# Patient Record
Sex: Female | Born: 1960
Health system: Southern US, Community
[De-identification: ages and names within clinical notes are randomized; demographics above are authoritative.]

## PROBLEM LIST (undated history)

## (undated) DIAGNOSIS — F40240 Claustrophobia: Secondary | ICD-10-CM

## (undated) DIAGNOSIS — J45909 Unspecified asthma, uncomplicated: Secondary | ICD-10-CM

## (undated) DIAGNOSIS — R112 Nausea with vomiting, unspecified: Secondary | ICD-10-CM

## (undated) DIAGNOSIS — E785 Hyperlipidemia, unspecified: Secondary | ICD-10-CM

## (undated) DIAGNOSIS — F329 Major depressive disorder, single episode, unspecified: Secondary | ICD-10-CM

## (undated) DIAGNOSIS — K219 Gastro-esophageal reflux disease without esophagitis: Secondary | ICD-10-CM

## (undated) DIAGNOSIS — J189 Pneumonia, unspecified organism: Secondary | ICD-10-CM

## (undated) DIAGNOSIS — F32A Depression, unspecified: Secondary | ICD-10-CM

## (undated) DIAGNOSIS — F419 Anxiety disorder, unspecified: Secondary | ICD-10-CM

## (undated) DIAGNOSIS — T7840XA Allergy, unspecified, initial encounter: Secondary | ICD-10-CM

## (undated) DIAGNOSIS — C50512 Malignant neoplasm of lower-outer quadrant of left female breast: Principal | ICD-10-CM

## (undated) DIAGNOSIS — IMO0001 Reserved for inherently not codable concepts without codable children: Secondary | ICD-10-CM

## (undated) DIAGNOSIS — G8929 Other chronic pain: Secondary | ICD-10-CM

## (undated) DIAGNOSIS — R51 Headache: Secondary | ICD-10-CM

## (undated) DIAGNOSIS — D649 Anemia, unspecified: Secondary | ICD-10-CM

## (undated) DIAGNOSIS — Z9889 Other specified postprocedural states: Secondary | ICD-10-CM

## (undated) DIAGNOSIS — I10 Essential (primary) hypertension: Secondary | ICD-10-CM

## (undated) HISTORY — PX: ABDOMINAL HYSTERECTOMY: SHX81

## (undated) HISTORY — DX: Hyperlipidemia, unspecified: E78.5

## (undated) HISTORY — DX: Anxiety disorder, unspecified: F41.9

## (undated) HISTORY — DX: Depression, unspecified: F32.A

## (undated) HISTORY — DX: Allergy, unspecified, initial encounter: T78.40XA

## (undated) HISTORY — DX: Claustrophobia: F40.240

## (undated) HISTORY — PX: OTHER SURGICAL HISTORY: SHX169

## (undated) HISTORY — DX: Pneumonia, unspecified organism: J18.9

## (undated) HISTORY — PX: VARICOSE VEIN SURGERY: SHX832

## (undated) HISTORY — DX: Essential (primary) hypertension: I10

## (undated) HISTORY — PX: GANGLION CYST EXCISION: SHX1691

## (undated) HISTORY — DX: Malignant neoplasm of lower-outer quadrant of left female breast: C50.512

## (undated) HISTORY — PX: TONSILLECTOMY: SUR1361

## (undated) HISTORY — DX: Other chronic pain: G89.29

## (undated) HISTORY — DX: Headache: R51

## (undated) HISTORY — DX: Major depressive disorder, single episode, unspecified: F32.9

---

## 1999-08-15 ENCOUNTER — Other Ambulatory Visit: Admission: RE | Admit: 1999-08-15 | Discharge: 1999-08-15 | Payer: Self-pay | Admitting: *Deleted

## 2000-10-27 ENCOUNTER — Encounter: Admission: RE | Admit: 2000-10-27 | Discharge: 2000-10-27 | Payer: Self-pay | Admitting: *Deleted

## 2000-11-09 ENCOUNTER — Other Ambulatory Visit: Admission: RE | Admit: 2000-11-09 | Discharge: 2000-11-09 | Payer: Self-pay | Admitting: *Deleted

## 2002-01-27 ENCOUNTER — Other Ambulatory Visit: Admission: RE | Admit: 2002-01-27 | Discharge: 2002-01-27 | Payer: Self-pay | Admitting: *Deleted

## 2002-12-21 ENCOUNTER — Encounter: Payer: Self-pay | Admitting: Gastroenterology

## 2002-12-21 ENCOUNTER — Encounter: Admission: RE | Admit: 2002-12-21 | Discharge: 2002-12-21 | Payer: Self-pay | Admitting: Gastroenterology

## 2003-02-06 ENCOUNTER — Other Ambulatory Visit: Admission: RE | Admit: 2003-02-06 | Discharge: 2003-02-06 | Payer: Self-pay | Admitting: *Deleted

## 2004-11-21 ENCOUNTER — Ambulatory Visit: Payer: Self-pay | Admitting: Internal Medicine

## 2004-12-03 ENCOUNTER — Ambulatory Visit: Payer: Self-pay | Admitting: Internal Medicine

## 2004-12-06 ENCOUNTER — Ambulatory Visit: Payer: Self-pay

## 2006-12-15 ENCOUNTER — Ambulatory Visit: Payer: Self-pay | Admitting: Internal Medicine

## 2006-12-15 DIAGNOSIS — F419 Anxiety disorder, unspecified: Secondary | ICD-10-CM | POA: Insufficient documentation

## 2006-12-15 DIAGNOSIS — N318 Other neuromuscular dysfunction of bladder: Secondary | ICD-10-CM | POA: Insufficient documentation

## 2007-06-28 ENCOUNTER — Telehealth (INDEPENDENT_AMBULATORY_CARE_PROVIDER_SITE_OTHER): Payer: Self-pay | Admitting: *Deleted

## 2007-06-30 ENCOUNTER — Ambulatory Visit: Payer: Self-pay | Admitting: Internal Medicine

## 2007-07-05 ENCOUNTER — Telehealth (INDEPENDENT_AMBULATORY_CARE_PROVIDER_SITE_OTHER): Payer: Self-pay | Admitting: *Deleted

## 2007-07-12 ENCOUNTER — Encounter (INDEPENDENT_AMBULATORY_CARE_PROVIDER_SITE_OTHER): Payer: Self-pay | Admitting: *Deleted

## 2007-07-13 ENCOUNTER — Telehealth (INDEPENDENT_AMBULATORY_CARE_PROVIDER_SITE_OTHER): Payer: Self-pay | Admitting: *Deleted

## 2007-07-19 ENCOUNTER — Encounter (INDEPENDENT_AMBULATORY_CARE_PROVIDER_SITE_OTHER): Payer: Self-pay | Admitting: *Deleted

## 2007-07-19 ENCOUNTER — Ambulatory Visit: Payer: Self-pay | Admitting: Internal Medicine

## 2007-07-19 LAB — CONVERTED CEMR LAB
OCCULT 1: NEGATIVE
OCCULT 2: NEGATIVE
OCCULT 3: NEGATIVE

## 2007-09-02 ENCOUNTER — Ambulatory Visit (HOSPITAL_BASED_OUTPATIENT_CLINIC_OR_DEPARTMENT_OTHER): Admission: RE | Admit: 2007-09-02 | Discharge: 2007-09-02 | Payer: Self-pay | Admitting: Obstetrics and Gynecology

## 2007-09-02 ENCOUNTER — Encounter (INDEPENDENT_AMBULATORY_CARE_PROVIDER_SITE_OTHER): Payer: Self-pay | Admitting: Obstetrics and Gynecology

## 2007-10-07 ENCOUNTER — Encounter (INDEPENDENT_AMBULATORY_CARE_PROVIDER_SITE_OTHER): Payer: Self-pay | Admitting: Obstetrics and Gynecology

## 2007-10-07 ENCOUNTER — Ambulatory Visit (HOSPITAL_COMMUNITY): Admission: RE | Admit: 2007-10-07 | Discharge: 2007-10-08 | Payer: Self-pay | Admitting: Obstetrics and Gynecology

## 2008-07-13 ENCOUNTER — Encounter: Payer: Self-pay | Admitting: Internal Medicine

## 2008-08-15 ENCOUNTER — Encounter (INDEPENDENT_AMBULATORY_CARE_PROVIDER_SITE_OTHER): Payer: Self-pay | Admitting: *Deleted

## 2008-08-24 ENCOUNTER — Telehealth (INDEPENDENT_AMBULATORY_CARE_PROVIDER_SITE_OTHER): Payer: Self-pay | Admitting: *Deleted

## 2008-08-30 ENCOUNTER — Encounter (INDEPENDENT_AMBULATORY_CARE_PROVIDER_SITE_OTHER): Payer: Self-pay | Admitting: *Deleted

## 2008-09-06 ENCOUNTER — Encounter: Payer: Self-pay | Admitting: Internal Medicine

## 2008-09-29 ENCOUNTER — Ambulatory Visit: Payer: Self-pay | Admitting: Internal Medicine

## 2009-10-08 ENCOUNTER — Encounter (INDEPENDENT_AMBULATORY_CARE_PROVIDER_SITE_OTHER): Payer: Self-pay | Admitting: *Deleted

## 2010-04-16 ENCOUNTER — Ambulatory Visit
Admission: RE | Admit: 2010-04-16 | Discharge: 2010-04-16 | Payer: Self-pay | Source: Home / Self Care | Attending: Internal Medicine | Admitting: Internal Medicine

## 2010-04-16 ENCOUNTER — Encounter: Payer: Self-pay | Admitting: Internal Medicine

## 2010-04-16 DIAGNOSIS — E785 Hyperlipidemia, unspecified: Secondary | ICD-10-CM | POA: Insufficient documentation

## 2010-04-16 DIAGNOSIS — R030 Elevated blood-pressure reading, without diagnosis of hypertension: Secondary | ICD-10-CM | POA: Insufficient documentation

## 2010-04-16 LAB — CONVERTED CEMR LAB
Cholesterol, target level: 200 mg/dL
HDL goal, serum: 40 mg/dL
LDL Goal: 160 mg/dL

## 2010-04-23 ENCOUNTER — Ambulatory Visit: Admit: 2010-04-23 | Payer: Self-pay | Admitting: Internal Medicine

## 2010-04-28 LAB — CONVERTED CEMR LAB
ALT: 12 units/L (ref 0–35)
AST: 20 units/L (ref 0–37)
Albumin: 3.8 g/dL (ref 3.5–5.2)
Alkaline Phosphatase: 62 units/L (ref 39–117)
BUN: 11 mg/dL (ref 6–23)
Basophils Absolute: 0 10*3/uL (ref 0.0–0.1)
Basophils Relative: 0.5 % (ref 0.0–1.0)
Bilirubin, Direct: 0.1 mg/dL (ref 0.0–0.3)
CO2: 30 meq/L (ref 19–32)
Calcium: 8.8 mg/dL (ref 8.4–10.5)
Chloride: 106 meq/L (ref 96–112)
Creatinine, Ser: 0.8 mg/dL (ref 0.4–1.2)
Eosinophils Absolute: 0.1 10*3/uL (ref 0.0–0.7)
Eosinophils Relative: 1.4 % (ref 0.0–5.0)
GFR calc Af Amer: 98 mL/min
GFR calc non Af Amer: 81 mL/min
Glucose, Bld: 88 mg/dL (ref 70–99)
HCT: 36.8 % (ref 36.0–46.0)
Hemoglobin: 11.6 g/dL — ABNORMAL LOW (ref 12.0–15.0)
Iron: 22 ug/dL — ABNORMAL LOW (ref 42–145)
Lymphocytes Relative: 30.1 % (ref 12.0–46.0)
MCHC: 31.4 g/dL (ref 30.0–36.0)
MCV: 80.7 fL (ref 78.0–100.0)
Monocytes Absolute: 0.3 10*3/uL (ref 0.1–1.0)
Monocytes Relative: 6.2 % (ref 3.0–12.0)
Neutro Abs: 2.7 10*3/uL (ref 1.4–7.7)
Neutrophils Relative %: 61.8 % (ref 43.0–77.0)
Platelets: 234 10*3/uL (ref 150–400)
Potassium: 4.8 meq/L (ref 3.5–5.1)
RBC: 4.56 M/uL (ref 3.87–5.11)
RDW: 14.4 % (ref 11.5–14.6)
Saturation Ratios: 4.4 % — ABNORMAL LOW (ref 20.0–50.0)
Sodium: 141 meq/L (ref 135–145)
Total Bilirubin: 0.6 mg/dL (ref 0.3–1.2)
Total Protein: 7.2 g/dL (ref 6.0–8.3)
Transferrin: 358 mg/dL (ref >=212.0)
WBC: 4.4 10*3/uL — ABNORMAL LOW (ref 4.5–10.5)

## 2010-05-02 NOTE — Assessment & Plan Note (Signed)
Summary: cpx/cbs   Vital Signs:  Patient profile:   50 year old female Weight:      246 pounds Temp:     98.1 degrees F oral Pulse rate:   80 / minute Resp:     16 per minute BP sitting:   120 / 88  (left arm) Cuff size:   large  Vitals Entered By: Shonna Chock CMA (April 16, 2010 1:14 PM) CC: CPX , Lipid Management   CC:  CPX  and Lipid Management.  History of Present Illness:    Jennifer Park is here for a physical; she has had gas symptoms responsive to [employer].    Hypertension Follow-Up:  The patient reports edema, but denies lightheadedness, urinary frequency, headaches, and fatigue.  The patient denies the following associated symptoms: chest pain, chest pressure, exercise intolerance, dyspnea, palpitations, and syncope.  Compliance with medications (by patient report) has been near 100%.  The patient reports that dietary compliance has been good.  The patient reports exercising 3-4X per week.  Adjunctive measures currently used by the patient include  modified salt restriction.    Lipid Management History:      Positive NCEP/ATP III risk factors include hypertension.  Negative NCEP/ATP III risk factors include female age less than 75 years old, no history of early menopause without estrogen hormone replacement, non-diabetic, no family history for ischemic heart disease, non-tobacco-user status, no ASHD (atherosclerotic heart disease), no prior stroke/TIA, no peripheral vascular disease, and no history of aortic aneurysm.     Current Medications (verified): 1)  Sertraline Hcl 100 Mg  Tabs (Sertraline Hcl) .Marland Kitchen.. 1 Once Daily 2)  Oxybutynin Chloride 5 Mg  Tabs (Oxybutynin Chloride) .Marland Kitchen.. 1 By Mouth Two Times A Day As Needed **appointment Over-Due** 3)  Excedrin Migraine 4)  Benadryl Children Prn 5)  Amlodipine Besylate 5 Mg  Tabs (Amlodipine Besylate) .Marland Kitchen.. 1 By Mouth Once Daily**office Visit Due Now*** 6)  Acyclovir 200 Mg Caps (Acyclovir) .... Take Two Tablets Per Day 7)  Probiotic  .Marland Kitchen.. 1 By Mouth Once Daily  Allergies: 1)  ! Benazepril Hcl (Benazepril Hcl) 2)  Morphine  Past History:  Past Medical History: Lactose intolerance ANXIETY STATE NOS (ICD-300.00)  , stable with Setraline DYSFUNCTION, BLADDER NEC (ICD-596.59) Hypertension Hyperlipidemia: NMR Lipoprofile 2006: LDL 100(1098/415), HDL 52, TG 70. LDL goal = <120. Framingham Study LDL goal=< 160.  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         New lesion OD lower lid 2 months ago. Psych:  Denies anxiety, depression, easily angered, easily tearful, and irritability.  Physical Exam  General:  well-nourished;alert,appropriate and cooperative throughout examination Head:  Normocephalic and atraumatic without obvious abnormalities.  Eyes:  No corneal or conjunctival inflammation noted. Perrla. Funduscopic exam benign, without hemorrhages, exudates or papilledema. Papilloma OD lower lid  Ears:  External ear exam shows no significant lesions or deformities.  Otoscopic examination reveals clear canals, tympanic membranes are intact bilaterally without bulging, retraction, inflammation or discharge. Hearing is grossly normal bilaterally. Nose:  External nasal  examination shows no deformity or inflammation. Nasal mucosa are pink and moist without lesions or exudates. Mouth:  Oral mucosa and oropharynx without lesions or exudates.  Teeth in good repair. Neck:  No deformities, masses, or tenderness noted. Lungs:  Normal respiratory effort, chest expands symmetrically. Lungs are clear to auscultation, no crackles or wheezes. Heart:  normal rate, regular rhythm, no gallop, no rub, no JVD, no HJR, and grade 1/2 - 1  /6 systolic murmur.   Abdomen:  Bowel sounds positive,abdomen soft and non-tender without masses, organomegaly or hernias noted. Genitalia:  Dr Rosalio Macadamia  Msk:  No deformity or scoliosis noted of thoracic or lumbar spine.   Pulses:  R and L carotid,radial,femoral,dorsalis pedis and posterior tibial pulses are full and equal bilaterally Extremities:  No clubbing, cyanosis, edema, or deformity noted with normal full range of motion of all joints.  Varicose veins over feet.  Neurologic:  alert & oriented X3 and DTRs symmetrical and normal.   Skin:  Intact without suspicious lesions or rashes Cervical Nodes:  No lymphadenopathy noted Axillary Nodes:  No palpable lymphadenopathy Psych:  memory intact for recent and remote, normally interactive, and good eye contact.     Impression & Recommendations:  Problem # 1:  ROUTINE GENERAL MEDICAL EXAM@HEALTH  CARE FACL (ICD-V70.0)  Orders: EKG w/ Interpretation (93000)  Problem # 2:  HYPERTENSION (ICD-401.9) Trace edema The following medications were removed from the medication list:    Amlodipine Besylate 5 Mg Tabs (Amlodipine besylate) .Marland Kitchen... 1 by mouth once daily Her updated medication list for this problem includes:    Losartan Potassium-hctz 50-12.5 Mg Tabs (Losartan potassium-hctz) .Marland Kitchen... 1 once daily  Problem # 3:  HYPERLIPIDEMIA (ICD-272.4)  Complete Medication List: 1)  Sertraline Hcl 100 Mg Tabs (Sertraline hcl) .Marland Kitchen.. 1 once daily 2)  Oxybutynin Chloride 5 Mg Tabs (Oxybutynin chloride)  .Marland Kitchen.. 1 by mouth two times a day as needed 3)  Excedrin Migraine  4)  Benadryl Children Prn  5)  Acyclovir 200 Mg Caps (Acyclovir) .... Take two tablets per day 6)  Probiotic  .Marland Kitchen.. 1 by mouth once daily 7)  Losartan Potassium-hctz 50-12.5 Mg Tabs (Losartan potassium-hctz) .Marland Kitchen.. 1 once daily  Lipid Assessment/Plan:      Based on NCEP/ATP III, the patient's risk factor category is "2 or more risk factors and a calculated 10 year CAD risk of > 20%".  The patient's lipid goals are as follows: Total cholesterol goal is 200; LDL cholesterol goal is 160; HDL cholesterol goal is 40; Triglyceride goal is 150.    Patient Instructions: 1)  Consider fasting labs: 2)  BMP ; 3)  Hepatic Panel ; 4)  Lipid Panel ; 5)  TSH ; 6)  CBC w/ Diff.  7)  Check your Blood Pressure regularly. Call if it is above: 135/85 ON AVERAGE  Prescriptions: OXYBUTYNIN CHLORIDE 5 MG  TABS (OXYBUTYNIN CHLORIDE) 1 by mouth two times a day as needed  #30 x 11   Entered and Authorized by:   Marga Melnick MD   Signed by:   Marga Melnick MD on 04/16/2010   Method used:   Print then Give to Patient   RxID:   [id number] SERTRALINE HCL 100 MG  TABS (SERTRALINE HCL) 1 once daily  #30 Tablet x 11   Entered and Authorized by:   Marga Melnick MD   Signed  by:   Marga Melnick MD on 04/16/2010   Method used:   Print then Give to Patient   RxID:   [id number] LOSARTAN POTASSIUM-HCTZ 50-12.5 MG TABS (LOSARTAN POTASSIUM-HCTZ) 1 once daily  #30 x 11   Entered and Authorized by:   Marga Melnick MD   Signed by:   Marga Melnick MD on 04/16/2010   Method used:   Print then Give to Patient   RxID:   [id number]    Orders Added: 1)  Est. Patient 40-64 years [99396] 2)  EKG w/ Interpretation [93000]   Immunization History:  Tetanus/Td Immunization History:    Tetanus/Td:  historical (03/09/2007)   Immunization History:  Tetanus/Td Immunization History:    Tetanus/Td:  Historical (03/09/2007)

## 2010-05-02 NOTE — Letter (Signed)
Summary: Primary Care Appointment Letter  [facility] [place]  [address] [place], [place] [address]   Phone: [phone]  Fax: [phone]    10/08/2009 MRN: [id number]  CALINDA STOCKINGER [address] [place], [place]  [address]  Dear Ms. Alona Bene,   Your Primary Care Physician Marga Melnick MD has indicated that:    ___X____it is time to schedule an appointment (Please call to schedule a yearly physical-30 min appointment).    _______you missed your appointment on______ and need to call and          reschedule.    _______you need to have lab work done.    _______you need to schedule an appointment discuss lab or test results.    _______you need to call to reschedule your appointment that is                       scheduled on _________.     Please call our office as soon as possible. Our phone number is [phone]. Please press option 1. Our office is open 8a-5p, Monday through Friday.     Thank you,    [facility]

## 2010-05-17 ENCOUNTER — Encounter: Payer: Self-pay | Admitting: Internal Medicine

## 2010-06-06 NOTE — Letter (Signed)
Summary: [facility]  [facility]   Imported By: Maryln Gottron 05/31/2010 14:20:37  _____________________________________________________________________  External Attachment:    Type:   Image     Comment:   External Document

## 2010-08-13 NOTE — Op Note (Signed)
Jennifer Park, Jennifer Park                  ACCOUNT NO.:  [id number]   MEDICAL RECORD NO.:  [id number]          PATIENT TYPE:  OIB   LOCATION:  9315                          FACILITY:  [facility]   PHYSICIAN:  Sherry A. Dickstein, M.D.DATE OF BIRTH:  1961-03-03   DATE OF PROCEDURE:  10/07/2007  DATE OF DISCHARGE:                               OPERATIVE REPORT   PREOPERATIVE DIAGNOSIS:  Complex atypical endometrial hyperplasia.   POSTOPERATIVE DIAGNOSIS:  Complex atypical endometrial hyperplasia.   PROCEDURE:  Total laparoscopic hysterectomy.   SURGEON:  Sherry A. Rosalio Macadamia, MD   ASSISTANT:  Chester Holstein. Earlene Plater, MD.   ANESTHESIA:  General.   INDICATIONS:  This is a 50 year old G2, P1-0-1-1 woman, who had some  irregular heavy bleeding.  The patient had a workup which included an  ultrasound/sonohysterogram showing endometrial polyps.  The patient went  to have a D&C, hysteroscopy, resectoscope, removing these polys.  Pathology returned, showing complex atypical endometrial hyperplasia.  Pathologists could not completely rule out endometrial cancer, although  no cancer was seen because of this the patient is brought to the  operating room for hysterectomy.  The patient requests retaining her  ovaries since no cancer was present.   FINDINGS:  Normal-sized anteflexed uterus sounding to 10 cm.  Normal  fallopian tubes and ovaries.   PROCEDURE:  The patient was brought into the operating room and was  given adequate general anesthesia.  She was placed in dorsal lithotomy  position.  The pelvis was irrigated with Ringer's lactate for pelvic  washings.  Her abdomen and then her perineum washed with Betadine.  Her  vagina was washed with Betadine.  Foley catheter was placed in the  vagina and a weighted speculum placed in the vagina.  The cervix was  grasped with a single-toothed tenaculum and the uterus was sounded.  The  RUMI roomy was placed in the standard fashion with 10 cm tip.  The  balloons  were inflated, surgeon's gown and gloves were changed.  The  patient was draped in sterile fashion.  The subumbilical area was  infiltrated with 0.25% Marcaine.  Incision was made.  Fascia was  identified.  Fascia was incised sharply with 0 Vicryl stitch was taken  in 4 quadrants.  The peritoneum was entered bluntly.  The Hasson was  placed into the peritoneal cavity and cinched down with the Vicryl  stitches.  The carbon dioxide was insufflated.  The pelvis was inspected  and the pictures were obtained.  Using the harmonic scalpel and the  Gyrus bipolar cautery, and the round ligaments, and utero-ovarian  ligaments were cauterized on both sides.  The anterior leaf of the broad  ligament and the anterior leaf of the bladder flap was cauterized and  cut to be able to develop a bladder flap.  Once the uterine arteries  were then well identified, they were cauterized with bipolar cautery in  many different places over a course approximately 2 cm and then cut in  the middle.  The posterior serosa at the level of uterosacral ligaments  was cauterized and cut using the  harmonic scalpel.  The cervix was  developed using blunt and sharp dissection and cautery to develop the  bladder flap off of the cervix.  Once cervix was well palpated at the  level of the cup, then the vagina was cut into at the level above the  cervix using the harmonic scalpel.  The vaginal cuff was then cut  circumferentially in this fashion right at the level of the cup and once  the lateral tissues were identified.  This was actually cut using some  bipolar cautery instead.  We using the harmonic scalpel posteriorly.  Once this was done, the entire specimen was detached from the vagina.  The specimen was attempted to be removed using the RUMI; however,  detached.  Therefore, the specimen was removed by one of the surgeon was  going vaginally and then attaching single-tooth tenaculum on the cervix  and with some pressure and  the uterus was able to be pulled into the  vagina.  Surgeon's gloves were changed and the abdomen was reinspected.  The pelvis was irrigated.  There was no significant bleeding present.  Using Vicryl ratcheted suture, the angles of the vagina were first  closed and then using the same suture the vagina was closed in a running  stitch.  Adequate closure was present.  The distance between the  stitches was checked and then no probe could be passed between the  stitches.  Adequate hemostasis was present.  The pelvis was irrigated.  The ovaries were reinspected and felt to be normal.  There had been a  small amount of endometriosis present in the cul-de-sac but this was not  felt to be significant.  All cauterized areas for major blood vessels  were reinspected and there was no significant bleeding.  The pelvis was  irrigated.  All carbon dioxide was allowed to escape after the ports had  been removed.  The subumbilical fascia was closed around the digit to  make sure that no bowel was trapped on closure.  The subumbilical skin  was closed with 4-0 Vicryl in subcuticular running stitch.  Three  incisions that had been placed, 2 had been placed in the initial part of  the surgery on the right hand side of the abdomen under direct  visualization with 5 mm ports and 1 had been placed on the left side of  the abdomen under direct visualization with a 5 mm port, those 2 were  closed with Dermabond.  There was felt that adequate hemostasis was  present everywhere.  The patient was taken out of dorsal lithotomy  position after removing the uterus from the vagina.  The patient was  awakened.  She was extubated.  She was moved from the operating table to  a stretcher in stable condition.  Complications were none.  Estimated  blood loss less than 100 mL.  Specimen was uterus with cervix present.  This went to pathology.      Sherry A. Rosalio Macadamia, M.D.  Electronically Signed     SAD/MEDQ  D:   10/07/2007  T:  10/08/2007  Job:  [id number]

## 2010-08-13 NOTE — Op Note (Signed)
NAMECYPRESS, HINKSON                  ACCOUNT NO.:  [id number]   MEDICAL RECORD NO.:  [id number]          PATIENT TYPE:  AMB   LOCATION:  [facility]                         FACILITY:  [facility]   PHYSICIAN:  Sherry A. Dickstein, M.D.DATE OF BIRTH:  1960/08/14   DATE OF PROCEDURE:  09/02/2007  DATE OF DISCHARGE:                               OPERATIVE REPORT   PREOPERATIVE DIAGNOSES:  1. Menorrhagia.  2. Submucosal fibroids.   POSTOPERATIVE DIAGNOSES:  1. Menorrhagia.  2. Submucosal fibroids.  3. Endometrial polyps.   PROCEDURE:  Dilatation and curettage hysteroscopy with resectoscope.   SURGEON:  Sherry A. Rosalio Macadamia, M.D.   ANESTHESIA:  MAC.   INDICATIONS:  This is a 50 year old G2, P1-0-1-1, woman who has monthly  menstrual cycles which are excessively heavy.  The patient has large  clots and she bleeds for 3-6 days, 3 of them excessively heavy.  The  patient had an ultrasound which revealed a thickened endometrium that  she had a repeat a sonohysterogram which showed 2 submucosal fibroids  present.  Because of this the patient is brought to the operating room  for [facility] hysteroscopy with resectoscope.   FINDINGS:  A slightly enlarged, anteflexed uterus.  No adnexal mass.  Multiple endometrial polyps and several submucosal fibroids.   PROCEDURE:  The patient was brought to the operating room and given  adequate IV sedation.  She was placed in a dorsal lithotomy position.  Her perineum was washed with Betadine prep, pelvic examination was  performed.  Surgeon's gown and gloves were changed.  The patient was  draped in sterile fashion.  Speculum was placed within the vagina.  Vagina was washed with Betadine.  Paracervical block was administered  with 1% Nesacaine.  Anterior lip of the cervix was grasped with a single-  tooth tenaculum.  Cervix was sounded.  Cervix was dilated with Pratt  dilators up to a #33.  Hysteroscope was introduced into the endometrial  cavity.  Pictures were  obtained.  Using a single-loop right angle  resector, first some endometrial polyps were removed and then the  submucosal fibroids were removed.  The endometrial resections were  followed to clean out the entire endometrial cavity.  Once this was done  circumferentially with adequate hemostasis obtained, all instruments  were removed from the vagina.  Pictures were obtained before and after  surgery in the endometrial cavity.  The speculum was replaced within the  vagina to make sure that adequate hemostasis was obtained.  That was  removed.  The patient was taken out of the dorsal lithotomy position.  She was moved from the operating table to a stretcher in stable  condition.   COMPLICATIONS:  None.   ESTIMATED BLOOD LOSS:  Less than 5 mL.   SORBITOL DIFFERENTIAL:  Minus 115 mL.   SPECIMEN:  Endometrial polyps and submucosal fibroids with endometrial  resections.      Sherry A. Rosalio Macadamia, M.D.  Electronically Signed     SAD/MEDQ  D:  09/02/2007  T:  09/02/2007  Job:  [id number]

## 2010-12-26 LAB — POCT I-STAT, CHEM 8
Calcium, Ion: 1.19
Glucose, Bld: 89
HCT: 36
Hemoglobin: 12.2
TCO2: 25

## 2010-12-26 LAB — CBC
Hemoglobin: 11.2 — ABNORMAL LOW
Platelets: 269
RBC: 4.02
RDW: 15.9 — ABNORMAL HIGH
RDW: 16.2 — ABNORMAL HIGH
WBC: 6.3

## 2010-12-26 LAB — POCT PREGNANCY, URINE
Operator id: 268271
Operator id: 268271
Preg Test, Ur: NEGATIVE

## 2011-01-16 ENCOUNTER — Ambulatory Visit (INDEPENDENT_AMBULATORY_CARE_PROVIDER_SITE_OTHER): Payer: PRIVATE HEALTH INSURANCE | Admitting: Internal Medicine

## 2011-01-16 ENCOUNTER — Encounter: Payer: Self-pay | Admitting: Internal Medicine

## 2011-01-16 VITALS — BP 116/80 | HR 103 | Temp 97.7°F | Wt 243.0 lb

## 2011-01-16 DIAGNOSIS — R351 Nocturia: Secondary | ICD-10-CM

## 2011-01-16 DIAGNOSIS — R32 Unspecified urinary incontinence: Secondary | ICD-10-CM

## 2011-01-16 DIAGNOSIS — F980 Enuresis not due to a substance or known physiological condition: Secondary | ICD-10-CM

## 2011-01-16 DIAGNOSIS — R238 Other skin changes: Secondary | ICD-10-CM

## 2011-01-16 LAB — CBC WITH DIFFERENTIAL/PLATELET
Basophils Relative: 0.5 % (ref 0.0–3.0)
Eosinophils Absolute: 0.1 10*3/uL (ref 0.0–0.7)
HCT: 44.5 % (ref 36.0–46.0)
Hemoglobin: 14.9 g/dL (ref 12.0–15.0)
Lymphocytes Relative: 26.2 % (ref 12.0–46.0)
MCHC: 33.4 g/dL (ref 30.0–36.0)
MCV: 88.9 fl (ref 78.0–100.0)
Neutro Abs: 3.8 10*3/uL (ref 1.4–7.7)
RBC: 5.01 Mil/uL (ref 3.87–5.11)

## 2011-01-16 LAB — PROTIME-INR: Prothrombin Time: 10.1 s — ABNORMAL LOW (ref 10.2–12.4)

## 2011-01-16 MED ORDER — FESOTERODINE FUMARATE ER 4 MG PO TB24: 4.0000 mg | ORAL_TABLET | Freq: Every day | ORAL | Status: DC

## 2011-01-16 NOTE — Progress Notes (Signed)
Subjective:    Patient ID: Jennifer Park, female    DOB: [birth year], 50 y.o.   MRN: [id number]  HPI #1 easy bruising Onset: 3 mos ago Trigger: no except trauma from pet jumping on her; BP cuff also caused it Associated signs: She denies epistaxis, hemoptysis, hematemesis, melena, rectal bleeding, or hematuria. Family/past medical history: She has a history of anemia due to dysfunctional menses. This resolved following her hysterectomy. There is no personal or family history of bleeding or clotting dyscrasias.  #2 Nocturia & incontinence: Onset: over 6 mos    Worsening: initially nocturia X 2, now 3-4. Enuresis X 10 in past 6 mos Symptoms Urgency: yes  Frequency: no  Hesitancy: yes,   Flank Pain: no  Fever: no    Nausea/Vomiting: no  Discharge: no  Recent Antibiotic Usage (last 30 days): no  More than 3 UTI's last 12 months:no 1. DM: no 2. Renal Disease/Calculi: no 3. Urinary Tract Abnormality: no  4. Instrumentation: yes, Urology evaluation in [facility] 2002 Rx: Ditropan in 2002 ,  Ineffective for 6 weeks    Review of Systems     Objective:   Physical Exam Gen.: Healthy and well-nourished in appearance. Alert, appropriate and cooperative throughout exam.  Eyes: No corneal or conjunctival inflammation noted. No icterus Mouth: Oral mucosa and oropharynx reveal no lesions or exudates. Teeth in good repair. Neck: No deformities, masses, or tenderness noted. Thyroid normal. Lungs: Normal respiratory effort; chest expands symmetrically. Lungs are clear to auscultation without rales, wheezes, or increased work of breathing. Heart: Normal rate and rhythm. Normal S1 and S2. No gallop, click, or rub. No murmur. Abdomen: Bowel sounds normal; abdomen soft and nontender. No masses, organomegaly or hernias noted.                                                                                   Musculoskeletal/extremities: No clubbing, cyanosis, edema, or deformity noted. Joints normal.  Nail health  good. Vascular: Carotid, radial artery, dorsalis pedis and  posterior tibial pulses are full and equal. No bruits present. Neurologic: Alert and oriented x3. Deep tendon reflexes symmetrical and normal.          Skin: Intact without suspicious lesions or rashes. Lymph: No cervical, axillary  lymphadenopathy present. Psych: Mood and affect are normal. Normally interactive                                                                                         Assessment & Plan:  #1 easy bruising  #2 past history of anemia related to dysfunctional menses; resolution with hysterectomy  #3 bladder dysfunction with incontinence, enuresis, and nocturia. . She has a friend with similar symptoms which responded to Gala Murdoch; this will be initiated as a trial. If there is not dramatic response, the urology evaluation should be repeated.

## 2011-01-16 NOTE — Patient Instructions (Signed)
.  Share results with [facility]. Please obtain results of prior Urology evaluation from [facility]

## 2011-01-27 ENCOUNTER — Other Ambulatory Visit: Payer: Self-pay | Admitting: Internal Medicine

## 2011-01-27 DIAGNOSIS — R351 Nocturia: Secondary | ICD-10-CM

## 2011-01-27 DIAGNOSIS — F980 Enuresis not due to a substance or known physiological condition: Secondary | ICD-10-CM

## 2011-01-27 DIAGNOSIS — R32 Unspecified urinary incontinence: Secondary | ICD-10-CM

## 2011-01-27 MED ORDER — FESOTERODINE FUMARATE ER 4 MG PO TB24: 4.0000 mg | ORAL_TABLET | Freq: Every day | ORAL | Status: DC

## 2011-01-27 NOTE — Telephone Encounter (Signed)
RX sent

## 2011-04-10 ENCOUNTER — Other Ambulatory Visit: Payer: Self-pay | Admitting: Internal Medicine

## 2011-04-26 ENCOUNTER — Other Ambulatory Visit: Payer: Self-pay | Admitting: Internal Medicine

## 2011-07-26 ENCOUNTER — Other Ambulatory Visit: Payer: Self-pay | Admitting: Internal Medicine

## 2011-10-23 ENCOUNTER — Other Ambulatory Visit: Payer: Self-pay | Admitting: Internal Medicine

## 2012-01-26 ENCOUNTER — Other Ambulatory Visit: Payer: Self-pay | Admitting: Internal Medicine

## 2012-03-05 ENCOUNTER — Other Ambulatory Visit: Payer: Self-pay | Admitting: Internal Medicine

## 2012-03-08 NOTE — Telephone Encounter (Signed)
Patient needs to schedule a CPX  

## 2012-03-27 ENCOUNTER — Other Ambulatory Visit: Payer: Self-pay | Admitting: Internal Medicine

## 2012-03-29 NOTE — Telephone Encounter (Signed)
Pt has not been seen within a year. OK to refill? 

## 2012-03-29 NOTE — Telephone Encounter (Signed)
Left detailed msg on pt's vmail she is due for an OV. No further refills with OV.  Refill done.

## 2012-03-29 NOTE — Telephone Encounter (Signed)
Okay #30, no refills. Has not been seen in a year, needs office visit with PCP, please arrange

## 2012-04-08 ENCOUNTER — Other Ambulatory Visit: Payer: Self-pay | Admitting: Internal Medicine

## 2012-04-13 ENCOUNTER — Other Ambulatory Visit: Payer: Self-pay | Admitting: Internal Medicine

## 2012-04-14 ENCOUNTER — Encounter: Payer: PRIVATE HEALTH INSURANCE | Admitting: Internal Medicine

## 2012-04-14 DIAGNOSIS — Z0289 Encounter for other administrative examinations: Secondary | ICD-10-CM

## 2012-05-14 ENCOUNTER — Other Ambulatory Visit: Payer: Self-pay | Admitting: Internal Medicine

## 2012-06-09 ENCOUNTER — Ambulatory Visit (INDEPENDENT_AMBULATORY_CARE_PROVIDER_SITE_OTHER): Payer: PRIVATE HEALTH INSURANCE | Admitting: Internal Medicine

## 2012-06-09 ENCOUNTER — Encounter: Payer: Self-pay | Admitting: Internal Medicine

## 2012-06-09 VITALS — BP 130/78 | HR 69 | Temp 97.9°F | Resp 14 | Ht 69.03 in | Wt 246.0 lb

## 2012-06-09 DIAGNOSIS — Z0000 Encounter for general adult medical examination without abnormal findings: Secondary | ICD-10-CM

## 2012-06-09 DIAGNOSIS — Z1211 Encounter for screening for malignant neoplasm of colon: Secondary | ICD-10-CM

## 2012-06-09 MED ORDER — OXYBUTYNIN CHLORIDE 5 MG PO TABS: ORAL_TABLET | ORAL | Status: DC

## 2012-06-09 MED ORDER — SERTRALINE HCL 100 MG PO TABS: ORAL_TABLET | ORAL | Status: DC

## 2012-06-09 NOTE — Progress Notes (Signed)
  Subjective:    Patient ID: Jennifer Park, female    DOB: Feb 15, 1961, 52 y.o.   MRN: [id number]  HPI  Mrs. Pinder is here for a physical;acute issues include stress due to loss of her father.     Review of Systems  She has been on Toviaz as well as Ditropan for bladder dysfunction. This is manifested as some urgency and some urge incontinence. She denies dysuria, pyuria, or hematuria.Stress aggravates these symptoms. Before starting the Toviaz she experienced nocturia 3-4 times per night     Objective:   Physical Exam Gen.: Well-nourished in appearance. Alert, appropriate and cooperative throughout exam. Head: Normocephalic without obvious abnormalities  Eyes: No corneal or conjunctival inflammation noted. Pupils equal round reactive to light and accommodation.  Extraocular motion intact. Vision grossly normal without  lenses Ears: External  ear exam reveals no significant lesions or deformities. Wax on R ; L TM normal. Hearing is grossly normal bilaterally. Nose: External nasal exam reveals no deformity or inflammation. Nasal mucosa are pink and moist. No lesions or exudates noted.   Mouth: Oral mucosa and oropharynx reveal no lesions or exudates. Teeth in good repair. Neck: No deformities, masses, or tenderness noted. Range of motion & Thyroid normal. Lungs: Normal respiratory effort; chest expands symmetrically. Lungs are clear to auscultation without rales, wheezes, or increased work of breathing. Heart: Normal rate and rhythm. Normal S1 and S2. No gallop, click, or rub.S4 w/o murmur. Abdomen: Bowel sounds normal; abdomen soft and nontender. No masses, organomegaly or hernias noted. Genitalia: As per Gyn                                  Musculoskeletal/extremities: Slightly accentuated curvature of upper thoracic spine. No clubbing, cyanosis, edema, or significant extremity  deformity noted. Range of motion normal .Tone & strength  Normal. Joints normal. Nail health good. Able to lie  down & sit up w/o help. Negative SLR bilaterally Vascular: Carotid, radial artery, dorsalis pedis and  posterior tibial pulses are full and equal. No bruits present. Neurologic: Alert and oriented x3. Deep tendon reflexes symmetrical and normal.         Skin: Intact without suspicious lesions or rashes. Lymph: No cervical, axillary lymphadenopathy present. Psych: Mood and affect are normal. Normally interactive                                                                                        Assessment & Plan:  #1 comprehensive physical exam; no acute findings  Plan: see Orders  & Recommendations

## 2012-06-09 NOTE — Patient Instructions (Addendum)
Preventive Health Care: Exercise  30-45  minutes a day, 3-4 days a week. Walking is especially valuable in preventing Osteoporosis. Eat a low-fat diet with lots of fruits and vegetables, up to 7-9 servings per day. This would eliminate need for vitamin supplements for most individuals. Consume less than 30 grams of sugar per day from foods & drinks with High Fructose Corn Syrup as #2,3 or #4 on label.  If you activate the  My Chart system; lab & Xray results will be released directly  to you as soon as I review & address these through the computer. As per [place] all records must be reviewed and signed off within 72 hours; but I attempt to complete this within 36 hours unless I have no access to the electronic medical record.If I wait more than 36 hours the volume of reports becomes difficult to manage optimally. If you choose not to sign up for My Chart within 36 hours of labs being drawn; results will be reviewed & interpretation added before being copied & mailed, causing a delay in getting the results to you.If you do not receive that report within 7-10 days ,please call. Additionally you can use this system to gain direct  access to your records  if  out of town or @ an office of a  physician who is not in  the My Chart network.  This improves continuity of care & places you in control of your medical record.

## 2012-06-22 ENCOUNTER — Telehealth: Payer: Self-pay | Admitting: Internal Medicine

## 2012-06-22 NOTE — Telephone Encounter (Signed)
Patient requests that I call her back next week.

## 2012-06-22 NOTE — Telephone Encounter (Signed)
Message copied by Marshell Garfinkel on Tue Jun 22, 2012 12:38 PM ------      Message from: Pecola Lawless      Created: Mon Jun 14, 2012 10:32 AM       Please  schedule fasting Labs : BMET,Lipids, hepatic panel, CBC & dif, TSH. V70.0.      ----- Message -----         From: SYSTEM         Sent: 06/14/2012  12:05 AM           To: Pecola Lawless, MD                   ------

## 2012-07-02 NOTE — Progress Notes (Signed)
Pt scheduled for 07/07/12.

## 2012-07-07 ENCOUNTER — Other Ambulatory Visit (INDEPENDENT_AMBULATORY_CARE_PROVIDER_SITE_OTHER): Payer: PRIVATE HEALTH INSURANCE

## 2012-07-07 DIAGNOSIS — Z0000 Encounter for general adult medical examination without abnormal findings: Secondary | ICD-10-CM

## 2012-07-07 LAB — HEPATIC FUNCTION PANEL
Albumin: 3.8 g/dL (ref 3.5–5.2)
Bilirubin, Direct: 0 mg/dL (ref 0.0–0.3)
Total Protein: 7.1 g/dL (ref 6.0–8.3)

## 2012-07-07 LAB — CBC WITH DIFFERENTIAL/PLATELET
Basophils Relative: 0.5 % (ref 0.0–3.0)
Eosinophils Relative: 1.3 % (ref 0.0–5.0)
Hemoglobin: 13.8 g/dL (ref 12.0–15.0)
Lymphocytes Relative: 25.7 % (ref 12.0–46.0)
Monocytes Relative: 4.5 % (ref 3.0–12.0)
Neutro Abs: 3.1 10*3/uL (ref 1.4–7.7)
RBC: 4.89 Mil/uL (ref 3.87–5.11)
WBC: 4.6 10*3/uL (ref 4.5–10.5)

## 2012-07-07 LAB — BASIC METABOLIC PANEL
GFR: 78.51 mL/min (ref >60.00)
Glucose, Bld: 81 mg/dL (ref 70–99)
Potassium: 3.7 mEq/L (ref 3.5–5.1)
Sodium: 140 mEq/L (ref 135–145)

## 2012-07-07 LAB — LIPID PANEL
Cholesterol: 201 mg/dL — ABNORMAL HIGH (ref 0–200)
HDL: 36.9 mg/dL — ABNORMAL LOW (ref >39.00)
VLDL: 25 mg/dL (ref 0.0–40.0)

## 2012-07-13 ENCOUNTER — Encounter: Payer: Self-pay | Admitting: *Deleted

## 2012-07-14 ENCOUNTER — Telehealth: Payer: Self-pay | Admitting: Internal Medicine

## 2012-07-14 MED ORDER — FESOTERODINE FUMARATE ER 4 MG PO TB24: ORAL_TABLET | ORAL | Status: DC

## 2012-07-14 NOTE — Telephone Encounter (Signed)
PT called and stated that dr hopper was going to write her a rx for Toviaz on visit 06/09/2012 but her pharmacy has not received it yet. Pharmacy [facility] [address] thanks

## 2012-07-14 NOTE — Telephone Encounter (Signed)
RX sent

## 2012-08-27 ENCOUNTER — Encounter: Payer: Self-pay | Admitting: Internal Medicine

## 2012-09-03 ENCOUNTER — Telehealth: Payer: Self-pay | Admitting: Internal Medicine

## 2012-09-03 NOTE — Telephone Encounter (Signed)
In reference to GI referral entered 06/09/12, patient was contacted and left multiple messages by [facility], they also mailed her a letter, as did I.  As of today, patient will not respond.

## 2012-09-04 ENCOUNTER — Encounter: Payer: Self-pay | Admitting: Internal Medicine

## 2012-09-04 ENCOUNTER — Other Ambulatory Visit: Payer: Self-pay | Admitting: Internal Medicine

## 2012-09-04 NOTE — Telephone Encounter (Signed)
See letter.

## 2012-09-22 ENCOUNTER — Other Ambulatory Visit: Payer: Self-pay | Admitting: Internal Medicine

## 2012-09-22 NOTE — Telephone Encounter (Signed)
Refill done.  

## 2013-01-02 ENCOUNTER — Ambulatory Visit (INDEPENDENT_AMBULATORY_CARE_PROVIDER_SITE_OTHER): Payer: PRIVATE HEALTH INSURANCE | Admitting: Physician Assistant

## 2013-01-02 ENCOUNTER — Ambulatory Visit: Payer: PRIVATE HEALTH INSURANCE

## 2013-01-02 VITALS — BP 110/74 | HR 83 | Temp 98.1°F | Resp 16 | Ht 68.0 in | Wt 246.0 lb

## 2013-01-02 DIAGNOSIS — R05 Cough: Secondary | ICD-10-CM

## 2013-01-02 DIAGNOSIS — R0981 Nasal congestion: Secondary | ICD-10-CM

## 2013-01-02 DIAGNOSIS — J189 Pneumonia, unspecified organism: Secondary | ICD-10-CM

## 2013-01-02 DIAGNOSIS — R5381 Other malaise: Secondary | ICD-10-CM

## 2013-01-02 DIAGNOSIS — R059 Cough, unspecified: Secondary | ICD-10-CM

## 2013-01-02 DIAGNOSIS — J3489 Other specified disorders of nose and nasal sinuses: Secondary | ICD-10-CM

## 2013-01-02 LAB — POCT CBC
Granulocyte percent: 74.2 %G (ref 37–80)
HCT, POC: 46.4 % (ref 37.7–47.9)
Hemoglobin: 14.4 g/dL (ref 12.2–16.2)
Lymph, poc: 1.3 (ref 0.6–3.4)
MCH, POC: 28.5 pg (ref 27–31.2)
MCHC: 31 g/dL — AB (ref 31.8–35.4)
MCV: 91.8 fL (ref 80–97)
MID (cbc): 0.4 (ref 0–0.9)
MPV: 8.3 fL (ref 0–99.8)
POC Granulocyte: 4.8 (ref 2–6.9)
POC LYMPH PERCENT: 19.5 %L (ref 10–50)
POC MID %: 6.3 %M (ref 0–12)
Platelet Count, POC: 182 10*3/uL (ref 142–424)
RBC: 5.05 M/uL (ref 4.04–5.48)
RDW, POC: 14.3 %
WBC: 6.5 10*3/uL (ref 4.6–10.2)

## 2013-01-02 MED ORDER — BENZONATATE 100 MG PO CAPS: 100.0000 mg | ORAL_CAPSULE | Freq: Three times a day (TID) | ORAL | Status: DC | PRN

## 2013-01-02 MED ORDER — LEVOFLOXACIN 500 MG PO TABS: 500.0000 mg | ORAL_TABLET | Freq: Every day | ORAL | Status: DC

## 2013-01-02 NOTE — Progress Notes (Signed)
  Subjective:    Patient ID: Jennifer Park, female    DOB: April 28, 1960, 52 y.o.   MRN: [id number]  Cough Associated symptoms include postnasal drip and rhinorrhea. Pertinent negatives include no chills, ear pain, fever, headaches, sore throat, shortness of breath or wheezing.   52 year old female presents for evaluation cough x 2 weeks.  States symptoms started as flu-like with fever, chills, fatigue, nasal congestion, and cough.  Patient admits she "felt like she got hit by a bus."  Improved for a few days but did continue to have productive cough, chest tightness, and fatigue.  Is afebrile now. Denies sore throat, sinus pain, headache, otalgia, hemoptysis, SOB, or wheezing.  She has been taking Dayquil/Nyquil which usually works well for her.   Has started a new job and is concerned about an upcoming trip.   She is otherwise doing well with no other concerns today.     Review of Systems  Constitutional: Negative for fever and chills.  HENT: Positive for congestion, rhinorrhea and postnasal drip. Negative for ear pain, sore throat, sinus pressure and ear discharge.   Respiratory: Positive for cough and chest tightness. Negative for shortness of breath and wheezing.   Neurological: Negative for dizziness and headaches.       Objective:   Physical Exam  Constitutional: She is oriented to person, place, and time. She appears well-developed and well-nourished.  HENT:  Head: Normocephalic and atraumatic.  Right Ear: Hearing, tympanic membrane, external ear and ear canal normal.  Left Ear: Hearing, tympanic membrane, external ear and ear canal normal.  Mouth/Throat: Uvula is midline, oropharynx is clear and moist and mucous membranes are normal.  Eyes: Conjunctivae are normal.  Neck: Normal range of motion. Neck supple.  Cardiovascular: Normal rate, regular rhythm and normal heart sounds.   Pulmonary/Chest: Effort normal. She has rales (at bases).  Lymphadenopathy:    She has no cervical  adenopathy.  Neurological: She is alert and oriented to person, place, and time.  Psychiatric: She has a normal mood and affect. Her behavior is normal. Judgment and thought content normal.     [facility] reading (PRIMARY) by  Dr. Milus Glazier as right middle lobe pneumonia.       Assessment & Plan:   Cough - Plan: DG Chest 2 View, POCT CBC, benzonatate (TESSALON) 100 MG capsule  Nasal congestion  Other malaise and fatigue  Pneumonia, organism unspecified - Plan: levofloxacin (LEVAQUIN) 500 MG tablet  CAP - start Levaquin 500 mg daily x 7 days Tessalon perles qhs prn cough. Ok to continue Nyquil.  Patient declined rx for Hycodan due to nausea with narcotic pain medications Increase fluids and rest If symptoms significantly improved in 24 hours, ok to return to work on 01/04/13; otherwise recheck here on 01/05/13.  RTC sooner if symptoms worsening or if fever >100, SOB, chest pain, or dizziness occur.

## 2013-01-07 ENCOUNTER — Telehealth: Payer: Self-pay | Admitting: Radiology
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
## 2013-01-25 ENCOUNTER — Other Ambulatory Visit: Payer: Self-pay | Admitting: Internal Medicine

## 2013-01-25 NOTE — Telephone Encounter (Signed)
Sertraline refill sent to pharmacy.

## 2013-02-23 ENCOUNTER — Other Ambulatory Visit: Payer: Self-pay | Admitting: Internal Medicine

## 2013-02-25 NOTE — Telephone Encounter (Signed)
Oxybutynin refilled.

## 2013-03-11 ENCOUNTER — Ambulatory Visit (INDEPENDENT_AMBULATORY_CARE_PROVIDER_SITE_OTHER): Payer: PRIVATE HEALTH INSURANCE | Admitting: Internal Medicine

## 2013-03-11 VITALS — BP 148/92 | HR 73 | Temp 98.8°F | Resp 18 | Wt 249.0 lb

## 2013-03-11 DIAGNOSIS — J019 Acute sinusitis, unspecified: Secondary | ICD-10-CM

## 2013-03-11 MED ORDER — BENZONATATE 100 MG PO CAPS: 100.0000 mg | ORAL_CAPSULE | Freq: Two times a day (BID) | ORAL | Status: DC | PRN

## 2013-03-11 MED ORDER — AMOXICILLIN 500 MG PO CAPS: 1000.0000 mg | ORAL_CAPSULE | Freq: Two times a day (BID) | ORAL | Status: AC

## 2013-03-11 NOTE — Progress Notes (Signed)
Subjective:  This chart was scribed for Jennifer Sia, MD by Andrew Au, ED Scribe. This patient was seen in room 12 and the patient's care was started at 6:41  Patient ID: Jennifer Park, female    DOB: 24-Nov-1960, 52 y.o.   MRN: [id number]  HPI This chart was scribed for Jennifer Park by Andrew Au, Scribe. This patient was seen in room 12 and the patient's care was started at 6:41 PM.  HPI Comments: Jennifer Park is a 52 y.o. female who presents to the [facility] complaining of a constant, gradually worsening, harsh cough, onset 5 days ago. She was seen a month ago for similar symptoms and was prescribed Zoloft and had no relief from her symptoms. She states her cough has worsened. She reports the cough started with a sore throat.  She reports associated nasal congestion and fatigue. Pt reports nasal congestion worsen when lying down.    Past Medical History  Diagnosis Date  . Anxiety     situational  . Hyperlipidemia   . Hypertension    Past Surgical History  Procedure Laterality Date  . Ganglion cyst excision    . Abdominal hysterectomy      for abnormal cytology; Dr Rosalio Macadamia  . Tonsillectomy     Family History  Problem Relation Age of Onset  . Bipolar disorder Mother   . Breast cancer Mother   . Diabetes Father   . Kidney failure Father   . Heart failure Father   . COPD Father   . Bipolar disorder Sister   . Heart attack Maternal Grandfather     in late 43s  . Stroke Maternal Grandmother     in 90s   History   Social History  . Marital Status: Married    Spouse Name: N/A    Number of Children: N/A  . Years of Education: N/A   Occupational History  . Not on file.   Social History Main Topics  . Smoking status: Former Smoker    Quit date: 03/31/1984  . Smokeless tobacco: Not on file     Comment: age [age] , up to 1 ppd  . Alcohol Use: 2.4 oz/week    4 Glasses of wine per week  . Drug Use: No  . Sexual Activity: Not on file    Other Topics Concern  . Not on file   Social History Narrative  . No narrative on file   Allergies  Allergen Reactions  . Benazepril Hcl     REACTION: dry cough  . Morphine     REACTION: vomiting there is aquestion of this   Patient Active Problem List   Diagnosis Date Noted  . HYPERLIPIDEMIA 04/16/2010  . HYPERTENSION 04/16/2010  . ANXIETY STATE NOS 12/15/2006  . DYSFUNCTION, BLADDER NEC 12/15/2006   No orders of the defined types were placed in this encounter.    Review of Systems  Constitutional: Positive for fatigue. Negative for fever and chills.  HENT: Positive for congestion. Negative for postnasal drip and rhinorrhea.   Respiratory: Positive for cough.   Gastrointestinal: Negative for nausea.       Objective:   Physical Exam  Nursing note and vitals reviewed. Constitutional: She is oriented to person, place, and time. She appears well-developed and well-nourished. No distress.  HENT:  Head: Normocephalic and atraumatic.  Right Ear: External ear normal.  Left Ear: External ear normal.  Nares boggy with purulent d/c thr sl red w/out exud  Eyes:  Conjunctivae are normal. Right eye exhibits no discharge. Left eye exhibits no discharge.  Neck: Normal range of motion.  Cardiovascular: Normal rate and regular rhythm.   Pulmonary/Chest: Effort normal and breath sounds normal. No respiratory distress.  Musculoskeletal: She exhibits no edema.  Lymphadenopathy:    She has no cervical adenopathy.  Neurological: She is alert and oriented to person, place, and time.  Skin: No rash noted.  Psychiatric: She has a normal mood and affect.    Triage Vitals-BP 148/92  Pulse 73  Temp(Src) 98.8 F (37.1 C) (Oral)  Resp 18  Wt 249 lb (112.946 kg)  SpO2 98%      Assessment & Plan:  I have completed the patient encounter in its entirety as documented by the scribe, with editing by me where necessary. Elianne Gubser P. Merla Riches, M.D. Acute sinusitis, unspecified  Meds  ordered this encounter  Medications  . amoxicillin (AMOXIL) 500 MG capsule    Sig: Take 2 capsules (1,000 mg total) by mouth 2 (two) times daily.    Dispense:  40 capsule    Refill:  0  . benzonatate (TESSALON) 100 MG capsule    Sig: Take 1 capsule (100 mg total) by mouth 2 (two) times daily as needed for cough.    Dispense:  20 capsule    Refill:  0

## 2013-04-12 ENCOUNTER — Other Ambulatory Visit: Payer: Self-pay | Admitting: Internal Medicine

## 2013-04-13 NOTE — Telephone Encounter (Signed)
Oxybutynin refilled per protocol. JG//CMA

## 2013-05-19 ENCOUNTER — Other Ambulatory Visit: Payer: Self-pay | Admitting: *Deleted

## 2013-05-19 MED ORDER — SERTRALINE HCL 100 MG PO TABS: 100.0000 mg | ORAL_TABLET | Freq: Every day | ORAL | Status: DC

## 2013-05-19 NOTE — Telephone Encounter (Signed)
Rx sent to the pharmacy by e-script.//AB/CMA 

## 2013-05-20 ENCOUNTER — Other Ambulatory Visit: Payer: Self-pay | Admitting: *Deleted

## 2013-05-20 NOTE — Telephone Encounter (Signed)
Rx was filled on (05-19-13).//AB/CMA

## 2013-07-18 ENCOUNTER — Other Ambulatory Visit: Payer: Self-pay

## 2013-07-18 MED ORDER — OXYBUTYNIN CHLORIDE 5 MG PO TABS: ORAL_TABLET | ORAL | Status: DC

## 2013-08-29 ENCOUNTER — Other Ambulatory Visit: Payer: Self-pay

## 2013-08-29 MED ORDER — SERTRALINE HCL 100 MG PO TABS: 100.0000 mg | ORAL_TABLET | Freq: Every day | ORAL | Status: DC

## 2013-08-29 NOTE — Telephone Encounter (Signed)
#  30; needs OV before any more refills

## 2013-08-29 NOTE — Telephone Encounter (Signed)
Last office visit with you on 06/09/2012 Med last filled on 05/19/2013 #90

## 2013-10-23 ENCOUNTER — Other Ambulatory Visit: Payer: Self-pay | Admitting: Internal Medicine

## 2013-11-03 ENCOUNTER — Telehealth: Payer: Self-pay | Admitting: Internal Medicine

## 2013-11-03 MED ORDER — SERTRALINE HCL 50 MG PO TABS: 50.0000 mg | ORAL_TABLET | Freq: Every day | ORAL | Status: DC

## 2013-11-03 NOTE — Telephone Encounter (Signed)
Pt was wondering  If Dr. Linna Darner would call her in Sertraline to [facility] on [address] until she come in for the appt on 11/09/13. Please advise, pt is out of this med. Ok to leave detail massage if no answer.

## 2013-11-03 NOTE — Telephone Encounter (Signed)
As out should restart as 50 mg qd #30.

## 2013-11-09 ENCOUNTER — Encounter: Payer: PRIVATE HEALTH INSURANCE | Admitting: Internal Medicine

## 2013-11-17 ENCOUNTER — Encounter: Payer: Self-pay | Admitting: Internal Medicine

## 2013-11-17 ENCOUNTER — Ambulatory Visit (INDEPENDENT_AMBULATORY_CARE_PROVIDER_SITE_OTHER): Payer: Managed Care, Other (non HMO) | Admitting: Internal Medicine

## 2013-11-17 VITALS — BP 120/86 | HR 72 | Temp 98.0°F | Ht 68.0 in | Wt 260.4 lb

## 2013-11-17 DIAGNOSIS — Z0000 Encounter for general adult medical examination without abnormal findings: Secondary | ICD-10-CM

## 2013-11-17 DIAGNOSIS — I8390 Asymptomatic varicose veins of unspecified lower extremity: Secondary | ICD-10-CM | POA: Insufficient documentation

## 2013-11-17 DIAGNOSIS — R609 Edema, unspecified: Secondary | ICD-10-CM | POA: Insufficient documentation

## 2013-11-17 DIAGNOSIS — M7542 Impingement syndrome of left shoulder: Secondary | ICD-10-CM

## 2013-11-17 DIAGNOSIS — R030 Elevated blood-pressure reading, without diagnosis of hypertension: Secondary | ICD-10-CM

## 2013-11-17 DIAGNOSIS — M7580 Other shoulder lesions, unspecified shoulder: Secondary | ICD-10-CM

## 2013-11-17 DIAGNOSIS — M25819 Other specified joint disorders, unspecified shoulder: Secondary | ICD-10-CM

## 2013-11-17 DIAGNOSIS — E785 Hyperlipidemia, unspecified: Secondary | ICD-10-CM

## 2013-11-17 MED ORDER — FUROSEMIDE 20 MG PO TABS: ORAL_TABLET | ORAL | Status: DC

## 2013-11-17 MED ORDER — SERTRALINE HCL 50 MG PO TABS: 50.0000 mg | ORAL_TABLET | Freq: Every day | ORAL | Status: DC

## 2013-11-17 MED ORDER — OXYBUTYNIN CHLORIDE 5 MG PO TABS: ORAL_TABLET | ORAL | Status: DC

## 2013-11-17 NOTE — Progress Notes (Signed)
Pre visit review using our clinic review tool, if applicable. No additional management support is needed unless otherwise documented below in the visit note. 

## 2013-11-17 NOTE — Patient Instructions (Signed)
Your next office appointment will be determined based upon review of your pending labs . Those instructions will be transmitted to you through My Chart  OR  by mail;whichever process is your choice to receive results & recommendations .   As per the Standard of Care , screening Colonoscopy recommended @ 50 & every 5-10 years thereafter . More frequent monitor would be dictated by family history or findings @ Colonoscopy

## 2013-11-17 NOTE — Progress Notes (Signed)
   Subjective:    Patient ID: Jennifer Park, female    DOB: 12/06/1960, 53 y.o.   MRN: [id number]  HPI  She is here for a physical;acute issues include L shoulder pain for 2 months.     Review of Systems  Initially the pain was positional, especially  reaching. There was no specific injury or trigger for the pain. Now although it has improved; she has limited extension of the left upper extremity above her head.     Objective:   Physical Exam    Positive or  pertinent physical findings include: Wax in right otic canal; hearing is normal. She has sutures of the posterior gums of the maxilla bilaterally. Grade 1 systolic murmur is present. There is accentuation of the upper thoracic curvature. She is unable to elevate the left upper extremity more than 15 above the level of the shoulder. She has varicose veins&  scarring at the ankles bilaterally. There is some lymphedema/edema at the ankles.  Gen.: Healthy and well-nourished in appearance. Alert, appropriate and cooperative throughout exam. Appears younger than stated age  Head: Normocephalic without obvious abnormalities  Eyes: No corneal or conjunctival inflammation noted. Pupils equal round reactive to light and accommodation. Extraocular motion intact.  Ears: External  ear exam reveals no significant lesions or deformities.  Nose: External nasal exam reveals no deformity or inflammation. Nasal mucosa are pink and moist. No lesions or exudates noted.   Mouth: Oral mucosa and oropharynx reveal no lesions or exudates. Teeth in good repair. Neck: No deformities, masses, or tenderness noted. Range of motion & Thyroid normal. Lungs: Normal respiratory effort; chest expands symmetrically. Lungs are clear to auscultation without rales, wheezes, or increased work of breathing. Heart: Normal rate and rhythm. Normal S1 and S2. No gallop, click, or rub.  Abdomen: Bowel sounds normal; abdomen soft and nontender. No masses, organomegaly or  hernias noted. Genitalia:  as per Gyn                                  Musculoskeletal/extremities: No clubbing, cyanosis,  or significant extremity  deformity noted. Tone & strength normal. Hand joints normal Fingernail  health good. Able to lie down & sit up w/o help. Negative SLR bilaterally Vascular: Carotid, radial artery, dorsalis pedis and  posterior tibial pulses are full and equal. No bruits present. Neurologic: Alert and oriented x3. Deep tendon reflexes symmetrical and normal.  Gait normal .      Skin: Intact without suspicious lesions or rashes. Lymph: No cervical, axillary lymphadenopathy present. Psych: Mood and affect are normal. Normally interactive                                                                                       Assessment & Plan:  #1 comprehensive physical exam #2 shoulder impingement syndrome  Plan: see Orders  & Recommendations

## 2013-11-23 ENCOUNTER — Encounter: Payer: Self-pay | Admitting: Family Medicine

## 2013-11-23 ENCOUNTER — Other Ambulatory Visit: Payer: Self-pay | Admitting: Internal Medicine

## 2013-11-23 ENCOUNTER — Other Ambulatory Visit (INDEPENDENT_AMBULATORY_CARE_PROVIDER_SITE_OTHER): Payer: Managed Care, Other (non HMO)

## 2013-11-23 ENCOUNTER — Ambulatory Visit (INDEPENDENT_AMBULATORY_CARE_PROVIDER_SITE_OTHER): Payer: Managed Care, Other (non HMO) | Admitting: Family Medicine

## 2013-11-23 VITALS — BP 122/84 | HR 84 | Ht 68.0 in | Wt 258.0 lb

## 2013-11-23 DIAGNOSIS — M7552 Bursitis of left shoulder: Secondary | ICD-10-CM

## 2013-11-23 DIAGNOSIS — Z0000 Encounter for general adult medical examination without abnormal findings: Secondary | ICD-10-CM

## 2013-11-23 DIAGNOSIS — IMO0002 Reserved for concepts with insufficient information to code with codable children: Secondary | ICD-10-CM

## 2013-11-23 DIAGNOSIS — M75100 Unspecified rotator cuff tear or rupture of unspecified shoulder, not specified as traumatic: Secondary | ICD-10-CM

## 2013-11-23 DIAGNOSIS — M25519 Pain in unspecified shoulder: Secondary | ICD-10-CM

## 2013-11-23 DIAGNOSIS — M7500 Adhesive capsulitis of unspecified shoulder: Secondary | ICD-10-CM | POA: Insufficient documentation

## 2013-11-23 DIAGNOSIS — M25512 Pain in left shoulder: Secondary | ICD-10-CM

## 2013-11-23 DIAGNOSIS — M7550 Bursitis of unspecified shoulder: Secondary | ICD-10-CM | POA: Insufficient documentation

## 2013-11-23 DIAGNOSIS — M7502 Adhesive capsulitis of left shoulder: Secondary | ICD-10-CM

## 2013-11-23 LAB — HEPATIC FUNCTION PANEL
ALT: 31 U/L (ref 0–35)
AST: 38 U/L — ABNORMAL HIGH (ref 0–37)
Albumin: 3.8 g/dL (ref 3.5–5.2)
Alkaline Phosphatase: 88 U/L (ref 39–117)
BILIRUBIN DIRECT: 0.1 mg/dL (ref 0.0–0.3)
Total Bilirubin: 0.5 mg/dL (ref 0.2–1.2)
Total Protein: 7.4 g/dL (ref 6.0–8.3)

## 2013-11-23 LAB — CBC WITH DIFFERENTIAL/PLATELET
BASOS ABS: 0 10*3/uL (ref 0.0–0.1)
Basophils Relative: 0.5 % (ref 0.0–3.0)
EOS PCT: 2.2 % (ref 0.0–5.0)
Eosinophils Absolute: 0.1 10*3/uL (ref 0.0–0.7)
HEMATOCRIT: 44.3 % (ref 36.0–46.0)
Hemoglobin: 14.8 g/dL (ref 12.0–15.0)
LYMPHS ABS: 1.7 10*3/uL (ref 0.7–4.0)
Lymphocytes Relative: 30.4 % (ref 12.0–46.0)
MCHC: 33.4 g/dL (ref 30.0–36.0)
MCV: 86.4 fl (ref 78.0–100.0)
MONO ABS: 0.3 10*3/uL (ref 0.1–1.0)
Monocytes Relative: 5.8 % (ref 3.0–12.0)
NEUTROS PCT: 61.1 % (ref 43.0–77.0)
Neutro Abs: 3.4 10*3/uL (ref 1.4–7.7)
PLATELETS: 223 10*3/uL (ref 150.0–400.0)
RBC: 5.13 Mil/uL — ABNORMAL HIGH (ref 3.87–5.11)
RDW: 13.8 % (ref 11.5–15.5)
WBC: 5.5 10*3/uL (ref 4.0–10.5)

## 2013-11-23 LAB — TSH: TSH: 4.54 u[IU]/mL — ABNORMAL HIGH (ref 0.35–4.50)

## 2013-11-23 MED ORDER — DICLOFENAC SODIUM 2 % TD SOLN: TRANSDERMAL | Status: DC

## 2013-11-23 NOTE — Patient Instructions (Signed)
Good to meet you Ice 20 minutes 2 times daily. Usually after activity and before bed.  Heat before stretches if you want.  Exercises 3-4 times a week. [place] twice daily Vitamin D 2000 IU daily.  Anytime sitting try stretching.  Come back in 3 weeks.

## 2013-11-23 NOTE — Assessment & Plan Note (Signed)
Patient was given an injection today. Patient was given home exercise program for range of motion testing for the next 3 weeks. We'll focus on strengthening later. In addition this patient will try some over-the-counter medications and was given a prescription for topical anti-inflammatories. We discussed the importance of stretching at all times. Patient notes if she has any worsening pain that she'll come back sooner. Differential includes rotator cuff tendinopathy but none was seen on ultrasound today. Patient has no early signs of arthritis either. No signs of cervical radiculopathy. Once again patient will followup again in 3 weeks for further evaluation and treatment.

## 2013-11-23 NOTE — Progress Notes (Signed)
[facility] [address] [place], [place] [address] Phone: [phone] Subjective:    I'm seeing this patient by the request  of:  Hopper  CC: Left shoulder pain  [id number] Jennifer Park is a 53 y.o. female coming in with complaint of left shoulder pain. Patient is a right-hand-dominant female who states that this was an insidious onset over the course last 8 weeks. Patient states that the pain was severe at first but the pain started to improve. Patient does notice that she's had a significant loss of motion. Patient states reaching behind her back or over her head is almost impossible though. Patient states that there is a twinge of pain when she tries to externally rotate her arm or reach behind her back. Patient states that the pain is bad enough that he can wake her up at night. Patient is more concerned no with the loss of range of motion. Patient with the severity of 8/10. Does respond minorly to ibuprofen.     Past medical history, social, surgical and family history all reviewed in electronic medical record.   Review of Systems: No headache, visual changes, nausea, vomiting, diarrhea, constipation, dizziness, abdominal pain, skin rash, fevers, chills, night sweats, weight loss, swollen lymph nodes, body aches, joint swelling, muscle aches, chest pain, shortness of breath, mood changes.   Objective Blood pressure 122/84, pulse 84, height 5\' 8"  (1.727 m), weight 258 lb (117.028 kg), SpO2 95.00%.  General: No apparent distress alert and oriented x3 mood and affect normal, dressed appropriately.  HEENT: Pupils equal, extraocular movements intact  Respiratory: Patient's speak in full sentences and does not appear short of breath  Cardiovascular: No lower extremity edema, non tender, no erythema  Skin: Warm dry intact with no signs of infection or rash on extremities or on axial skeleton.  Abdomen: Soft nontender  Neuro: Cranial nerves II through XII  are intact, neurovascularly intact in all extremities with 2+ DTRs and 2+ pulses.  Lymph: No lymphadenopathy of posterior or anterior cervical chain or axillae bilaterally.  Gait normal with good balance and coordination.  MSK:  Non tender with full range of motion and good stability and symmetric strength and tone of  elbows, wrist, hip, knee and ankles bilaterally.  Neck: Inspection unremarkable. No palpable stepoffs. Negative Spurling's maneuver. Full neck range of motion Grip strength and sensation normal in bilateral hands Strength good C4 to T1 distribution No sensory change to C4 to T1 Negative Hoffman sign bilaterally Reflexes normal Shoulder: left Inspection reveals no abnormalities, atrophy or asymmetry. Palpation is normal with no tenderness over AC joint or bicipital groove. ROM is restricted passively with forward flexion to 120, external rotation of 5, internal rotation to sacrum. Rotator cuff strength normal throughout. signs of impingement with positive Neer and Hawkin's tests, but negative empty can sign. Speeds and Yergason's tests normal. No labral pathology noted with negative Obrien's, negative clunk and good stability. Normal scapular function observed. No painful arc and no drop arm sign. No apprehension sign Contralateral shoulder unremarkable  MSK US performed of: left This study was ordered, performed, and interpreted by Charlann Boxer D.O.  Shoulder:   Supraspinatus:  Appears normal on long and transverse views, Bursal bulge seen with shoulder abduction on impingement view. Severe thickening of the capsule noted Infraspinatus:  Appears normal on long and transverse views. Significant increase in Doppler flow Subscapularis:  Appears normal on long and transverse views. Positive bursa severe thickening the capsule noted. Teres Minor:  Appears normal on long and transverse views. AC joint:  Capsule undistended, no geyser sign. Glenohumeral Joint:  Appears  normal without effusion. Glenoid Labrum:  Intact without visualized tears. Biceps Tendon:  Appears normal on long and transverse views, no fraying of tendon, tendon located in intertubercular groove, no subluxation with shoulder internal or external rotation.  Impression: Adhesive capsulitis with mild subacromial bursitis  Procedure: Real-time Ultrasound Guided Injection of left glenohumeral joint Device: GE Logiq E  Ultrasound guided injection is preferred based studies that show increased duration, increased effect, greater accuracy, decreased procedural pain, increased response rate with ultrasound guided versus blind injection.  Verbal informed consent obtained.  Time-out conducted.  Noted no overlying erythema, induration, or other signs of local infection.  Skin prepped in a sterile fashion.  Local anesthesia: Topical Ethyl chloride.  With sterile technique and under real time ultrasound guidance:  Joint visualized.  23g 1  inch needle inserted posterior approach. Pictures taken for needle placement. Patient did have injection of 2 cc of 1% lidocaine, 2 cc of 0.5% Marcaine, and 1.0 cc of Kenalog 40 mg/dL. Completed without difficulty  Pain immediately resolved suggesting accurate placement of the medication.  Advised to call if fevers/chills, erythema, induration, drainage, or persistent bleeding.  Images permanently stored and available for review in the ultrasound unit.  Impression: Technically successful ultrasound guided injection.     Impression and Recommendations:     This case required medical decision making of moderate complexity.

## 2013-11-24 LAB — NMR LIPOPROFILE WITH LIPIDS
Cholesterol, Total: 201 mg/dL — ABNORMAL HIGH (ref <200)
HDL Particle Number: 25.5 umol/L — ABNORMAL LOW (ref >=30.5)
HDL SIZE: 8.4 nm — AB (ref >=9.2)
HDL-C: 41 mg/dL (ref >=40)
LDL (calc): 138 mg/dL — ABNORMAL HIGH (ref <100)
LDL PARTICLE NUMBER: 1815 nmol/L — AB (ref <1000)
LDL Size: 20.7 nm (ref >20.5)
LP-IR SCORE: 60 — AB (ref <=45)
Large HDL-P: 2.8 umol/L — ABNORMAL LOW (ref >=4.8)
Large VLDL-P: 2 nmol/L (ref <=2.7)
Small LDL Particle Number: 846 nmol/L — ABNORMAL HIGH (ref <=527)
TRIGLYCERIDES: 109 mg/dL (ref <150)
VLDL SIZE: 43.8 nm (ref <=46.6)

## 2013-11-26 ENCOUNTER — Other Ambulatory Visit: Payer: Self-pay | Admitting: Internal Medicine

## 2013-11-26 ENCOUNTER — Encounter: Payer: Self-pay | Admitting: Internal Medicine

## 2013-11-26 DIAGNOSIS — R946 Abnormal results of thyroid function studies: Secondary | ICD-10-CM

## 2013-12-20 ENCOUNTER — Other Ambulatory Visit (INDEPENDENT_AMBULATORY_CARE_PROVIDER_SITE_OTHER): Payer: Managed Care, Other (non HMO)

## 2013-12-20 ENCOUNTER — Ambulatory Visit (INDEPENDENT_AMBULATORY_CARE_PROVIDER_SITE_OTHER): Payer: Managed Care, Other (non HMO) | Admitting: Family Medicine

## 2013-12-20 ENCOUNTER — Encounter: Payer: Self-pay | Admitting: Family Medicine

## 2013-12-20 VITALS — BP 118/82 | HR 64 | Ht 68.0 in | Wt 256.0 lb

## 2013-12-20 DIAGNOSIS — M25519 Pain in unspecified shoulder: Secondary | ICD-10-CM

## 2013-12-20 DIAGNOSIS — M25512 Pain in left shoulder: Secondary | ICD-10-CM

## 2013-12-20 DIAGNOSIS — M7500 Adhesive capsulitis of unspecified shoulder: Secondary | ICD-10-CM

## 2013-12-20 DIAGNOSIS — M7502 Adhesive capsulitis of left shoulder: Secondary | ICD-10-CM

## 2013-12-20 NOTE — Progress Notes (Signed)
[facility] [place], [place] [address] Phone: [phone] Subjective:    CC: Left shoulder pain follow up  OEV:[id number] Jennifer Park is a 53 y.o. female coming in with complaint of left shoulder pain. Patient was seen previously and was diagnosed with an adhesive capsulitis as well as some subacromial bursitis. Patient was given an intra-articular injection under ultrasound guidance as well as a home exercise program, icing protocol and we discussed different over-the-counter medications. Patient states the pain is significantly better and has regained some range of motion but not entirely. Patient states that she is about 80% better. Denies any pain at night, denies any radiation of pain down the arm or any numbness or tingling. Overall she feels like she is making strides.    Past medical history, social, surgical and family history all reviewed in electronic medical record.   Review of Systems: No headache, visual changes, nausea, vomiting, diarrhea, constipation, dizziness, abdominal pain, skin rash, fevers, chills, night sweats, weight loss, swollen lymph nodes, body aches, joint swelling, muscle aches, chest pain, shortness of breath, mood changes.   Objective Blood pressure 118/82, pulse 64, height 5\' 8"  (1.727 m), weight 256 lb (116.121 kg).  General: No apparent distress alert and oriented x3 mood and affect normal, dressed appropriately.  HEENT: Pupils equal, extraocular movements intact  Respiratory: Patient's speak in full sentences and does not appear short of breath  Cardiovascular: No lower extremity edema, non tender, no erythema  Skin: Warm dry intact with no signs of infection or rash on extremities or on axial skeleton.  Abdomen: Soft nontender  Neuro: Cranial nerves II through XII are intact, neurovascularly intact in all extremities with 2+ DTRs and 2+ pulses.  Lymph: No lymphadenopathy of posterior or anterior cervical chain  or axillae bilaterally.  Gait normal with good balance and coordination.  MSK:  Non tender with full range of motion and good stability and symmetric strength and tone of  elbows, wrist, hip, knee and ankles bilaterally.  Neck: Inspection unremarkable. No palpable stepoffs. Negative Spurling's maneuver. Full neck range of motion Grip strength and sensation normal in bilateral hands Strength good C4 to T1 distribution No sensory change to C4 to T1 Negative Hoffman sign bilaterally Reflexes normal Shoulder: left Inspection reveals no abnormalities, atrophy or asymmetry. Palpation is normal with no tenderness over AC joint or bicipital groove. ROM is restricted passively with forward flexion to 150, external rotation of 15, internal rotation to L5 which are all improvement from previous exam. Rotator cuff strength normal throughout. signs of impingement with positive Neer and Hawkin's tests, but negative empty can sign. Speeds and Yergason's tests normal. No labral pathology noted with negative Obrien's, negative clunk and good stability. Normal scapular function observed. No painful arc and no drop arm sign. No apprehension sign Contralateral shoulder unremarkable  MSK US performed of: left This study was ordered, performed, and interpreted by Charlann Boxer D.O.  Shoulder:   Supraspinatus:  Appears normal on long and transverse views,  Severe thickening of the capsule noted Infraspinatus:  Appears normal on long and transverse views.  Subscapularis:  Appears normal on long and transverse views. Thickening of capsule still noted. Teres Minor:  Appears normal on long and transverse views. AC joint:  Capsule undistended, no geyser sign. Glenohumeral Joint:  Appears normal without effusion. Glenoid Labrum:  Intact without visualized tears. Biceps Tendon:  Appears normal on long and transverse views, no fraying of tendon, tendon located in intertubercular  groove, no subluxation with  shoulder internal or external rotation.  Impression: Adhesive capsulitis with resolved bursitis  Procedure: Real-time Ultrasound Guided Injection of left glenohumeral joint Device: GE Logiq E  Ultrasound guided injection is preferred based studies that show increased duration, increased effect, greater accuracy, decreased procedural pain, increased response rate with ultrasound guided versus blind injection.  Verbal informed consent obtained.  Time-out conducted.  Noted no overlying erythema, induration, or other signs of local infection.  Skin prepped in a sterile fashion.  Local anesthesia: Topical Ethyl chloride.  With sterile technique and under real time ultrasound guidance:  Joint visualized.  23g 1  inch needle inserted posterior approach. Pictures taken for needle placement. Patient did have injection of 2 cc of 1% lidocaine, 2 cc of 0.5% Marcaine, and 1.0 cc of Kenalog 40 mg/dL. Completed without difficulty  Pain immediately resolved suggesting accurate placement of the medication.  Advised to call if fevers/chills, erythema, induration, drainage, or persistent bleeding.  Images permanently stored and available for review in the ultrasound unit.  Impression: Technically successful ultrasound guided injection.     Impression and Recommendations:     This case required medical decision making of moderate complexity.

## 2013-12-20 NOTE — Assessment & Plan Note (Signed)
Patient was given a repeat injection today where medical evidence shows that quick succession of corticosteroid injections for adhesive capsulitis can show significant improvement. Patient will continue with home exercises and has declined formal physical therapy. Discuss continuing the icing protocol and was given phase II exercises for strengthening ensure proper technique today. Patient is going to try these interventions and come back again in 4-6 weeks. At that time we'll reevaluate patient's situation.  Spent greater than 25 minutes with patient face-to-face and had greater than 50% of counseling including as described above in assessment and plan.

## 2013-12-20 NOTE — Patient Instructions (Signed)
It is good to see you You are doing better.  Ice is your friend.  Continue the stretches.  Call in 2 weeks if no improvement and we will start physical therapy.  Come back again in 4-6 weeks.

## 2014-02-12 ENCOUNTER — Ambulatory Visit (INDEPENDENT_AMBULATORY_CARE_PROVIDER_SITE_OTHER): Payer: Managed Care, Other (non HMO) | Admitting: Physician Assistant

## 2014-02-12 VITALS — BP 108/77 | HR 88 | Temp 98.1°F | Resp 20 | Ht 68.0 in | Wt 261.0 lb

## 2014-02-12 DIAGNOSIS — J069 Acute upper respiratory infection, unspecified: Secondary | ICD-10-CM

## 2014-02-12 MED ORDER — GUAIFENESIN ER 1200 MG PO TB12: 1.0000 | ORAL_TABLET | Freq: Two times a day (BID) | ORAL | Status: DC | PRN

## 2014-02-12 MED ORDER — BENZONATATE 100 MG PO CAPS: 100.0000 mg | ORAL_CAPSULE | Freq: Three times a day (TID) | ORAL | Status: DC | PRN

## 2014-02-12 MED ORDER — IPRATROPIUM BROMIDE 0.03 % NA SOLN: 2.0000 | Freq: Two times a day (BID) | NASAL | Status: DC

## 2014-02-12 NOTE — Patient Instructions (Signed)
Upper Respiratory Infection, Adult An upper respiratory infection (URI) is also sometimes known as the common cold. The upper respiratory tract includes the nose, sinuses, throat, trachea, and bronchi. Bronchi are the airways leading to the lungs. Most people improve within 1 week, but symptoms can last up to 2 weeks. A residual cough may last even longer.  CAUSES Many different viruses can infect the tissues lining the upper respiratory tract. The tissues become irritated and inflamed and often become very moist. Mucus production is also common. A cold is contagious. You can easily spread the virus to others by oral contact. This includes kissing, sharing a glass, coughing, or sneezing. Touching your mouth or nose and then touching a surface, which is then touched by another person, can also spread the virus. SYMPTOMS  Symptoms typically develop 1 to 3 days after you come in contact with a cold virus. Symptoms vary from person to person. They may include:  Runny nose.  Sneezing.  Nasal congestion.  Sinus irritation.  Sore throat.  Loss of voice (laryngitis).  Cough.  Fatigue.  Muscle aches.  Loss of appetite.  Headache.  Low-grade fever. DIAGNOSIS  You might diagnose your own cold based on familiar symptoms, since most people get a cold 2 to 3 times a year. Your caregiver can confirm this based on your exam. Most importantly, your caregiver can check that your symptoms are not due to another disease such as strep throat, sinusitis, pneumonia, asthma, or epiglottitis. Blood tests, throat tests, and X-rays are not necessary to diagnose a common cold, but they may sometimes be helpful in excluding other more serious diseases. Your caregiver will decide if any further tests are required. RISKS AND COMPLICATIONS  You may be at risk for a more severe case of the common cold if you smoke cigarettes, have chronic heart disease (such as heart failure) or lung disease (such as asthma), or if  you have a weakened immune system. The very young and very old are also at risk for more serious infections. Bacterial sinusitis, middle ear infections, and bacterial pneumonia can complicate the common cold. The common cold can worsen asthma and chronic obstructive pulmonary disease (COPD). Sometimes, these complications can require emergency medical care and may be life-threatening. PREVENTION  The best way to protect against getting a cold is to practice good hygiene. Avoid oral or hand contact with people with cold symptoms. Wash your hands often if contact occurs. There is no clear evidence that vitamin C, vitamin E, echinacea, or exercise reduces the chance of developing a cold. However, it is always recommended to get plenty of rest and practice good nutrition. TREATMENT  Treatment is directed at relieving symptoms. There is no cure. Antibiotics are not effective, because the infection is caused by a virus, not by bacteria. Treatment may include:  Increased fluid intake. Sports drinks offer valuable electrolytes, sugars, and fluids.  Breathing heated mist or steam (vaporizer or shower).  Eating chicken soup or other clear broths, and maintaining good nutrition.  Getting plenty of rest.  Using gargles or lozenges for comfort.  Controlling fevers with ibuprofen or acetaminophen as directed by your caregiver.  Increasing usage of your inhaler if you have asthma. Zinc gel and zinc lozenges, taken in the first 24 hours of the common cold, can shorten the duration and lessen the severity of symptoms. Pain medicines may help with fever, muscle aches, and throat pain. A variety of non-prescription medicines are available to treat congestion and runny nose. Your caregiver   can make recommendations and may suggest nasal or lung inhalers for other symptoms.  HOME CARE INSTRUCTIONS   Only take over-the-counter or prescription medicines for pain, discomfort, or fever as directed by your  caregiver.  Use a warm mist humidifier or inhale steam from a shower to increase air moisture. This may keep secretions moist and make it easier to breathe.  Drink enough water and fluids to keep your urine clear or pale yellow.  Rest as needed.  Return to work when your temperature has returned to normal or as your caregiver advises. You may need to stay home longer to avoid infecting others. You can also use a face mask and careful hand washing to prevent spread of the virus. SEEK MEDICAL CARE IF:   After the first few days, you feel you are getting worse rather than better.  You need your caregiver's advice about medicines to control symptoms.  You develop chills, worsening shortness of breath, or brown or red sputum. These may be signs of pneumonia.  You develop yellow or brown nasal discharge or pain in the face, especially when you bend forward. These may be signs of sinusitis.  You develop a fever, swollen neck glands, pain with swallowing, or white areas in the back of your throat. These may be signs of strep throat. SEEK IMMEDIATE MEDICAL CARE IF:   You have a fever.  You develop severe or persistent headache, ear pain, sinus pain, or chest pain.  You develop wheezing, a prolonged cough, cough up blood, or have a change in your usual mucus (if you have chronic lung disease).  You develop sore muscles or a stiff neck. Document Released: 09/10/2000 Document Revised: 06/09/2011 Document Reviewed: 06/22/2013 [employer] Patient Information 2015 [employer]. This information is not intended to replace advice given to you by your health care provider. Make sure you discuss any questions you have with your health care provider.  

## 2014-02-12 NOTE — Progress Notes (Signed)
Subjective:    Patient ID: [facility], female    DOB: 1960-05-30, 53 y.o.   MRN: [id number]  HPI Patient presents for productive cough and sore throat that have been present for 3 days. Prior to this has had post nasal drip for 2 weeks from flare of seasonal allergies. Currently having myalgia, feeling tired,and sinus pressure. Has R ear "stopped up" feeling and decreased hearing. Felt hot, but has not checked temperature. No h/o asthma. Has not tried any meds for relief. No sick contacts.   Review of Systems  Constitutional: Positive for fatigue. Negative for fever, chills and appetite change.  HENT: Positive for hearing loss (with R ear stopped up feeling), postnasal drip, sinus pressure and sore throat. Negative for congestion, ear discharge, ear pain, rhinorrhea and sneezing.   Eyes: Negative for pain, discharge and itching.  Respiratory: Positive for cough (dry). Negative for chest tightness, shortness of breath and wheezing.   Cardiovascular: Negative for chest pain, palpitations and leg swelling.  Gastrointestinal: Negative for nausea, vomiting and abdominal pain.  Musculoskeletal: Positive for myalgias. Negative for neck pain and neck stiffness.  Allergic/Immunologic: Positive for environmental allergies (fall season). Negative for food allergies.  Neurological: Negative for dizziness, light-headedness and headaches.  Hematological: Negative for adenopathy.       Objective:   Physical Exam  Constitutional: She is oriented to person, place, and time. She appears well-developed and well-nourished. No distress.  Blood pressure 108/77, pulse 88, temperature 98.1 F (36.7 C), temperature source Oral, resp. rate 20, height 5\' 8"  (1.727 m), weight 261 lb (118.389 kg), SpO2 97 %.   HENT:  Head: Normocephalic and atraumatic.  Right Ear: External ear normal. No drainage, swelling or tenderness. A foreign body (cerumen impaction) is present.  Left Ear: Tympanic membrane and external  ear normal. No drainage, swelling or tenderness. No foreign bodies.  No middle ear effusion.  Nose: Mucosal edema (with erythema) and rhinorrhea present. No sinus tenderness or septal deviation. Right sinus exhibits no maxillary sinus tenderness and no frontal sinus tenderness. Left sinus exhibits no maxillary sinus tenderness and no frontal sinus tenderness.  Mouth/Throat: Oropharynx is clear and moist and mucous membranes are normal. No oropharyngeal exudate, posterior oropharyngeal edema or posterior oropharyngeal erythema.  Eyes: Conjunctivae are normal. Pupils are equal, round, and reactive to light. Right eye exhibits no discharge. Left eye exhibits no discharge. No scleral icterus.  Neck: Normal range of motion. Neck supple.  Cardiovascular: Normal rate, regular rhythm and normal heart sounds.  Exam reveals no gallop and no friction rub.   No murmur heard. Pulmonary/Chest: Effort normal and breath sounds normal. She has no decreased breath sounds. She has no wheezes. She has no rhonchi. She has no rales.  Abdominal: Soft. There is no tenderness.  Lymphadenopathy:    She has no cervical adenopathy.  Neurological: She is alert and oriented to person, place, and time.  Skin: Skin is warm and dry. No rash noted. She is not diaphoretic. No erythema. No pallor.        Assessment & Plan:  1. Acute upper respiratory infection - benzonatate (TESSALON) 100 MG capsule; Take 1-2 capsules (100-200 mg total) by mouth 3 (three) times daily as needed for cough.  Dispense: 40 capsule; Refill: 0 - ipratropium (ATROVENT) 0.03 % nasal spray; Place 2 sprays into both nostrils 2 (two) times daily.  Dispense: 30 mL; Refill: 0 - Guaifenesin (MUCINEX MAXIMUM STRENGTH) 1200 MG TB12; Take 1 tablet (1,200 mg total) by mouth every  12 (twelve) hours as needed.  Dispense: 14 tablet; Refill: 1 - Continue certirizine. - Plenty of fluid and rest.   Alveta Heimlich PA-C  [facility] 02/12/2014 2:51 PM

## 2014-02-13 NOTE — Progress Notes (Signed)
I have discussed this case with Ms. Ricki Miller, PA-C and agree.

## 2014-07-10 ENCOUNTER — Other Ambulatory Visit: Payer: Self-pay

## 2014-07-10 MED ORDER — FUROSEMIDE 20 MG PO TABS: ORAL_TABLET | ORAL | Status: DC

## 2014-07-24 ENCOUNTER — Ambulatory Visit (INDEPENDENT_AMBULATORY_CARE_PROVIDER_SITE_OTHER): Payer: Managed Care, Other (non HMO)

## 2014-07-24 ENCOUNTER — Ambulatory Visit (INDEPENDENT_AMBULATORY_CARE_PROVIDER_SITE_OTHER): Payer: Managed Care, Other (non HMO) | Admitting: Emergency Medicine

## 2014-07-24 VITALS — BP 118/78 | HR 74 | Temp 98.1°F | Resp 16 | Ht 68.5 in | Wt 225.0 lb

## 2014-07-24 DIAGNOSIS — M25571 Pain in right ankle and joints of right foot: Secondary | ICD-10-CM

## 2014-07-24 DIAGNOSIS — S8261XA Displaced fracture of lateral malleolus of right fibula, initial encounter for closed fracture: Secondary | ICD-10-CM | POA: Diagnosis not present

## 2014-07-24 NOTE — Patient Instructions (Signed)
Ankle Fracture  A fracture is a break in a bone. The ankle joint is made up of three bones. These include the lower (distal)sections of your lower leg bones, called the tibia and fibula, along with a bone in your foot, called the talus. Depending on how bad the break is and if more than one ankle joint bone is broken, a cast or splint is used to protect and keep your injured bone from moving while it heals. Sometimes, surgery is required to help the fracture heal properly.   There are two general types of fractures:   Stable fracture. This includes a single fracture line through one bone, with no injury to ankle ligaments. A fracture of the talus that does not have any displacement (movement of the bone on either side of the fracture line) is also stable.   Unstable fracture. This includes more than one fracture line through one or more bones in the ankle joint. It also includes fractures that have displacement of the bone on either side of the fracture line.  CAUSES   A direct blow to the ankle.    Quickly and severely twisting your ankle.   Trauma, such as a car accident or falling from a significant height.  RISK FACTORS  You may be at a higher risk of ankle fracture if:   You have certain medical conditions.   You are involved in high-impact sports.   You are involved in a high-impact car accident.  SIGNS AND SYMPTOMS    Tender and swollen ankle.   Bruising around the injured ankle.   Pain on movement of the ankle.   Difficulty walking or putting weight on the ankle.   A cold foot below the site of the ankle injury. This can occur if the blood vessels passing through your injured ankle were also damaged.   Numbness in the foot below the site of the ankle injury.  DIAGNOSIS   An ankle fracture is usually diagnosed with a physical exam and X-rays. A CT scan may also be required for complex fractures.  TREATMENT   Stable fractures are treated with a cast or splint and using crutches to avoid putting  weight on your injured ankle. This is followed by an ankle strengthening program. Some patients require a special type of cast, depending on other medical problems they may have. Unstable fractures require surgery to ensure the bones heal properly. Your health care provider will tell you what type of fracture you have and the best treatment for your condition.  HOME CARE INSTRUCTIONS    Review correct crutch use with your health care provider and use your crutches as directed. Safe use of crutches is extremely important. Misuse of crutches can cause you to fall or cause injury to nerves in your hands or armpits.   Do not put weight or pressure on the injured ankle until directed by your health care provider.   To lessen the swelling, keep the injured leg elevated while sitting or lying down.   Apply ice to the injured area:   Put ice in a plastic bag.   Place a towel between your cast and the bag.   Leave the ice on for 20 minutes, 2-3 times a day.   If you have a plaster or fiberglass cast:   Do not try to scratch the skin under the cast with any objects. This can increase your risk of skin infection.   Check the skin around the cast every day. You   may put lotion on any red or sore areas.   Keep your cast dry and clean.   If you have a plaster splint:   Wear the splint as directed.   You may loosen the elastic around the splint if your toes become numb, tingle, or turn cold or blue.   Do not put pressure on any part of your cast or splint; it may break. Rest your cast only on a pillow the first 24 hours until it is fully hardened.   Your cast or splint can be protected during bathing with a plastic bag sealed to your skin with medical tape. Do not lower the cast or splint into water.   Take medicines as directed by your health care provider. Only take over-the-counter or prescription medicines for pain, discomfort, or fever as directed by your health care provider.   Do not drive a vehicle until  your health care provider specifically tells you it is safe to do so.   If your health care provider has given you a follow-up appointment, it is very important to keep that appointment. Not keeping the appointment could result in a chronic or permanent injury, pain, and disability. If you have any problem keeping the appointment, call the facility for assistance.  SEEK MEDICAL CARE IF:  You develop increased swelling or discomfort.  SEEK IMMEDIATE MEDICAL CARE IF:    Your cast gets damaged or breaks.   You have continued severe pain.   You develop new pain or swelling after the cast was put on.   Your skin or toenails below the injury turn blue or gray.   Your skin or toenails below the injury feel cold, numb, or have loss of sensitivity to touch.   There is a bad smell or pus draining from under the cast.  MAKE SURE YOU:    Understand these instructions.   Will watch your condition.   Will get help right away if you are not doing well or get worse.  Document Released: 03/14/2000 Document Revised: 03/22/2013 Document Reviewed: 10/14/2012  [employer] Patient Information 2015 [employer]. This information is not intended to replace advice given to you by your health care provider. Make sure you discuss any questions you have with your health care provider.

## 2014-07-24 NOTE — Progress Notes (Signed)
[facility] [address] [place] [address] [phone]  Date:  07/24/2014   Name:  Jennifer Park   DOB:  05-15-1960   MRN:  [id number]  PCP:  Unice Cobble, MD    Chief Complaint: Ankle Pain   History of Present Illness:  Jennifer Park is a 54 y.o. very pleasant female patient who presents with the following:  Injured yesterday while entering her car.  She was struck in the lateral ankle by a door  Pain in lateral ankle Unable to bear weight without pain. Pain less with elevation Moderate swelling and tenderness No improvement with over the counter medications or other home remedies.  Denies other complaint or health concern today.   Patient Active Problem List   Diagnosis Date Noted  . Adhesive capsulitis 11/23/2013  . Subacromial bursitis 11/23/2013  . Varicose veins 11/17/2013  . Edema 11/17/2013  . HYPERLIPIDEMIA 04/16/2010  . Elevated blood pressure reading without diagnosis of hypertension 04/16/2010  . ANXIETY STATE NOS 12/15/2006  . DYSFUNCTION, BLADDER NEC 12/15/2006    Past Medical History  Diagnosis Date  . Anxiety     situational  . Hyperlipidemia   . Hypertension   . Allergy     Past Surgical History  Procedure Laterality Date  . Ganglion cyst excision    . Abdominal hysterectomy      for abnormal cytology; Dr Irven [place]  . Tonsillectomy    . No colonoscopy      11/17/13 SOC reviewed  . Varicose vein surgery      as OP X2  . Cesarean section      History  Substance Use Topics  . Smoking status: Former Smoker    Quit date: 03/31/1984  . Smokeless tobacco: Never Used     Comment: age [age] , up to 1 ppd  . Alcohol Use: 2.4 oz/week    4 Glasses of wine per week    Family History  Problem Relation Age of Onset  . Bipolar disorder Mother   . Breast cancer Mother   . Cancer Mother   . Diabetes Father   . Kidney failure Father   . Heart failure Father   . COPD Father   . Heart disease Father   . Hyperlipidemia  Father   . Bipolar disorder Sister   . Heart attack Maternal Grandfather     in late 78s  . Stroke Maternal Grandmother     in 90s    Allergies  Allergen Reactions  . Benazepril Hcl     REACTION: dry cough  . Morphine     REACTION: vomiting there is aquestion of this    Medication list has been reviewed and updated.  Current Outpatient Prescriptions on File Prior to Visit  Medication Sig Dispense Refill  . sertraline (ZOLOFT) 50 MG tablet Take 1 tablet (50 mg total) by mouth daily. 90 tablet 3  . benzonatate (TESSALON) 100 MG capsule Take 1-2 capsules (100-200 mg total) by mouth 3 (three) times daily as needed for cough. (Patient not taking: Reported on 07/24/2014) 40 capsule 0  . furosemide (LASIX) 20 MG tablet 1 qd prn (Patient not taking: Reported on 07/24/2014) 30 tablet 5  . Guaifenesin (MUCINEX MAXIMUM STRENGTH) 1200 MG TB12 Take 1 tablet (1,200 mg total) by mouth every 12 (twelve) hours as needed. (Patient not taking: Reported on 07/24/2014) 14 tablet 1  . ipratropium (ATROVENT) 0.03 % nasal spray Place 2 sprays into both nostrils 2 (two) times  daily. (Patient not taking: Reported on 07/24/2014) 30 mL 0  . oxybutynin (DITROPAN) 5 MG tablet TAKE 1 TABLET EVERY DAY (Patient not taking: Reported on 07/24/2014) 90 tablet 3  . [DISCONTINUED] TOVIAZ 4 MG TB24 TAKE 1 TABLET BY MOUTH EVERY DAY 30 tablet 5   No current facility-administered medications on file prior to visit.    Review of Systems:  As per HPI, otherwise negative.    Physical Examination: Filed Vitals:   07/24/14 1000  BP: 118/78  Pulse: 74  Temp: 98.1 F (36.7 C)  Resp: 16   Filed Vitals:   07/24/14 1000  Height: 5' 8.5" (1.74 m)  Weight: 225 lb (102.059 kg)   Body mass index is 33.71 kg/(m^2). Ideal Body Weight: Weight in (lb) to have BMI = 25: 166.5   GEN: WDWN, NAD, Non-toxic, Alert & Oriented x 3 HEENT: Atraumatic, Normocephalic.  Ears and Nose: No external deformity. EXTR: No  clubbing/cyanosis/edema NEURO: Normal gait.  PSYCH: Normally interactive. Conversant. Not depressed or anxious appearing.  Calm demeanor.  Ankle:  RIGHT ankle tender and swollen.  No deformity.  Guards ankle  Assessment and Plan: Fracture distal fibula Boot Ortho (self)  Signed,  Ellison Carwin, MD   [facility] reading (PRIMARY) by  Dr. Ouida Sills.  Disruption cortex lateral malleolus.

## 2014-11-29 ENCOUNTER — Telehealth: Payer: Self-pay | Admitting: Emergency Medicine

## 2014-11-29 ENCOUNTER — Other Ambulatory Visit: Payer: Self-pay | Admitting: Emergency Medicine

## 2014-11-29 MED ORDER — SERTRALINE HCL 50 MG PO TABS: 50.0000 mg | ORAL_TABLET | Freq: Every day | ORAL | Status: DC

## 2014-11-29 MED ORDER — OXYBUTYNIN CHLORIDE 5 MG PO TABS: ORAL_TABLET | ORAL | Status: DC

## 2014-11-29 NOTE — Telephone Encounter (Signed)
#  30;make appt please

## 2014-11-29 NOTE — Telephone Encounter (Signed)
Refill Request for Zoloft, last OV 8/15. Please advise

## 2015-01-11 ENCOUNTER — Other Ambulatory Visit: Payer: Self-pay | Admitting: Radiology

## 2015-01-12 ENCOUNTER — Encounter: Payer: Self-pay | Admitting: *Deleted

## 2015-01-12 ENCOUNTER — Telehealth: Payer: Self-pay | Admitting: *Deleted

## 2015-01-12 DIAGNOSIS — C50512 Malignant neoplasm of lower-outer quadrant of left female breast: Secondary | ICD-10-CM | POA: Insufficient documentation

## 2015-01-12 HISTORY — DX: Malignant neoplasm of lower-outer quadrant of left female breast: C50.512

## 2015-01-12 NOTE — Telephone Encounter (Signed)
Confirmed [facility] for 01/17/15 at 0830 .  Instructions and contact information given.

## 2015-01-15 ENCOUNTER — Other Ambulatory Visit: Payer: Self-pay | Admitting: Emergency Medicine

## 2015-01-15 MED ORDER — FUROSEMIDE 20 MG PO TABS: ORAL_TABLET | ORAL | Status: DC

## 2015-01-17 ENCOUNTER — Ambulatory Visit (HOSPITAL_BASED_OUTPATIENT_CLINIC_OR_DEPARTMENT_OTHER): Payer: Managed Care, Other (non HMO) | Admitting: Oncology

## 2015-01-17 ENCOUNTER — Encounter: Payer: Self-pay | Admitting: Physical Therapy

## 2015-01-17 ENCOUNTER — Encounter: Payer: Self-pay | Admitting: Skilled Nursing Facility1

## 2015-01-17 ENCOUNTER — Other Ambulatory Visit (HOSPITAL_BASED_OUTPATIENT_CLINIC_OR_DEPARTMENT_OTHER): Payer: Managed Care, Other (non HMO)

## 2015-01-17 ENCOUNTER — Ambulatory Visit: Payer: Managed Care, Other (non HMO) | Attending: General Surgery | Admitting: Physical Therapy

## 2015-01-17 ENCOUNTER — Encounter: Payer: Self-pay | Admitting: Oncology

## 2015-01-17 ENCOUNTER — Ambulatory Visit
Admission: RE | Admit: 2015-01-17 | Discharge: 2015-01-17 | Disposition: A | Discharge status: Home / Self Care | Payer: Managed Care, Other (non HMO) | Source: Ambulatory Visit | Attending: Radiation Oncology | Admitting: Radiation Oncology

## 2015-01-17 ENCOUNTER — Encounter: Payer: Self-pay | Admitting: Nurse Practitioner

## 2015-01-17 VITALS — BP 132/82 | HR 74 | Temp 97.5°F | Resp 18 | Ht 68.5 in | Wt 270.4 lb

## 2015-01-17 DIAGNOSIS — C50512 Malignant neoplasm of lower-outer quadrant of left female breast: Secondary | ICD-10-CM

## 2015-01-17 DIAGNOSIS — R293 Abnormal posture: Secondary | ICD-10-CM | POA: Insufficient documentation

## 2015-01-17 DIAGNOSIS — R921 Mammographic calcification found on diagnostic imaging of breast: Secondary | ICD-10-CM | POA: Diagnosis not present

## 2015-01-17 DIAGNOSIS — Z170 Estrogen receptor positive status [ER+]: Secondary | ICD-10-CM

## 2015-01-17 DIAGNOSIS — D0512 Intraductal carcinoma in situ of left breast: Secondary | ICD-10-CM

## 2015-01-17 LAB — COMPREHENSIVE METABOLIC PANEL (CC13)
ALBUMIN: 3.5 g/dL (ref 3.5–5.0)
ALK PHOS: 111 U/L (ref 40–150)
ALT: 33 U/L (ref 0–55)
AST: 28 U/L (ref 5–34)
Anion Gap: 8 mEq/L (ref 3–11)
BUN: 12.6 mg/dL (ref 7.0–26.0)
CO2: 25 mEq/L (ref 22–29)
CREATININE: 0.8 mg/dL (ref 0.6–1.1)
Calcium: 9 mg/dL (ref 8.4–10.4)
Chloride: 111 mEq/L — ABNORMAL HIGH (ref 98–109)
EGFR: 84 mL/min/{1.73_m2} — ABNORMAL LOW (ref >90)
Glucose: 78 mg/dl (ref 70–140)
POTASSIUM: 3.7 meq/L (ref 3.5–5.1)
Sodium: 143 mEq/L (ref 136–145)
Total Bilirubin: 0.47 mg/dL (ref 0.20–1.20)
Total Protein: 6.7 g/dL (ref 6.4–8.3)

## 2015-01-17 LAB — CBC WITH DIFFERENTIAL/PLATELET
BASO%: 0.4 % (ref 0.0–2.0)
Basophils Absolute: 0 10*3/uL (ref 0.0–0.1)
EOS%: 2.8 % (ref 0.0–7.0)
Eosinophils Absolute: 0.2 10*3/uL (ref 0.0–0.5)
HEMATOCRIT: 43.1 % (ref 34.8–46.6)
HEMOGLOBIN: 13.9 g/dL (ref 11.6–15.9)
LYMPH%: 23.3 % (ref 14.0–49.7)
MCH: 28.1 pg (ref 25.1–34.0)
MCHC: 32.3 g/dL (ref 31.5–36.0)
MCV: 87.1 fL (ref 79.5–101.0)
MONO#: 0.3 10*3/uL (ref 0.1–0.9)
MONO%: 5.2 % (ref 0.0–14.0)
NEUT#: 3.7 10*3/uL (ref 1.5–6.5)
NEUT%: 68.3 % (ref 38.4–76.8)
Platelets: 185 10*3/uL (ref 145–400)
RBC: 4.95 10*6/uL (ref 3.70–5.45)
RDW: 13.9 % (ref 11.2–14.5)
WBC: 5.4 10*3/uL (ref 3.9–10.3)
lymph#: 1.3 10*3/uL (ref 0.9–3.3)

## 2015-01-17 NOTE — Progress Notes (Signed)
[facility]  [phone] [phone]     ID: Jennifer Park DOB: [birth year]  MR#: [id number]  WSF#:[id number]  Patient Care Team: Hendricks Limes, MD as PCP - General Chauncey Cruel, MD as Consulting Physician (Oncology) [name], MD as Consulting Physician (General Surgery) PCP: Unice Cobble, MD GYN: OTHER MD: Crista Luria MD, Dorna Leitz MD  CHIEF COMPLAINT:  Noninvasive ductal breast cancer  CURRENT TREATMENT:  Awaiting definitive surgery   BREAST CANCER HISTORY:  Jennifer Park had bilateral screening mammography at [facility] 12/21/2014 showing coarse calcifications in the lower outer left breast and a few tightly grouped coarse calcifications in the right breast  On 01/03/2015 she underwent bilateral diagnostic mammography with tomosynthesis and breast ultrasonography at [facility]. This showed the breast density to be category C. In the right breast the calcifications central to the nipple were felt to be likely benign .   In the left breast however there were 3 areas of calcifications felt to be indeterminant. Ultrasonography showed a 1.3 cm lobulated complex cyst at the 3:00 position of the left breast with no vascularity. 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 The second area of calcifications was not biopsied. There is a distance of approximately 3 cm between the 2 clusters.   the patient's subsequent history is as detailed below   INTERVAL HISTORY:  Jennifer Park was evaluated in the multidisciplinary breast cancer conference 01/17/2015 accompanied by her husband Jennifer Park Her case was also presented in the multidisciplinary breast cancer conference that same morning. At that time a preliminary plan was proposed:  Consider MRI of  the breast preop (this plan however was abandoned because of the patient's severe claustrophobia). Barring that, consider biopsy of the second area of concern in the left breast, and if positive proceed to mastectomy; if negative proceed with breast conservation , radiation and antiestrogen to as appropriate  REVIEW OF SYSTEMS: There were no specific symptoms leading to the original mammogram, which was routinely scheduled. The patient denies unusual headaches, visual changes, nausea, vomiting, stiff neck, dizziness, or gait imbalance. There has been no cough, phlegm production, or pleurisy, no chest pain or pressure, and no change in bowel or bladder habits. The patient denies fever, rash, bleeding, unexplained fatigue or unexplained weight loss. Jennifer Park admits to seasonal allergies and occasional shortness of breath when climbing stairs. She describes herself is anxious and depressed. She denies significant hot flashes or vaginal dryness symptoms. She does not exercise regularly.A detailed review of systems was otherwise entirely negative.  PAST MEDICAL HISTORY: Past Medical History  Diagnosis Date  . Anxiety     situational  . Hyperlipidemia   . Hypertension   . Allergy   . Breast cancer of lower-outer quadrant of left female breast ([place]) 01/12/2015  . Claustrophobia   . Depression     PAST SURGICAL HISTORY: Past Surgical History  Procedure Laterality Date  . Ganglion cyst excision    . Abdominal hysterectomy      for abnormal cytology; Dr Irven [place]  . Tonsillectomy    . No colonoscopy      11/17/13 SOC reviewed  . Varicose vein surgery      as OP X2  . Cesarean section      FAMILY HISTORY Family History  Problem Relation Age of Onset  . Bipolar disorder  Mother   . Breast cancer Mother   . Cancer Mother   . Diabetes Father   . Kidney failure Father   . Heart failure Father   . COPD Father   . Heart disease Father   . Hyperlipidemia Father   . Bipolar disorder Sister   .  Heart attack Maternal Grandfather     in late 100s  . Stroke Maternal Grandmother     in 90s  the patient's father died with congestive 40 failure and COPD at the age of 77. He had 10 siblings some of whom may have had some kind of cancer, with a history is vague. The patient's mother is 60 years old. She was diagnosed with breast cancer in her 88s.The patient had no brothers. She has one sister.there is no other history of breast or ovarian cancer in the family to the patient's knowledge  GYNECOLOGIC HISTORY:  No LMP recorded. Patient is postmenopausal. Menarche age 45, first live birth age 45, which the patient understands can increase the risk of breast cancer. The patient is GX P1. She took hormone replacement approximately 20 years with no complications.She underwent hysterectomy approximately age 67. She did not take hormone replacement. Her ovaries are still in place.  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 Her husband and is a Armed forces technical officer. Their son Jennifer Park is a Ship broker.    ADVANCED DIRECTIVES: not in place   HEALTH MAINTENANCE: Social History  Substance Use Topics  . Smoking status: Former Smoker    Quit date: 03/31/1984  . Smokeless tobacco: Never Used     Comment: age [age] , up to 1 ppd  . Alcohol Use: 2.4 oz/week    4 Glasses of wine per week     Colonoscopy:never  PNT:IRWERX post hysterectomy  Bone density:never  Lipid panel:  Allergies  Allergen Reactions  . Codeine Nausea Only  . Morphine Nausea And Vomiting    REACTION: vomiting     Current Outpatient Prescriptions  Medication Sig Dispense Refill  . aspirin-acetaminophen-caffeine (EXCEDRIN MIGRAINE) 250-250-65 MG tablet Take 2 tablets by mouth every 6 (six) hours as needed for headache.    . cetirizine (ZYRTEC) 10 MG tablet Take 10 mg by mouth daily.    . fluticasone (FLONASE) 50 MCG/ACT nasal spray Place into both nostrils daily.    . furosemide (LASIX) 20 MG tablet  1 qd prn-- must have office visit for further refills 30 tablet 0  . ipratropium (ATROVENT) 0.03 % nasal spray Place 2 sprays into both nostrils 2 (two) times daily. 30 mL 0  . omeprazole (PRILOSEC) 10 MG capsule Take 10 mg by mouth daily.    . sertraline (ZOLOFT) 50 MG tablet Take 1 tablet (50 mg total) by mouth daily. 30 tablet 0  . oxybutynin (DITROPAN) 5 MG tablet TAKE 1 TABLET EVERY DAY 30 tablet 0  . [DISCONTINUED] TOVIAZ 4 MG TB24 TAKE 1 TABLET BY MOUTH EVERY DAY 30 tablet 5   No current facility-administered medications for this visit.    OBJECTIVE: middle-aged white woman who appears younger than stated age 29 Vitals:   01/17/15 0855  BP: 132/82  Pulse: 74  Temp: 97.5 F (36.4 C)  Resp: 18     Body mass index is 40.51 kg/(m^2).    ECOG FS:0 - Asymptomatic  Ocular: Sclerae unicteric, pupils equal, round and reactive to light Ear-nose-throat: Oropharynx clear and moist Lymphatic: No cervical or supraclavicular adenopathy Lungs no rales or rhonchi, good  excursion bilaterally Heart regular rate and rhythm, no murmur appreciated Abd soft, obese, nontender, positive bowel sounds MSK no focal spinal tenderness, no joint edema Neuro: non-focal, well-oriented, appropriate affect Breasts: the right breast is unremarkable. The left breast is status post recent biopsy. There is an ecchymosis associated with this. There are no other skin or nipple changes of concern. The left axilla is benign.   LAB RESULTS:  CMP     Component Value Date/Time   NA 143 01/17/2015 0831   NA 140 07/07/2012 1020   K 3.7 01/17/2015 0831   K 3.7 07/07/2012 1020   CL 104 07/07/2012 1020   CO2 25 01/17/2015 0831   CO2 29 07/07/2012 1020   GLUCOSE 78 01/17/2015 0831   GLUCOSE 81 07/07/2012 1020   BUN 12.6 01/17/2015 0831   BUN 9 07/07/2012 1020   CREATININE 0.8 01/17/2015 0831   CREATININE 0.8 07/07/2012 1020   CALCIUM 9.0 01/17/2015 0831   CALCIUM 8.5 07/07/2012 1020   PROT 6.7 01/17/2015  0831   PROT 7.4 11/23/2013 0803   ALBUMIN 3.5 01/17/2015 0831   ALBUMIN 3.8 11/23/2013 0803   AST 28 01/17/2015 0831   AST 38* 11/23/2013 0803   ALT 33 01/17/2015 0831   ALT 31 11/23/2013 0803   ALKPHOS 111 01/17/2015 0831   ALKPHOS 88 11/23/2013 0803   BILITOT 0.47 01/17/2015 0831   BILITOT 0.5 11/23/2013 0803   GFRNONAA 81 06/30/2007 0000   GFRAA 98 06/30/2007 0000    INo results found for: SPEP, UPEP  Lab Results  Component Value Date   WBC 5.4 01/17/2015   NEUTROABS 3.7 01/17/2015   HGB 13.9 01/17/2015   HCT 43.1 01/17/2015   MCV 87.1 01/17/2015   PLT 185 01/17/2015      Chemistry      Component Value Date/Time   NA 143 01/17/2015 0831   NA 140 07/07/2012 1020   K 3.7 01/17/2015 0831   K 3.7 07/07/2012 1020   CL 104 07/07/2012 1020   CO2 25 01/17/2015 0831   CO2 29 07/07/2012 1020   BUN 12.6 01/17/2015 0831   BUN 9 07/07/2012 1020   CREATININE 0.8 01/17/2015 0831   CREATININE 0.8 07/07/2012 1020      Component Value Date/Time   CALCIUM 9.0 01/17/2015 0831   CALCIUM 8.5 07/07/2012 1020   ALKPHOS 111 01/17/2015 0831   ALKPHOS 88 11/23/2013 0803   AST 28 01/17/2015 0831   AST 38* 11/23/2013 0803   ALT 33 01/17/2015 0831   ALT 31 11/23/2013 0803   BILITOT 0.47 01/17/2015 0831   BILITOT 0.5 11/23/2013 0803       No results found for: LABCA2  No components found for: LABCA125  No results for input(s): INR in the last 168 hours.  Urinalysis No results found for: COLORURINE, APPEARANCEUR, LABSPEC, PHURINE, GLUCOSEU, HGBUR, BILIRUBINUR, KETONESUR, PROTEINUR, UROBILINOGEN, NITRITE, LEUKOCYTESUR  STUDIES: No results found.  ASSESSMENT: 54 y.o. 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  (a) a second area of suspicious calcifications in the left breast is pending biopsy  (1) definitive surgery pending  (2) adjuvant radiation  as appropriate  (3) consider anti-estrogens after local treatment  PLAN: Jennifer Park understands that in noninvasive ductal carcinoma, also called ductal carcinoma in situ ("DCIS") the breast cancer cells remain trapped in the ducts were they started. They cannot travel to a vital organ. For that reason these cancers in  themselves are not life-threatening.  If the whole breast is removed then all the ducts are removed and since the cancer cells are trapped in the ducts, the cure rate with mastectomy for noninvasive breast cancer is approximately 99%. Nevertheless we recommend lumpectomy, because there is no survival advantage to mastectomy and because the cosmetic result is generally superior with breast conservation.  However in her specific case, if the second area of calcifications proves to be malignant, we will recommend mastectomy. She will have of course several reconstruction options. If the second biopsy is benign and concordant we will proceed with breast conservation followed by adjuvant radiation as initially suggested.  Jennifer Park is probably postmenopausal at this point but we will check an [facility] and estradiol level prior to her return visit with me. It would be helpful if she also had a bone density but we will only obtain that if she decides to try an aromatase inhibitor and shows that she can tolerate it.  The patient has a good understanding of the overall plan. She agrees with it. She knows the goal of treatment in her case is cure. She will call with any problems that may develop before her next visit here.  Chauncey Cruel, MD   01/17/2015 11:41 AM Medical Oncology and Hematology [facility] [address] [place], [place] [address] Tel. [phone]    Fax. [phone]

## 2015-01-17 NOTE — Therapy (Signed)
[place], [place], [address] Phone: [phone]   Fax:  [phone]  Physical Therapy Evaluation  Patient Details  Name: Jennifer Park MRN: [id number] Date of Birth: Aug 06, 1960 Referring Provider: Dr. Autumn Messing  Encounter Date: 01/17/2015      PT End of Session - 01/17/15 1138    Visit Number 1   Number of Visits 1   PT Start Time 1100   PT Stop Time 0272  Also saw pt from 935 - 942   PT Time Calculation (min) 22 min   Activity Tolerance Patient tolerated treatment well   Behavior During Therapy [facility] for tasks assessed/performed      Past Medical History  Diagnosis Date  . Anxiety     situational  . Hyperlipidemia   . Hypertension   . Allergy   . Breast cancer of lower-outer quadrant of left female breast ([place]) 01/12/2015  . Claustrophobia   . Depression     Past Surgical History  Procedure Laterality Date  . Ganglion cyst excision    . Abdominal hysterectomy      for abnormal cytology; Dr Irven [place]  . Tonsillectomy    . No colonoscopy      11/17/13 SOC reviewed  . Varicose vein surgery      as OP X2  . Cesarean section      There were no vitals filed for this visit.  Visit Diagnosis:  Carcinoma of lower outer quadrant of left breast ([place]) - Plan: PT plan of care cert/re-cert  Abnormal posture - Plan: PT plan of care cert/re-cert      Subjective Assessment - 01/17/15 1138    Pertinent History Patient was diagnosed on 12/25/14 with left high grade DCIS breast cancer.  It is located in the lower outer quadrant and is ER/PR positive measuring 3 cm.  She has another area that will require a second biopsy if she wants to have a lumpectomy.            [facility] PT Assessment - 01/17/15 0001    Assessment   Medical Diagnosis Left breast cancer   Referring Provider Dr. Autumn Messing   Onset Date/Surgical Date 12/25/14   Hand Dominance Right   Prior Therapy none   Precautions   Precautions Other (comment)  Active left breast cancer   Restrictions   Weight Bearing Restrictions No   Balance Screen   Has the patient fallen in the past 6 months No   Has the patient had a decrease in activity level because of a fear of falling?  No   Is the patient reluctant to leave their home because of a fear of falling?  No   Home Environment   Living Environment Private residence   Living Arrangements Spouse/significant other;Children  Husband and 79 y.o. son   Available Help at Discharge Family   Prior Function   Level of Independence Independent   Vocation Full time employment   Retail buyer in [place] She walks 2x/week for 1 hour   Cognition   Overall Cognitive Status Within Functional Limits for tasks assessed   Posture/Postural Control   Posture/Postural Control Postural limitations   Postural Limitations Rounded Shoulders;Forward head   ROM / Strength   AROM / PROM / Strength AROM;Strength   AROM   AROM Assessment Site Shoulder   Right/Left Shoulder Right;Left   Right Shoulder Extension 47 Degrees   Right Shoulder Flexion 146 Degrees   Right  Shoulder ABduction 165 Degrees   Right Shoulder Internal Rotation 65 Degrees   Right Shoulder External Rotation 80 Degrees   Left Shoulder Extension 42 Degrees   Left Shoulder Flexion 147 Degrees   Left Shoulder ABduction 152 Degrees   Left Shoulder Internal Rotation 47 Degrees   Left Shoulder External Rotation 65 Degrees   Strength   Overall Strength Within functional limits for tasks performed           LYMPHEDEMA/ONCOLOGY QUESTIONNAIRE - 01/17/15 1134    Type   Cancer Type Left breast cancer   Lymphedema Assessments   Lymphedema Assessments Upper extremities   Right Upper Extremity Lymphedema   10 cm Proximal to Olecranon Process 36.5 cm   Olecranon Process 28.4 cm   10 cm Proximal to Ulnar Styloid Process 23.8 cm   Just Proximal to Ulnar Styloid Process 16.9 cm    Across Hand at [employer] 19.1 cm   At [place] of 2nd Digit 7.2 cm   Left Upper Extremity Lymphedema   10 cm Proximal to Olecranon Process 36.9 cm   Olecranon Process 27.3 cm   10 cm Proximal to Ulnar Styloid Process 23.3 cm   Just Proximal to Ulnar Styloid Process 16.9 cm   Across Hand at [employer] 19.5 cm   At [place] of 2nd Digit 7 cm      Patient was instructed today in a home exercise program today for post op shoulder range of motion. These included active assist shoulder flexion in sitting, scapular retraction, wall walking with shoulder abduction, and hands behind head external rotation.  She was encouraged to do these twice a day, holding 3 seconds and repeating 5 times when permitted by her physician.         PT Education - 01/17/15 1137    Education provided Yes   Education Details Post op shoulder ROM HEP   Person(s) Educated Patient;Spouse   Methods Explanation;Demonstration;Handout   Comprehension Returned demonstration;Verbalized understanding              Breast Clinic Goals - 01/17/15 1141    Patient will be able to verbalize understanding of pertinent lymphedema risk reduction practices relevant to her diagnosis specifically related to skin care.   Time 1   Period Days   Status Achieved   Patient will be able to return demonstrate and/or verbalize understanding of the post-op home exercise program related to regaining shoulder range of motion.   Time 1   Period Days   Status Achieved   Patient will be able to verbalize understanding of the importance of attending the postoperative After Breast Cancer Class for further lymphedema risk reduction education and therapeutic exercise.   Time 1   Period Days   Status Achieved              Plan - 01/17/15 1139    Clinical Impression Statement Patient was diagnosed on 12/25/14 with left high grade DCIS breast cancer.  It is located in the lower outer quadrant and is ER/PR positive measuring 3 cm.   She has another area that will require a second biopsy.  If that area is positive, she will likely have a mastectomy with sentinel node biopsy and immediate reconstruction.  If it is negative, she will be a candidate for a lumpectomy and sentinel node biopsy.  She will undergo radiation if she has a lumpectomy and will takean anti-estrogen medication.  She will benefit from post op PT especially if she has a  mastectomy to regain shoulder ROM and reduce lymphedema risk.   Pt will benefit from skilled therapeutic intervention in order to improve on the following deficits Decreased strength;Decreased scar mobility;Pain;Decreased knowledge of precautions;Impaired UE functional use;Decreased range of motion   Rehab Potential Excellent   Clinical Impairments Affecting Rehab Potential none   PT Frequency One time visit   PT Treatment/Interventions Therapeutic exercise;Patient/family education   Consulted and Agree with Plan of Care Patient;Family member/caregiver   Family Member Consulted Husband     Patient will follow up at outpatient cancer rehab if needed following surgery.  If the patient requires physical therapy at that time, a specific plan will be dictated and sent to the referring physician for approval. The patient was educated today on appropriate basic range of motion exercises to begin post operatively and the importance of attending the After Breast Cancer class following surgery.  Patient was educated today on lymphedema risk reduction practices as it pertains to recommendations that will benefit the patient immediately following surgery.  She verbalized good understanding.  No additional physical therapy is indicated at this time.       Problem List Patient Active Problem List   Diagnosis Date Noted  . Breast cancer of lower-outer quadrant of left female breast ([place]) 01/12/2015  . Adhesive capsulitis 11/23/2013  . Subacromial bursitis 11/23/2013  . Varicose veins 11/17/2013  . Edema  11/17/2013  . HYPERLIPIDEMIA 04/16/2010  . Elevated blood pressure reading without diagnosis of hypertension 04/16/2010  . ANXIETY STATE NOS 12/15/2006  . DYSFUNCTION, BLADDER NEC 12/15/2006   Annia Friendly, PT 01/17/2015 11:46 AM  [place], [place], [address] Phone: [phone]   Fax:  [phone]  Name: Jennifer Park MRN: [id number] Date of Birth: 04-11-1960

## 2015-01-17 NOTE — Progress Notes (Signed)
Ms. Ryder is a very pleasant 54 y.o. 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  Biopsy results revealed the tumor's prognostic profile is ER positive and PR positive.  She presents today with her husband to the [facility]) for treatment consideration and recommendations from the breast surgeon, radiation oncologist, and medical oncologist.     I briefly met with Ms. Klink and her husband during her [facility] visit today. We discussed the purpose of the Survivorship Clinic, which will include monitoring for recurrence, coordinating completion of age and gender-appropriate cancer screenings, promotion of overall wellness, as well as managing potential late/long-term side effects of anti-cancer treatments.  We also reviewed the various support programs offered by the [facility] through the Patient and Family Support program.  Printed information, along with a calendar of events and our social worker's, Abigail Elmore's, contact information was provided to the patient and she was encouraged to take part in these valuable programs.  The treatment plan for Ms. Zaborowski will likely include surgery and radiation therapy (pending results of additional biopsy to be performed tomorrow, 01/18/2015) as well as anti-estrogen therapy.  As of today, the intent of treatment for Ms. Tech is cure, therefore she will be eligible for the Survivorship Clinic upon her completion of treatment.  Her survivorship care plan (SCP) document will be drafted and updated throughout the course of her treatment trajectory. She will receive the SCP in an office visit with myself in the Survivorship Clinic once she has completed treatment.   Ms. Jr was encouraged to ask questions and all questions were answered to her satisfaction.  She was given my business card and encouraged to contact me with any concerns regarding  survivorship.  I look forward to participating in her care.   Kenn File, [place] [phone]

## 2015-01-17 NOTE — Patient Instructions (Signed)

## 2015-01-17 NOTE — Progress Notes (Signed)
Radiation Oncology         [phone]) [phone] ________________________________  Initial outpatient Consultation - Date: 01/17/2015   Name: Jennifer Park MRN: [id number]   DOB: 06/27/1960  REFERRING PHYSICIAN: Hendricks Limes, MD  DIAGNOSIS AND STAGE: Stage 0 DCIS of the Left Breast  HISTORY OF PRESENT ILLNESS::Jennifer Park is a 54 y.o. female  who underwent screening mammogram and was found to have left breast calicifcations measuring about 3 cm in the lower outer quadrant. There was another cluster of calcifications that was suspicious about 3 cm away from the first cluster of calcifications. A biopsy of the first cluster of califications was performed which showed intermediate to high grade DCIS which was ER/PR positive. A biopsy of other calcifications was recommended if breast conservation is being considered. She possibly have a family history of breast cancer in her mother who was bipolar and true illnesses are difficult to discern.   She is accompanied by her husband. She lives in [place] and works in [place].   GYN History:  The patient's menarche occurred at age 29 She is post-menopausal. She had a hysterectomy 5 years ago She has not used hormone replacement She is GxP1, with her first live birth at age 17 She is not trying to get pregnant She has used birth control pills for contraception for 20 years  She has never had a colonoscopy She has never had a bone density Last PAP smear in October 2016   PREVIOUS RADIATION THERAPY: No  Past medical, social and family history were reviewed in the electronic chart. Review of symptoms was reviewed in the electronic chart. Medications were reviewed in the electronic chart.   PHYSICAL EXAM:  Vitals with Age-Percentiles 01/17/2015  Length 546 cm  Systolic 568  Diastolic 82  Pulse 74  Respiration 18  Weight 122.653 kg  BMI 40.6   Right breast unremarkable. Bruising over her left breast with associated palpable biopsy cavity  underneath. No palpable left or right axillary lymph nodes.  5/5  strength in upper extremities Alert and Oriented x 3  IMPRESSION: Stage 0 DCIS of Left Breast  PLAN:I spoke to the patient today regarding her diagnosis and options for treatment. We discussed the equivalence in terms of survival and local failure between mastectomy and breast conservation. We discussed the role of radiation in decreasing local failures in patients who undergo lumpectomy. We discussed the process of simulation and the placement tattoos. We discussed 6 weeks of treatment as an outpatient. We discussed the possibility of asymptomatic lung damage. We discussed the low likelihood of secondary malignancies. We discussed the possible side effects including but not limited to skin redness, fatigue, permanent skin darkening, and breast swelling.  We discussed the use of cardiac sparing with deep inspiration breath hold if needed.  She is going to have a second biopsy and if this is positive, we would recommend a mastectomy due to extensive disease. If negative, she would be a candidate for lumpectomy. She lives near the hospital but works in [place] so she prefers early morning or later afternoon appointments. She and her husband have a trip to [place] after Thanksgiving. I said it would be ok to begin treatment after this trip.    The patient met with surgery, medical oncology, social work, physical therapy and nutrition.   I spent 40 minutes  face to face with the patient and more than 50% of that time was spent in counseling and/or coordination of care.   ------------------------------------------------  Thea Silversmith, MD  This document serves as a record of services personally performed by Thea Silversmith, MD. It was created on her behalf by Derek Mound, a trained medical scribe. The creation of this record is based on the scribe's personal observations and the provider's statements to them. This document has been  checked and approved by the attending provider.

## 2015-01-17 NOTE — Progress Notes (Signed)
Subjective:     Patient ID: Jennifer Park, female   DOB: [birth year], 54 y.o.   MRN: [id number]  HPI   Review of Systems     Objective:   Physical Exam For the patient to understand and be given the tools to implement a healthy plant based diet during their cancer diagnosis.     Assessment:     Patient was seen today and found to be in good spiritis and accompanied by her husband. Pts co2 111. Pt is 5'8'' 270 pounds, and BMI 40.6. Pt is taking omeprezole. Pts husband states he does the cooking. Pt states she will be starting a walking club.     Plan:     Dietitian educated the patient on implementing a plant based diet by incorporating more plant proteins, fruits, and vegetables. As a part of a healthy routine physical activity was discussed. A folder of evidence based information with a focus on a plant based diet and general nutrition during cancer was given to the patient.  The importance of legitimate, evidence based information was discussed and examples were given. As a part of the continuum of care the cancer dietitian's contact information was given to the patient in the event they would like to have a follow up appointment.

## 2015-01-18 ENCOUNTER — Other Ambulatory Visit: Payer: Self-pay | Admitting: Radiology

## 2015-01-18 ENCOUNTER — Telehealth: Payer: Self-pay | Admitting: Oncology

## 2015-01-18 NOTE — Telephone Encounter (Signed)
Appointments made and avs mailed to patient °

## 2015-01-19 ENCOUNTER — Telehealth: Payer: Self-pay | Admitting: *Deleted

## 2015-01-19 ENCOUNTER — Telehealth: Payer: Self-pay | Admitting: Internal Medicine

## 2015-01-19 NOTE — Telephone Encounter (Signed)
Spoke to pt regarding [place] from 01/17/15. Denies questions or concerns regarding dx. Relate now since her bx is negative she is unsure on her surgery and if she should have a mastectomy vs, lumpectomy, vs bilateral mastectomies. Informed pt those questions are best answered by Dr. Marlou Starks and that I would request that he call her to discuss her options. Encourage pt to call with further questions or needs. Received verbal understanding. Contact information given.

## 2015-01-19 NOTE — Telephone Encounter (Signed)
Pt was wondering if Dr. Linna Darner can give her couple month of refill for sertraline (ZOLOFT) 50 MG tablet; pt stated that she diagnosed with the breast cancer and she has a lot of appt to go to, pt is asking to see if Dr. Linna Darner will give her couple month supply and she will come in for a follow up. Please help, pt is out this medication as of today. [facility]   Call back # [phone]

## 2015-01-19 NOTE — Telephone Encounter (Signed)
OK #30 She may want to schedule appt with Dr Quay Burow

## 2015-01-22 ENCOUNTER — Other Ambulatory Visit: Payer: Self-pay | Admitting: Oncology

## 2015-01-22 ENCOUNTER — Other Ambulatory Visit: Payer: Self-pay | Admitting: Emergency Medicine

## 2015-01-22 MED ORDER — SERTRALINE HCL 50 MG PO TABS: 50.0000 mg | ORAL_TABLET | Freq: Every day | ORAL | Status: DC

## 2015-01-22 NOTE — Progress Notes (Unsigned)
I called Jennifer Park and discussed the negative biopsy with her. It gives her the option of keeping her breasts. However she is concerned that there may need to be more surgery if margins are close or positive, that she will need radiation if she doesn't do a mastectomy, and that she really may not be able to feel comfortable with the surgery less she has a mastectomy. Those are all cogent reasons.  She also is interested in bilateral mastectomies. I suggested she call her insurance company to see they would pay for that and also whether they would pay for contralateral reconstruction. She can then meet with Dr. Marlou Starks next week and finalize the plan.  she is not planning to have her final surgery until December because of a trip to [place] long planned. Accordingly I will move her appointment with me to January.

## 2015-01-31 ENCOUNTER — Other Ambulatory Visit: Payer: Self-pay | Admitting: General Surgery

## 2015-01-31 DIAGNOSIS — D0512 Intraductal carcinoma in situ of left breast: Secondary | ICD-10-CM

## 2015-02-09 ENCOUNTER — Other Ambulatory Visit: Payer: Self-pay | Admitting: *Deleted

## 2015-02-11 ENCOUNTER — Telehealth: Payer: Self-pay | Admitting: Oncology

## 2015-02-11 NOTE — Telephone Encounter (Signed)
Called and left a message with new appointments per 11/11 pof

## 2015-02-20 ENCOUNTER — Other Ambulatory Visit: Payer: Managed Care, Other (non HMO)

## 2015-02-20 ENCOUNTER — Telehealth: Payer: Self-pay | Admitting: Internal Medicine

## 2015-02-20 NOTE — Telephone Encounter (Signed)
Pt has called in regarding her prescriptions for furosemide (LASIX) 20 MG tablet [id number] and sertraline (ZOLOFT) 50 MG tablet [id number].  She was hoping you could fill these prescriptions for a few more months before she comes in. She was recently diagnosed with breast cancer and will be having a double mastectomy next month. She is leaving out of town on Thanksgiving day and will be returning 12/6. The next week after that she will be having her surgery. She's hoping to wait for the appointment because of everything is going on. I have made an appointment for her in February  Please advise

## 2015-02-20 NOTE — Telephone Encounter (Signed)
Please advise 

## 2015-02-20 NOTE — Telephone Encounter (Signed)
Ok to refill until feb

## 2015-02-21 ENCOUNTER — Other Ambulatory Visit: Payer: Self-pay | Admitting: Emergency Medicine

## 2015-02-21 MED ORDER — SERTRALINE HCL 50 MG PO TABS: 50.0000 mg | ORAL_TABLET | Freq: Every day | ORAL | Status: DC

## 2015-02-21 MED ORDER — FUROSEMIDE 20 MG PO TABS: ORAL_TABLET | ORAL | Status: DC

## 2015-02-21 NOTE — Telephone Encounter (Signed)
Spoke with pt to inform.  

## 2015-02-23 ENCOUNTER — Telehealth: Payer: Self-pay | Admitting: *Deleted

## 2015-02-23 NOTE — Telephone Encounter (Signed)
-----   Message from Rockwell Germany, RN sent at 02/12/2015  2:41 PM EST ----- Regarding: Care Plan Patient was seen in [facility] 10/19.  She is scheduled for the following.  Surgery - 12/14 (Bilateral Mastectomy) Dr. Jana Hakim - 1/10  Please let me know if you have any questions.  Jennifer Park

## 2015-03-06 NOTE — Pre-Procedure Instructions (Addendum)
    Ellah A Cryer  03/06/2015      [id number] [place], [place] [address] [address] Phone: [phone] Fax: [place] [phone] Phone: [phone] Fax: [phone]    Your procedure is scheduled on Wednesday, December 14.  Report to [facility] Admitting at 7:00A.M.             Your surgery or procedure is scheduled for 9:00 A.M.   Call this number if you have problems the morning of surgery: [phone]                  For any other questions, please call [phone], Monday - Friday 8 AM - 4 PM.   Remember:  Do not eat food or drink liquids after midnight Tuesday, December 13.  Take these medicines the morning of surgery with A SIP OF WATER : zyrtec, flonase, prilosec, zoloft                 Stop taking Aspirin, Coumadin, Plavix, Effient and Herbal medications.  Do not take any NSAIDs ie: Ibuprofen,  Advil,Naproxen or any medication containing Aspirin.   Do not wear jewelry, make-up or nail polish.   Do not wear lotions, powders, or perfumes.     Do not shave 48 hours prior to surgery.     Do not bring valuables to the hospital.   [facility] is not responsible for any belongings or valuables.  Contacts, dentures or bridgework may not be worn into surgery.  Leave your suitcase in the car.  After surgery it may be brought to your room.  For patients admitted to the hospital, discharge time will be determined by your treatment team.  Patients discharged the day of surgery will not be allowed to drive home.     Special instructions:  Review  [facility] - Preparing For Surgery.  Please read over the following fact sheets that you were given. Pain Booklet, Coughing and Deep Breathing and Surgical Site Infection Prevention

## 2015-03-07 ENCOUNTER — Encounter (HOSPITAL_COMMUNITY)
Admission: RE | Admit: 2015-03-07 | Discharge: 2015-03-07 | Disposition: A | Discharge status: Home / Self Care | Payer: Managed Care, Other (non HMO) | Source: Ambulatory Visit | Attending: General Surgery | Admitting: General Surgery

## 2015-03-07 ENCOUNTER — Encounter (HOSPITAL_COMMUNITY): Payer: Self-pay

## 2015-03-07 DIAGNOSIS — Z01818 Encounter for other preprocedural examination: Secondary | ICD-10-CM | POA: Diagnosis present

## 2015-03-07 DIAGNOSIS — F419 Anxiety disorder, unspecified: Secondary | ICD-10-CM | POA: Diagnosis not present

## 2015-03-07 DIAGNOSIS — F329 Major depressive disorder, single episode, unspecified: Secondary | ICD-10-CM | POA: Diagnosis not present

## 2015-03-07 DIAGNOSIS — Z79899 Other long term (current) drug therapy: Secondary | ICD-10-CM | POA: Diagnosis not present

## 2015-03-07 DIAGNOSIS — E785 Hyperlipidemia, unspecified: Secondary | ICD-10-CM | POA: Insufficient documentation

## 2015-03-07 DIAGNOSIS — Z01812 Encounter for preprocedural laboratory examination: Secondary | ICD-10-CM | POA: Diagnosis not present

## 2015-03-07 DIAGNOSIS — D0512 Intraductal carcinoma in situ of left breast: Secondary | ICD-10-CM | POA: Diagnosis not present

## 2015-03-07 DIAGNOSIS — F40240 Claustrophobia: Secondary | ICD-10-CM | POA: Insufficient documentation

## 2015-03-07 DIAGNOSIS — Z87891 Personal history of nicotine dependence: Secondary | ICD-10-CM | POA: Insufficient documentation

## 2015-03-07 HISTORY — DX: Anemia, unspecified: D64.9

## 2015-03-07 HISTORY — DX: Other specified postprocedural states: R11.2

## 2015-03-07 HISTORY — DX: Other specified postprocedural states: Z98.890

## 2015-03-07 HISTORY — DX: Gastro-esophageal reflux disease without esophagitis: K21.9

## 2015-03-07 LAB — BASIC METABOLIC PANEL
ANION GAP: 8 (ref 5–15)
BUN: 10 mg/dL (ref 6–20)
CALCIUM: 9.1 mg/dL (ref 8.9–10.3)
CO2: 28 mmol/L (ref 22–32)
Chloride: 107 mmol/L (ref 101–111)
Creatinine, Ser: 0.85 mg/dL (ref 0.44–1.00)
Glucose, Bld: 96 mg/dL (ref 65–99)
POTASSIUM: 4 mmol/L (ref 3.5–5.1)
Sodium: 143 mmol/L (ref 135–145)

## 2015-03-07 LAB — CBC
HEMATOCRIT: 44.6 % (ref 36.0–46.0)
Hemoglobin: 14.2 g/dL (ref 12.0–15.0)
MCH: 27.9 pg (ref 26.0–34.0)
MCHC: 31.8 g/dL (ref 30.0–36.0)
MCV: 87.6 fL (ref 78.0–100.0)
PLATELETS: 187 10*3/uL (ref 150–400)
RBC: 5.09 MIL/uL (ref 3.87–5.11)
RDW: 14 % (ref 11.5–15.5)
WBC: 5.8 10*3/uL (ref 4.0–10.5)

## 2015-03-07 NOTE — Progress Notes (Signed)
PCP: Dr. Peyton Bottoms. Linna Darner

## 2015-03-07 NOTE — Progress Notes (Signed)
Anesthesia PAT Evaluation: Patient is a 54 year old female scheduled for bilateral mastectomies on 03/14/15 by Dr. Marlou Starks. DX left breast DCIS.  History includes former smoker, post-operative N/V, anxiety, claustrophobia, depression, HLD, anemia, breast cancer, hysterectomy, tonsillectomy, varicose veins. PCP is Dr. Unice Cobble, who is retiring at the end of the year.  Meds include Excedrin Migraine, Flonase, Lasix, Prilosec, Zoloft.   PAT Vitals: BP 105/67, RR 20, HR 76, T 36.7C, 95% O2 sat.  Preoperative labs noted.  03/07/15 EKG: NSR, minimal voltage criteria for LVH, may be normal variant. Reverse r wave progression in V2-3 is new when compared to 06/09/12 tracing.   Patient reports she just returned from a trip to [place] on Sunday. She started having a sore throat on the flight back. There is associated sinus drainage, but otherwise minimal symptoms. No fever or significant cough. She wanted to see if this would interfere with surgery plans. Lungs sound clear today. Heart RRR, no murmur noted. Mild throat erythema but no exudates noted. Probable viral pharyngitis. She was advised to monitor her symptoms. If she develops fever, chest congestion, productive cough, or other worsening symptoms then she was advised to follow-up with her PCP. If symptoms improve, she is afebrile, and lungs sounds clear at the time of her surgery then I think she will be able to proceed as planned. Fortunately, surgery is still a week away.    [facility] Stay [facility]/Anesthesiology Phone [phone] 03/07/2015 10:50 AM

## 2015-03-09 ENCOUNTER — Ambulatory Visit: Payer: Managed Care, Other (non HMO) | Admitting: Oncology

## 2015-03-14 ENCOUNTER — Ambulatory Visit (HOSPITAL_COMMUNITY)
Admission: RE | Admit: 2015-03-14 | Discharge: 2015-03-15 | Disposition: A | Discharge status: Home / Self Care | Payer: Managed Care, Other (non HMO) | Source: Ambulatory Visit | Attending: General Surgery | Admitting: General Surgery

## 2015-03-14 ENCOUNTER — Encounter (HOSPITAL_COMMUNITY)
Admission: RE | Disposition: A | Discharge status: Home / Self Care | Payer: Self-pay | Source: Ambulatory Visit | Attending: General Surgery

## 2015-03-14 ENCOUNTER — Encounter: Payer: Self-pay | Admitting: Internal Medicine

## 2015-03-14 ENCOUNTER — Encounter (HOSPITAL_COMMUNITY): Payer: Self-pay | Admitting: Certified Registered Nurse Anesthetist

## 2015-03-14 ENCOUNTER — Ambulatory Visit (HOSPITAL_COMMUNITY): Payer: Managed Care, Other (non HMO) | Admitting: Vascular Surgery

## 2015-03-14 ENCOUNTER — Ambulatory Visit (HOSPITAL_COMMUNITY): Payer: Managed Care, Other (non HMO) | Admitting: Anesthesiology

## 2015-03-14 ENCOUNTER — Ambulatory Visit (HOSPITAL_COMMUNITY)
Admission: RE | Admit: 2015-03-14 | Discharge: 2015-03-14 | Disposition: A | Discharge status: Home / Self Care | Payer: Managed Care, Other (non HMO) | Source: Ambulatory Visit | Attending: General Surgery | Admitting: General Surgery

## 2015-03-14 DIAGNOSIS — F329 Major depressive disorder, single episode, unspecified: Secondary | ICD-10-CM | POA: Insufficient documentation

## 2015-03-14 DIAGNOSIS — Z4001 Encounter for prophylactic removal of breast: Secondary | ICD-10-CM | POA: Insufficient documentation

## 2015-03-14 DIAGNOSIS — D0510 Intraductal carcinoma in situ of unspecified breast: Secondary | ICD-10-CM | POA: Diagnosis present

## 2015-03-14 DIAGNOSIS — Z170 Estrogen receptor positive status [ER+]: Secondary | ICD-10-CM | POA: Insufficient documentation

## 2015-03-14 DIAGNOSIS — Z6841 Body Mass Index (BMI) 40.0 and over, adult: Secondary | ICD-10-CM | POA: Diagnosis not present

## 2015-03-14 DIAGNOSIS — Z90711 Acquired absence of uterus with remaining cervical stump: Secondary | ICD-10-CM | POA: Insufficient documentation

## 2015-03-14 DIAGNOSIS — M1990 Unspecified osteoarthritis, unspecified site: Secondary | ICD-10-CM | POA: Insufficient documentation

## 2015-03-14 DIAGNOSIS — J45909 Unspecified asthma, uncomplicated: Secondary | ICD-10-CM | POA: Diagnosis not present

## 2015-03-14 DIAGNOSIS — F419 Anxiety disorder, unspecified: Secondary | ICD-10-CM | POA: Insufficient documentation

## 2015-03-14 DIAGNOSIS — D0512 Intraductal carcinoma in situ of left breast: Secondary | ICD-10-CM | POA: Insufficient documentation

## 2015-03-14 DIAGNOSIS — Z87891 Personal history of nicotine dependence: Secondary | ICD-10-CM | POA: Insufficient documentation

## 2015-03-14 DIAGNOSIS — Z885 Allergy status to narcotic agent status: Secondary | ICD-10-CM | POA: Diagnosis not present

## 2015-03-14 DIAGNOSIS — N6091 Unspecified benign mammary dysplasia of right breast: Secondary | ICD-10-CM | POA: Insufficient documentation

## 2015-03-14 DIAGNOSIS — N6011 Diffuse cystic mastopathy of right breast: Secondary | ICD-10-CM | POA: Insufficient documentation

## 2015-03-14 DIAGNOSIS — K219 Gastro-esophageal reflux disease without esophagitis: Secondary | ICD-10-CM | POA: Diagnosis not present

## 2015-03-14 HISTORY — PX: MASTECTOMY W/ SENTINEL NODE BIOPSY: SHX2001

## 2015-03-14 HISTORY — DX: Unspecified asthma, uncomplicated: J45.909

## 2015-03-14 SURGERY — MASTECTOMY WITH SENTINEL LYMPH NODE BIOPSY
Anesthesia: General | Site: Breast | Laterality: Bilateral

## 2015-03-14 MED ORDER — 0.9 % SODIUM CHLORIDE (POUR BTL) OPTIME
TOPICAL | Status: DC | PRN
  Administered 2015-03-14: 1000 mL

## 2015-03-14 MED ORDER — HEPARIN SODIUM (PORCINE) 5000 UNIT/ML IJ SOLN
5000.0000 [IU] | Freq: Three times a day (TID) | INTRAMUSCULAR | Status: DC
  Administered 2015-03-15: 5000 [IU] via SUBCUTANEOUS
  Filled 2015-03-14: qty 1

## 2015-03-14 MED ORDER — FENTANYL CITRATE (PF) 250 MCG/5ML IJ SOLN
INTRAMUSCULAR | Status: AC
  Filled 2015-03-14: qty 5

## 2015-03-14 MED ORDER — ONDANSETRON HCL 4 MG/2ML IJ SOLN: 4.0000 mg | Freq: Four times a day (QID) | INTRAMUSCULAR | Status: DC | PRN

## 2015-03-14 MED ORDER — PROPOFOL 10 MG/ML IV BOLUS
INTRAVENOUS | Status: DC | PRN
  Administered 2015-03-14: 150 mg via INTRAVENOUS
  Administered 2015-03-14: 50 mg via INTRAVENOUS

## 2015-03-14 MED ORDER — ONDANSETRON HCL 4 MG/2ML IJ SOLN
INTRAMUSCULAR | Status: DC | PRN
  Administered 2015-03-14: 4 mg via INTRAVENOUS

## 2015-03-14 MED ORDER — ONDANSETRON HCL 4 MG/2ML IJ SOLN
INTRAMUSCULAR | Status: AC
  Filled 2015-03-14: qty 2

## 2015-03-14 MED ORDER — PHENYLEPHRINE HCL 10 MG/ML IJ SOLN
10.0000 mg | INTRAVENOUS | Status: DC | PRN
  Administered 2015-03-14: 25 ug/min via INTRAVENOUS

## 2015-03-14 MED ORDER — ONDANSETRON 4 MG PO TBDP: 4.0000 mg | ORAL_TABLET | Freq: Four times a day (QID) | ORAL | Status: DC | PRN

## 2015-03-14 MED ORDER — PANTOPRAZOLE SODIUM 40 MG IV SOLR
40.0000 mg | Freq: Every day | INTRAVENOUS | Status: DC
  Administered 2015-03-14: 40 mg via INTRAVENOUS
  Filled 2015-03-14: qty 40

## 2015-03-14 MED ORDER — KCL IN DEXTROSE-NACL 20-5-0.9 MEQ/L-%-% IV SOLN
INTRAVENOUS | Status: DC
  Administered 2015-03-14 – 2015-03-15 (×2): via INTRAVENOUS
  Filled 2015-03-14 (×3): qty 1000

## 2015-03-14 MED ORDER — PHENYLEPHRINE HCL 10 MG/ML IJ SOLN
INTRAMUSCULAR | Status: AC
  Filled 2015-03-14: qty 1

## 2015-03-14 MED ORDER — PROPOFOL 10 MG/ML IV BOLUS
INTRAVENOUS | Status: AC
  Filled 2015-03-14: qty 20

## 2015-03-14 MED ORDER — OXYCODONE-ACETAMINOPHEN 5-325 MG PO TABS
1.0000 | ORAL_TABLET | ORAL | Status: DC | PRN
  Administered 2015-03-14 – 2015-03-15 (×5): 2 via ORAL
  Filled 2015-03-14 (×5): qty 2

## 2015-03-14 MED ORDER — OXYCODONE HCL 5 MG PO TABS: 5.0000 mg | ORAL_TABLET | Freq: Once | ORAL | Status: DC | PRN

## 2015-03-14 MED ORDER — MIDAZOLAM HCL 5 MG/5ML IJ SOLN
INTRAMUSCULAR | Status: DC | PRN
  Administered 2015-03-14: 2 mg via INTRAVENOUS

## 2015-03-14 MED ORDER — EPHEDRINE SULFATE 50 MG/ML IJ SOLN
INTRAMUSCULAR | Status: DC | PRN
  Administered 2015-03-14 (×4): 5 mg via INTRAVENOUS
  Administered 2015-03-14: 10 mg via INTRAVENOUS

## 2015-03-14 MED ORDER — SCOPOLAMINE 1 MG/3DAYS TD PT72
1.0000 | MEDICATED_PATCH | TRANSDERMAL | Status: DC
  Administered 2015-03-14: 1.5 mg via TRANSDERMAL
  Filled 2015-03-14: qty 1

## 2015-03-14 MED ORDER — LIDOCAINE HCL (CARDIAC) 20 MG/ML IV SOLN
INTRAVENOUS | Status: AC
  Filled 2015-03-14: qty 5

## 2015-03-14 MED ORDER — DEXTROSE 5 % IV SOLN
3.0000 g | INTRAVENOUS | Status: AC
  Administered 2015-03-14: 3 g via INTRAVENOUS
  Filled 2015-03-14: qty 3000

## 2015-03-14 MED ORDER — PHENYLEPHRINE HCL 10 MG/ML IJ SOLN
INTRAMUSCULAR | Status: DC | PRN
  Administered 2015-03-14: 80 ug via INTRAVENOUS
  Administered 2015-03-14: 40 ug via INTRAVENOUS
  Administered 2015-03-14: 80 ug via INTRAVENOUS
  Administered 2015-03-14 (×2): 40 ug via INTRAVENOUS
  Administered 2015-03-14: 80 ug via INTRAVENOUS
  Administered 2015-03-14: 40 ug via INTRAVENOUS

## 2015-03-14 MED ORDER — METHOCARBAMOL 500 MG PO TABS
500.0000 mg | ORAL_TABLET | Freq: Four times a day (QID) | ORAL | Status: DC | PRN
  Administered 2015-03-14 – 2015-03-15 (×2): 500 mg via ORAL
  Filled 2015-03-14 (×2): qty 1

## 2015-03-14 MED ORDER — FLUTICASONE PROPIONATE 50 MCG/ACT NA SUSP
1.0000 | Freq: Every day | NASAL | Status: DC
  Filled 2015-03-14: qty 16

## 2015-03-14 MED ORDER — CHLORHEXIDINE GLUCONATE 4 % EX LIQD: 1.0000 "application " | Freq: Once | CUTANEOUS | Status: DC

## 2015-03-14 MED ORDER — LIDOCAINE HCL (CARDIAC) 20 MG/ML IV SOLN
INTRAVENOUS | Status: DC | PRN
  Administered 2015-03-14: 50 mg via INTRAVENOUS

## 2015-03-14 MED ORDER — FENTANYL CITRATE (PF) 100 MCG/2ML IJ SOLN
INTRAMUSCULAR | Status: DC | PRN
  Administered 2015-03-14: 50 ug via INTRAVENOUS
  Administered 2015-03-14 (×2): 100 ug via INTRAVENOUS

## 2015-03-14 MED ORDER — PHENYLEPHRINE 40 MCG/ML (10ML) SYRINGE FOR IV PUSH (FOR BLOOD PRESSURE SUPPORT)
PREFILLED_SYRINGE | INTRAVENOUS | Status: AC
  Filled 2015-03-14: qty 10

## 2015-03-14 MED ORDER — GLYCOPYRROLATE 0.2 MG/ML IJ SOLN
INTRAMUSCULAR | Status: AC
  Filled 2015-03-14: qty 3

## 2015-03-14 MED ORDER — ROCURONIUM BROMIDE 100 MG/10ML IV SOLN
INTRAVENOUS | Status: DC | PRN
Start: 2015-03-14 — End: 2015-03-14
  Administered 2015-03-14: 30 mg via INTRAVENOUS
  Administered 2015-03-14: 20 mg via INTRAVENOUS
  Administered 2015-03-14: 50 mg via INTRAVENOUS

## 2015-03-14 MED ORDER — ROCURONIUM BROMIDE 50 MG/5ML IV SOLN
INTRAVENOUS | Status: AC
  Filled 2015-03-14: qty 2

## 2015-03-14 MED ORDER — NEOSTIGMINE METHYLSULFATE 10 MG/10ML IV SOLN
INTRAVENOUS | Status: DC | PRN
  Administered 2015-03-14: 4 mg via INTRAVENOUS

## 2015-03-14 MED ORDER — FENTANYL CITRATE (PF) 100 MCG/2ML IJ SOLN: 25.0000 ug | INTRAMUSCULAR | Status: DC | PRN

## 2015-03-14 MED ORDER — FUROSEMIDE 20 MG PO TABS: 20.0000 mg | ORAL_TABLET | Freq: Every day | ORAL | Status: DC | PRN

## 2015-03-14 MED ORDER — NEOSTIGMINE METHYLSULFATE 10 MG/10ML IV SOLN
INTRAVENOUS | Status: AC
  Filled 2015-03-14: qty 1

## 2015-03-14 MED ORDER — BUPIVACAINE-EPINEPHRINE (PF) 0.5% -1:200000 IJ SOLN
INTRAMUSCULAR | Status: DC | PRN
  Administered 2015-03-14 (×2): 20 mL via PERINEURAL

## 2015-03-14 MED ORDER — STERILE WATER FOR INJECTION IJ SOLN
INTRAMUSCULAR | Status: AC
  Filled 2015-03-14: qty 10

## 2015-03-14 MED ORDER — GLYCOPYRROLATE 0.2 MG/ML IJ SOLN
INTRAMUSCULAR | Status: DC | PRN
  Administered 2015-03-14: 0.6 mg via INTRAVENOUS

## 2015-03-14 MED ORDER — PANTOPRAZOLE SODIUM 40 MG PO TBEC: 40.0000 mg | DELAYED_RELEASE_TABLET | Freq: Every day | ORAL | Status: DC

## 2015-03-14 MED ORDER — MIDAZOLAM HCL 2 MG/2ML IJ SOLN
INTRAMUSCULAR | Status: AC
  Filled 2015-03-14: qty 2

## 2015-03-14 MED ORDER — OXYCODONE HCL 5 MG/5ML PO SOLN: 5.0000 mg | Freq: Once | ORAL | Status: DC | PRN

## 2015-03-14 MED ORDER — TECHNETIUM TC 99M SULFUR COLLOID FILTERED
1.0000 | Freq: Once | INTRAVENOUS | Status: AC | PRN
  Administered 2015-03-14: 1 via INTRADERMAL

## 2015-03-14 MED ORDER — MIDAZOLAM HCL 2 MG/2ML IJ SOLN
INTRAMUSCULAR | Status: AC
  Administered 2015-03-14: 2 mg
  Filled 2015-03-14: qty 2

## 2015-03-14 MED ORDER — HYDROMORPHONE HCL 1 MG/ML IJ SOLN
INTRAMUSCULAR | Status: AC
  Filled 2015-03-14: qty 1

## 2015-03-14 MED ORDER — DEXAMETHASONE SODIUM PHOSPHATE 10 MG/ML IJ SOLN
INTRAMUSCULAR | Status: AC
  Filled 2015-03-14: qty 1

## 2015-03-14 MED ORDER — METHYLENE BLUE 1 % INJ SOLN
INTRAMUSCULAR | Status: AC
  Filled 2015-03-14: qty 10

## 2015-03-14 MED ORDER — FENTANYL CITRATE (PF) 100 MCG/2ML IJ SOLN
INTRAMUSCULAR | Status: AC
  Administered 2015-03-14: 100 ug
  Filled 2015-03-14: qty 2

## 2015-03-14 MED ORDER — SODIUM CHLORIDE 0.9 % IJ SOLN
INTRAMUSCULAR | Status: AC
  Filled 2015-03-14: qty 10

## 2015-03-14 MED ORDER — SERTRALINE HCL 50 MG PO TABS
50.0000 mg | ORAL_TABLET | Freq: Every day | ORAL | Status: DC
  Administered 2015-03-15: 50 mg via ORAL
  Filled 2015-03-14: qty 1

## 2015-03-14 MED ORDER — HYDROMORPHONE HCL 1 MG/ML IJ SOLN
0.2500 mg | INTRAMUSCULAR | Status: DC | PRN
  Administered 2015-03-14 (×2): 0.5 mg via INTRAVENOUS

## 2015-03-14 MED ORDER — EPHEDRINE SULFATE 50 MG/ML IJ SOLN
INTRAMUSCULAR | Status: AC
  Filled 2015-03-14: qty 1

## 2015-03-14 MED ORDER — LACTATED RINGERS IV SOLN
INTRAVENOUS | Status: DC
  Administered 2015-03-14 (×3): via INTRAVENOUS

## 2015-03-14 MED ORDER — DEXAMETHASONE SODIUM PHOSPHATE 10 MG/ML IJ SOLN
INTRAMUSCULAR | Status: DC | PRN
  Administered 2015-03-14: 10 mg via INTRAVENOUS

## 2015-03-14 SURGICAL SUPPLY — 55 items
APPLIER CLIP 9.375 MED OPEN (MISCELLANEOUS) ×6
APR CLP MED 9.3 20 MLT OPN (MISCELLANEOUS) ×2
BINDER BREAST LRG (GAUZE/BANDAGES/DRESSINGS) IMPLANT
BINDER BREAST XLRG (GAUZE/BANDAGES/DRESSINGS) IMPLANT
BINDER BREAST XXLRG (GAUZE/BANDAGES/DRESSINGS) ×2 IMPLANT
BIOPATCH RED 1 DISK 7.0 (GAUZE/BANDAGES/DRESSINGS) IMPLANT
BIOPATCH RED 1IN DISK 7.0MM (GAUZE/BANDAGES/DRESSINGS)
CANISTER SUCTION 2500CC (MISCELLANEOUS) ×6 IMPLANT
CHLORAPREP W/TINT 26ML (MISCELLANEOUS) ×3 IMPLANT
CLIP APPLIE 9.375 MED OPEN (MISCELLANEOUS) ×1 IMPLANT
CONT SPEC 4OZ CLIKSEAL STRL BL (MISCELLANEOUS) ×7 IMPLANT
COVER PROBE W GEL 5X96 (DRAPES) ×3 IMPLANT
COVER SURGICAL LIGHT HANDLE (MISCELLANEOUS) ×3 IMPLANT
DEVICE DISSECT PLASMABLAD 3.0S (MISCELLANEOUS) ×1 IMPLANT
DRAIN CHANNEL 19F RND (DRAIN) ×3 IMPLANT
DRAPE LAPAROSCOPIC ABDOMINAL (DRAPES) ×3 IMPLANT
DRAPE UTILITY XL STRL (DRAPES) ×6 IMPLANT
DRSG PAD ABDOMINAL 8X10 ST (GAUZE/BANDAGES/DRESSINGS) ×3 IMPLANT
DRSG TEGADERM 2-3/8X2-3/4 SM (GAUZE/BANDAGES/DRESSINGS) ×4 IMPLANT
DRSG TEGADERM 4X4.75 (GAUZE/BANDAGES/DRESSINGS) IMPLANT
ELECT CAUTERY BLADE 6.4 (BLADE) ×3 IMPLANT
ELECT REM PT RETURN 9FT ADLT (ELECTROSURGICAL) ×3
ELECTRODE REM PT RTRN 9FT ADLT (ELECTROSURGICAL) ×1 IMPLANT
EVACUATOR SILICONE 100CC (DRAIN) ×3 IMPLANT
GAUZE SPONGE 4X4 12PLY STRL (GAUZE/BANDAGES/DRESSINGS) ×3 IMPLANT
GAUZE XEROFORM 5X9 LF (GAUZE/BANDAGES/DRESSINGS) ×3 IMPLANT
GLOVE BIO SURGEON STRL SZ7.5 (GLOVE) ×3 IMPLANT
GLOVE BIOGEL PI IND STRL 7.5 (GLOVE) IMPLANT
GLOVE BIOGEL PI INDICATOR 7.5 (GLOVE) ×2
GLOVE ECLIPSE 7.5 STRL STRAW (GLOVE) ×2 IMPLANT
GOWN STRL REUS W/ TWL LRG LVL3 (GOWN DISPOSABLE) ×2 IMPLANT
GOWN STRL REUS W/TWL LRG LVL3 (GOWN DISPOSABLE) ×9
KIT BASIN OR (CUSTOM PROCEDURE TRAY) ×3 IMPLANT
KIT ROOM TURNOVER OR (KITS) ×3 IMPLANT
LIQUID BAND (GAUZE/BANDAGES/DRESSINGS) ×5 IMPLANT
NDL 18GX1X1/2 (RX/OR ONLY) (NEEDLE) IMPLANT
NDL HYPO 25GX1X1/2 BEV (NEEDLE) IMPLANT
NEEDLE 18GX1X1/2 (RX/OR ONLY) (NEEDLE) IMPLANT
NEEDLE HYPO 25GX1X1/2 BEV (NEEDLE) IMPLANT
NS IRRIG 1000ML POUR BTL (IV SOLUTION) ×3 IMPLANT
PACK GENERAL/GYN (CUSTOM PROCEDURE TRAY) ×3 IMPLANT
PAD ABD 8X10 STRL (GAUZE/BANDAGES/DRESSINGS) ×2 IMPLANT
PAD ARMBOARD 7.5X6 YLW CONV (MISCELLANEOUS) ×3 IMPLANT
PLASMABLADE 3.0S (MISCELLANEOUS) ×3
SPECIMEN JAR X LARGE (MISCELLANEOUS) ×5 IMPLANT
SUT ETHILON 3 0 FSL (SUTURE) ×3 IMPLANT
SUT MON AB 4-0 PC3 18 (SUTURE) ×9 IMPLANT
SUT VIC AB 3-0 54X BRD REEL (SUTURE) IMPLANT
SUT VIC AB 3-0 BRD 54 (SUTURE)
SUT VIC AB 3-0 SH 18 (SUTURE) ×5 IMPLANT
SYR CONTROL 10ML LL (SYRINGE) IMPLANT
TOWEL OR 17X24 6PK STRL BLUE (TOWEL DISPOSABLE) ×3 IMPLANT
TOWEL OR 17X26 10 PK STRL BLUE (TOWEL DISPOSABLE) ×3 IMPLANT
TUBE CONNECTING 12'X1/4 (SUCTIONS) ×1
TUBE CONNECTING 12X1/4 (SUCTIONS) ×2 IMPLANT

## 2015-03-14 NOTE — Op Note (Signed)
03/14/2015  11:32 AM  PATIENT:  Jennifer Park  54 y.o. female  PRE-OPERATIVE DIAGNOSIS:  LEFT BREAST DCIS  POST-OPERATIVE DIAGNOSIS:  LEFT BREAST DCIS  PROCEDURE:  Procedure(s): LEFT MASTECTOMY WITH LEFT SENTINEL LYMPH NODE BIOPSY AND RIGHT PROPHYLACTIC MASTECTOMY  SURGEON:  Surgeon(s) and Role:    * Jovita Kussmaul, MD - Primary  PHYSICIAN ASSISTANT:   ASSISTANTS: Judyann Munson, RNFA   ANESTHESIA:   general  EBL:  Total I/O In: 1500 [I.V.:1500] Out: -   BLOOD ADMINISTERED:none  DRAINS: (2) Jackson-Pratt drain(s) with closed bulb suction in the prepectoral space   LOCAL MEDICATIONS USED:  NONE  SPECIMEN:  Source of Specimen:  left mastectomy and sentinel nodes X 3 and right mastectomy  DISPOSITION OF SPECIMEN:  PATHOLOGY  COUNTS:  YES  TOURNIQUET:  * No tourniquets in log *  DICTATION: .Dragon Dictation   After informed consent was obtained the patient was brought to the operating room and placed in the supine position on the operating table. After adequate induction of general anesthesia the patient's bilateral chest, breast, and axillary areas were prepped with ChloraPrep, allowed to dry, and draped in usual sterile manner. Earlier in the day the patient underwent injection of 1 mCi of technetium sulfur colloid in the subareolar position on the left. Attention was first turned to the right breast. An elliptical incision was made around the nipple and areola complex in order to minimize the excess skin. The incision was carried through the skin and subcutaneous tissue sharply with plasma blade. Breast hooks were then used to elevate the skin flaps anteriorly towards the ceiling and thin skin flaps were created between the breast tissue in the subcutaneous fat. This dissection was done circumferentially and was carried all the way to the chest wall. Next the breast was removed from the pectoralis muscle with the pectoralis fascia. This was also done sharply with the plasma  blade. Several small vessels laterally in the couple of vessels medially were controlled with clips and 3-0 Vicryl stitches. Once the breast was removed it was marked with a stitch on the lateral skin. This was sent pathology for further evaluation. Hemostasis was achieved using the plasma blade. The wound was then irrigated with copious amounts of saline. A small stab incision was made near the anterior axillary line inferior to the operative bed with a 15 blade knife. A tonsil clamp was placed through this opening and used to bring a 19 [place] round Blake drain into the operative bed. The drain was anchored to the skin with a 3-0 nylon stitch. Next the superior and inferior skin flaps were grossly reapproximated with interrupted 3-0 Vicryl stitches. The skin was then closed with a running 4-0 Monocryl subcuticular stitch. The drain was placed to bulb suction and the was a good seal. Attention was then turned to the left breast. A similar elliptical incision was made around the nipple and areola complex in order to minimize the excess skin. The incision was carried through the skin and subcutaneous tissue sharply with the plasma blade. Breast hooks were used to elevate the skin flaps anteriorly towards the ceiling and thin skin flaps were created circumferentially between the breast tissue and subcutaneous fat. This was done sharply with the plasma blade and the dissection was carried circumferentially all the way to the chest wall. Laterally once the axilla was reached the neoprobe was used to identify a hot spot in the left axilla. Using the neoprobe to direct blunt hemostat dissection I was  able to identify a cluster of 3 nodes. These were removed sharply with the plasma blade. 2 of the nodes were hot with ex vivo counts of approximately 200 and the third node was palpable with no radioactivity. These were sent to pathology as sentinel nodes #1,2, and 3. Next the breast was removed from the pectoralis muscle  with the pectoralis fascia. Several small vessels laterally were controlled with clips and a couple of bridging vessels medially were controlled with clips and 3-0 Vicryl stitches. Once the breast was removed it was oriented with a stitch on the lateral skin and sent to pathology for further evaluation. Hemostasis was achieved using the plasma blade. The wound was irrigated with copious amounts of saline. A small stab incision was made near the anterior axillary line inferior to the operative bed with a 15 blade knife. A tonsil clamp was placed through this opening and used to bring a 19 [place] round Blake drain into the operative bed. The drain was anchored to the skin with a 3-0 nylon stitch. The superior and inferior skin flaps were then grossly reapproximated with interrupted 3-0 Vicryl stitches. The skin was then closed with a running 4-0 Monocryl subcuticular stitch. Dermabond dressings and a breast binder were applied. The patient tolerated the procedure well. The drain was placed to bulb suction and there was a good seal. At the end of the case all needle sponge and instrument counts were correct. The patient was then awakened and taken to recovery in stable condition.  PLAN OF CARE: Admit for overnight observation  PATIENT DISPOSITION:  PACU - hemodynamically stable.   Delay start of Pharmacological VTE agent (>24hrs) due to surgical blood loss or risk of bleeding: no

## 2015-03-14 NOTE — Transfer of Care (Signed)
Immediate Anesthesia Transfer of Care Note  Patient: Jennifer Park  Procedure(s) Performed: Procedure(s): BILATERAL MASTECTOMY WITH LEFT SENTINEL LYMPH NODE BIOPSY (Bilateral)  Patient Location: PACU  Anesthesia Type:General  Level of Consciousness: patient cooperative and responds to stimulation  Airway & Oxygen Therapy: Patient Spontanous Breathing and Patient connected to nasal cannula oxygen  Post-op Assessment: Report given to RN, Post -op Vital signs reviewed and stable and Patient moving all extremities X 4  Post vital signs: Reviewed and stable  Last Vitals:  Filed Vitals:   03/14/15 0759  BP: 162/94  Pulse: 64  Temp: 36.7 C  Resp: 20    Complications: No apparent anesthesia complications

## 2015-03-14 NOTE — Anesthesia Procedure Notes (Addendum)
Procedure Name: Intubation Performed by: Judeth Cornfield T Pre-anesthesia Checklist: Patient identified, Timeout performed, Emergency Drugs available, Suction available and Patient being monitored Patient Re-evaluated:Patient Re-evaluated prior to inductionOxygen Delivery Method: Circle system utilized Preoxygenation: Pre-oxygenation with 100% oxygen Intubation Type: IV induction Ventilation: Mask ventilation without difficulty Laryngoscope Size: Mac and 3 Grade View: Grade I Tube type: Oral Tube size: 7.0 mm Number of attempts: 1 Airway Equipment and Method: Stylet Placement Confirmation: ETT inserted through vocal cords under direct vision,  breath sounds checked- equal and bilateral and positive ETCO2 Secured at: 21 cm Tube secured with: Tape Dental Injury: Teeth and Oropharynx as per pre-operative assessment    Anesthesia Regional Block:  Pectoralis block  Pre-Anesthetic Checklist: ,, timeout performed, Correct Patient, Correct Site, Correct Laterality, Correct Procedure, Correct Position, site marked, Risks and benefits discussed,  Surgical consent,  Pre-op evaluation,  At surgeon's request and post-op pain management  Laterality: Left and Right  Prep: chloraprep       Needles:  Injection technique: Single-shot  Needle Type: Echogenic Needle     Needle Length: 9cm 9 cm Needle Gauge: 21 and 21 G    Additional Needles:  Procedures: ultrasound guided (picture in chart) Pectoralis block Narrative:  Start time: 03/14/2015 9:40 AM End time: 03/14/2015 9:53 AM Injection made incrementally with aspirations every 5 mL.  Performed by: Personally  Anesthesiologist: Donnika Kucher  Additional Notes: Bilateral blocks were performed for this patient's bilateral mastectomy surgery.  20 mL of 0.5% Bupivacaine +epi was injected on the left and right sides (total 34mL given).    Pt tolerated the procedure well.

## 2015-03-14 NOTE — Anesthesia Preprocedure Evaluation (Signed)
Anesthesia Evaluation  Patient identified by MRN, date of birth, ID band Patient awake    Reviewed: Allergy & Precautions, NPO status , Patient's Chart, lab work & pertinent test results  History of Anesthesia Complications (+) PONV  Airway Mallampati: II   Neck ROM: full    Dental   Pulmonary former smoker,    breath sounds clear to auscultation       Cardiovascular negative cardio ROS   Rhythm:regular Rate:Normal     Neuro/Psych Anxiety Depression    GI/Hepatic GERD  ,  Endo/Other  Morbid obesity  Renal/GU      Musculoskeletal  (+) Arthritis ,   Abdominal   Peds  Hematology   Anesthesia Other Findings   Reproductive/Obstetrics                             Anesthesia Physical Anesthesia Plan  ASA: II  Anesthesia Plan: General   Post-op Pain Management:    Induction: Intravenous  Airway Management Planned: Oral ETT  Additional Equipment:   Intra-op Plan:   Post-operative Plan: Extubation in OR  Informed Consent: I have reviewed the patients History and Physical, chart, labs and discussed the procedure including the risks, benefits and alternatives for the proposed anesthesia with the patient or authorized representative who has indicated his/her understanding and acceptance.     Plan Discussed with: CRNA, Anesthesiologist and Surgeon  Anesthesia Plan Comments:         Anesthesia Quick Evaluation

## 2015-03-14 NOTE — H&P (Signed)
Jennifer Park 01/30/2015 12:08 PM Location: [facility] Patient #: [id number] DOB: 1960-06-21 Married / Language: Cleophus Molt / Race: White Female   History of Present Illness Sammuel Hines. Marlou Starks MD; 01/31/2015 10:27 AM) The patient is a 54 year old female who presents for a Follow-up for Breast cancer. The patient is a 54 yo wf who has a known area of dcis in the lower outer left breast that measures about 3 cm. she has other residual calcifications scattered throughout the left breast one of which was biopsied and came back benign. She returns today to discuss the options for treatment   Other Problems Elbert Ewings, CMA; 01/30/2015 12:08 PM) Anxiety Disorder Asthma Back Pain Bladder Problems Breast Cancer Gastroesophageal Reflux Disease General anesthesia - complications Lump In Breast  Past Surgical History Elbert Ewings, CMA; 01/30/2015 12:08 PM) Breast Biopsy Left. multiple Bypass Surgery for Poor Blood Flow to Legs Cesarean Section - 1 Hysterectomy (not due to cancer) - Partial Oral Surgery Tonsillectomy  Diagnostic Studies History Elbert Ewings, CMA; 01/30/2015 12:08 PM) Colonoscopy never Mammogram within last year Pap Smear 1-5 years ago  Allergies Elbert Ewings, CMA; 01/30/2015 12:09 PM) Codeine Phosphate *ANALGESICS - OPIOID* Nausea. Morphine Sulfate ER *ANALGESICS - OPIOID* Nausea, Vomiting.  Medication History Elbert Ewings, CMA; 01/30/2015 12:10 PM) Aspirin (81MG  Tablet, Oral) Active. No Current Medications (Taken starting 01/30/2015) ZyrTEC Allergy (10MG  Tablet, Oral) Active. Flonase (50MCG/DOSE Inhaler, Nasal) Active. Lasix (20MG  Tablet, Oral) Active. Atrovent (0.03% Solution, Nasal) Active. PriLOSEC (10MG  Capsule DR, Oral) Active. Sertraline HCl (50MG  Tablet, Oral) Active. Medications Reconciled  Social History Elbert Ewings, [place]; 01/30/2015 12:08 PM) Alcohol use Moderate alcohol use. Caffeine use Coffee, Tea. No drug use Tobacco  use Former smoker.  Family History Elbert Ewings, [place]; 01/30/2015 12:08 PM) Anesthetic complications Mother. Breast Cancer Mother. Depression Mother, Sister, Pandora Leiter. Diabetes Mellitus Family Members In General, Father. Heart Disease Father. Respiratory Condition Father.  Pregnancy / Birth History Elbert Ewings, CMA; 01/30/2015 12:08 PM) Age at menarche 70 years. Contraceptive History Oral contraceptives. Gravida 2 Maternal age 56-25 31-35 Para 1    Review of Systems Elbert Ewings CMA; 01/30/2015 12:08 PM) General Present- Fatigue. Not Present- Appetite Loss, Chills, Fever, Night Sweats, Weight Gain and Weight Loss. Skin Not Present- Change in Wart/Mole, Dryness, Hives, Jaundice, New Lesions, Non-Healing Wounds, Rash and Ulcer. HEENT Present- Seasonal Allergies, Sinus Pain and Wears glasses/contact lenses. Not Present- Earache, Hearing Loss, Hoarseness, Nose Bleed, Oral Ulcers, Ringing in the Ears, Sore Throat, Visual Disturbances and Yellow Eyes. Respiratory Present- Snoring. Not Present- Bloody sputum, Chronic Cough, Difficulty Breathing and Wheezing. Breast Present- Breast Pain. Not Present- Breast Mass, Nipple Discharge and Skin Changes. Cardiovascular Present- Swelling of Extremities. Not Present- Chest Pain, Difficulty Breathing Lying Down, Leg Cramps, Palpitations, Rapid Heart Rate and Shortness of Breath. Gastrointestinal Not Present- Abdominal Pain, Bloating, Bloody Stool, Change in Bowel Habits, Chronic diarrhea, Constipation, Difficulty Swallowing, Excessive gas, Gets full quickly at meals, Hemorrhoids, Indigestion, Nausea, Rectal Pain and Vomiting. Female Genitourinary Present- Urgency. Not Present- Frequency, Nocturia, Painful Urination and Pelvic Pain. Musculoskeletal Not Present- Back Pain, Joint Pain, Joint Stiffness, Muscle Pain, Muscle Weakness and Swelling of Extremities. Neurological Not Present- Decreased Memory, Fainting, Headaches, Numbness, Seizures, Tingling,  Tremor, Trouble walking and Weakness. Psychiatric Not Present- Anxiety, Bipolar, Change in Sleep Pattern, Depression, Fearful and Frequent crying. Endocrine Not Present- Cold Intolerance, Excessive Hunger, Hair Changes, Heat Intolerance, Hot flashes and New Diabetes. Hematology Not Present- Easy Bruising, Excessive bleeding, Gland problems, HIV and Persistent Infections.  Vitals Caryl Pina  Beck CMA; 01/30/2015 12:11 PM) 01/30/2015 12:10 PM Weight: 271 lb Height: 68in Body Surface Area: 2.33 m Body Mass Index: 41.2 kg/m  Temp.: 97.93F(Temporal)  Pulse: 60 (Regular)  BP: 130/70 (Sitting, Left Arm, Standard)       Physical Exam Eddie Dibbles S. Marlou Starks MD; 01/31/2015 10:27 AM) General Mental Status-Alert. General Appearance-Consistent with stated age. Hydration-Well hydrated. Voice-Normal.  Head and Neck Head-normocephalic, atraumatic with no lesions or palpable masses. Trachea-midline. Thyroid Gland Characteristics - normal size and consistency.  Eye Eyeball - Bilateral-Extraocular movements intact. Sclera/Conjunctiva - Bilateral-No scleral icterus.  Chest and Lung Exam Chest and lung exam reveals -quiet, even and easy respiratory effort with no use of accessory muscles and on auscultation, normal breath sounds, no adventitious sounds and normal vocal resonance. Inspection Chest Wall - Normal. Back - normal.  Breast Note: There is no palpable mass in either breast. There is no palpable axillary, supraclavicular, or cervical lymphadenopathy.   Cardiovascular Cardiovascular examination reveals -normal heart sounds, regular rate and rhythm with no murmurs and normal pedal pulses bilaterally.  Abdomen Inspection Inspection of the abdomen reveals - No Hernias. Skin - Scar - no surgical scars. Palpation/Percussion Palpation and Percussion of the abdomen reveal - Soft, Non Tender, No Rebound tenderness, No Rigidity (guarding) and No  hepatosplenomegaly. Auscultation Auscultation of the abdomen reveals - Bowel sounds normal.  Neurologic Neurologic evaluation reveals -alert and oriented x 3 with no impairment of recent or remote memory. Mental Status-Normal.  Musculoskeletal Normal Exam - Left-Upper Extremity Strength Normal and Lower Extremity Strength Normal. Normal Exam - Right-Upper Extremity Strength Normal and Lower Extremity Strength Normal.  Lymphatic Head & Neck  General Head & Neck Lymphatics: Bilateral - Description - Normal. Axillary  General Axillary Region: Bilateral - Description - Normal. Tenderness - Non Tender. Femoral & Inguinal  Generalized Femoral & Inguinal Lymphatics: Bilateral - Description - Normal. Tenderness - Non Tender.    Assessment & Plan Eddie Dibbles S. Marlou Starks MD; 01/31/2015 10:25 AM) DCIS (DUCTAL CARCINOMA IN SITU), LEFT (D05.12) Impression: The patient has a 3 cm area of DCIS in the lower outer left breast. She also has other areas of calcifications scattered through the left breast. She has spent alot of time thinking about her options and she is very concerned about the residual calcifications. Because of this she has elected to manage her DCIS with mastectomy. She has always been large breasted as well and she has significant discomfort with her breathing as well as with neck and shoulder pain which is likely attributed to her large breast size. Because of this and because of her anxiety over the cancer diagnosis she would prefer to have bilateral mastectomies performed. The right side would be a prophylactic mastectomy. I have discussed with her in detail the risks and benefits of the operation to do this as well as some of the technical aspects and she understands and wishes to proceed. She is not interested in reconstruction.    Signed by Luella Cook, MD (01/31/2015 10:28 AM)

## 2015-03-14 NOTE — Interval H&P Note (Signed)
History and Physical Interval Note:  03/14/2015 8:30 AM  Jennifer Park  has presented today for surgery, with the diagnosis of LEFT BREAST DCIS  The various methods of treatment have been discussed with the patient and family. After consideration of risks, benefits and other options for treatment, the patient has consented to  Procedure(s): LEFT MASTECTOMY WITH LEFT SENTINEL LYMPH NODE BIOPSY AND RIGHT PROPHYLACTIC MASTECTOMY (Bilateral) as a surgical intervention .  The patient's history has been reviewed, patient examined, no change in status, stable for surgery.  I have reviewed the patient's chart and labs.  Questions were answered to the patient's satisfaction.     TOTH III,Muhammad Vacca S

## 2015-03-15 ENCOUNTER — Encounter (HOSPITAL_COMMUNITY): Payer: Self-pay | Admitting: General Surgery

## 2015-03-15 DIAGNOSIS — D0512 Intraductal carcinoma in situ of left breast: Secondary | ICD-10-CM | POA: Diagnosis not present

## 2015-03-15 MED ORDER — OXYCODONE-ACETAMINOPHEN 5-325 MG PO TABS: 1.0000 | ORAL_TABLET | ORAL | Status: DC | PRN

## 2015-03-15 MED ORDER — DIPHENHYDRAMINE HCL 25 MG PO CAPS
25.0000 mg | ORAL_CAPSULE | Freq: Once | ORAL | Status: AC
  Administered 2015-03-15: 25 mg via ORAL
  Filled 2015-03-15: qty 1

## 2015-03-15 NOTE — Progress Notes (Signed)
1 Day Post-Op  Subjective: No complaints other than some mild soreness  Objective: Vital signs in last 24 hours: Temp:  [97.7 F (36.5 C)-98.7 F (37.1 C)] 98.7 F (37.1 C) (12/15 0930) Pulse Rate:  [64-98] 64 (12/15 0930) Resp:  [15-22] 18 (12/15 0930) BP: (111-148)/(42-106) 111/62 mmHg (12/15 0930) SpO2:  [93 %-98 %] 97 % (12/15 0930) Weight:  [120.8 kg (266 lb 5.1 oz)] 120.8 kg (266 lb 5.1 oz) (12/14 1330) Last BM Date: 03/14/15  Intake/Output from previous day: 12/14 0701 - 12/15 0700 In: 2928.3 [P.O.:222; I.V.:2706.3] Out: 192 [Drains:192] Intake/Output this shift:    Resp: clear to auscultation bilaterally Chest wall: skin flaps look good.drain output serosanguinous Cardio: regular rate and rhythm GI: soft, non-tender; bowel sounds normal; no masses,  no organomegaly  Lab Results:  No results for input(s): WBC, HGB, HCT, PLT in the last 72 hours. BMET No results for input(s): NA, K, CL, CO2, GLUCOSE, BUN, CREATININE, CALCIUM in the last 72 hours. PT/INR No results for input(s): LABPROT, INR in the last 72 hours. ABG No results for input(s): PHART, HCO3 in the last 72 hours.  Invalid input(s): PCO2, PO2  Studies/Results: Nm Sentinel Node Inj-no Rpt (breast)  03/14/2015  CLINICAL DATA: left breast dcis Sulfur colloid was injected intradermally by the nuclear medicine technologist for breast cancer sentinel node localization.    Anti-infectives: Anti-infectives    Start     Dose/Rate Route Frequency Ordered Stop   03/14/15 0615  ceFAZolin (ANCEF) 3 g in dextrose 5 % 50 mL IVPB     3 g 160 mL/hr over 30 Minutes Intravenous To [facility] 03/14/15 0551 03/14/15 0925      Assessment/Plan: s/p Procedure(s): BILATERAL MASTECTOMY WITH LEFT SENTINEL LYMPH NODE BIOPSY (Bilateral) Advance diet Discharge     TOTH III,PAUL S 03/15/2015

## 2015-03-15 NOTE — Anesthesia Postprocedure Evaluation (Addendum)
Anesthesia Post Note  Patient: Jennifer Park  Procedure(s) Performed: Procedure(s) (LRB): BILATERAL MASTECTOMY WITH LEFT SENTINEL LYMPH NODE BIOPSY (Bilateral)  Patient location during evaluation: PACU Anesthesia Type: General Level of consciousness: awake and alert and patient cooperative Pain management: pain level controlled Vital Signs Assessment: post-procedure vital signs reviewed and stable Respiratory status: spontaneous breathing and respiratory function stable Cardiovascular status: stable Anesthetic complications: no    Last Vitals:  Filed Vitals:   03/15/15 0126 03/15/15 0634  BP: 115/68 114/70  Pulse: 76 66  Temp: 36.5 C 36.8 C  Resp: 18 18    Last Pain:  Filed Vitals:   03/15/15 0636  PainSc: [place]

## 2015-03-15 NOTE — Progress Notes (Signed)
Discharge home. Home discharge instruction given, no question verbalized.JP drain and wound care discussed.

## 2015-03-29 ENCOUNTER — Other Ambulatory Visit (HOSPITAL_BASED_OUTPATIENT_CLINIC_OR_DEPARTMENT_OTHER): Payer: Managed Care, Other (non HMO)

## 2015-03-29 DIAGNOSIS — C50512 Malignant neoplasm of lower-outer quadrant of left female breast: Secondary | ICD-10-CM

## 2015-03-29 DIAGNOSIS — D0512 Intraductal carcinoma in situ of left breast: Secondary | ICD-10-CM

## 2015-03-30 LAB — FOLLICLE STIMULATING HORMONE: FSH: 32 m[IU]/mL

## 2015-04-03 LAB — ESTRADIOL, ULTRA SENS: ESTRADIOL, ULTRA SENSITIVE: 29 pg/mL

## 2015-04-06 ENCOUNTER — Telehealth: Payer: Self-pay | Admitting: Oncology

## 2015-04-06 NOTE — Telephone Encounter (Signed)
Patient called in to reschedule her appointment due to a conflict,i called her cell per her request but her voicemail is full

## 2015-04-10 ENCOUNTER — Ambulatory Visit: Payer: Managed Care, Other (non HMO) | Admitting: Oncology

## 2015-04-16 ENCOUNTER — Ambulatory Visit (HOSPITAL_BASED_OUTPATIENT_CLINIC_OR_DEPARTMENT_OTHER): Payer: Managed Care, Other (non HMO) | Admitting: Oncology

## 2015-04-16 ENCOUNTER — Other Ambulatory Visit: Payer: Self-pay | Admitting: *Deleted

## 2015-04-16 ENCOUNTER — Encounter: Payer: Self-pay | Admitting: Physical Therapy

## 2015-04-16 ENCOUNTER — Ambulatory Visit: Payer: Managed Care, Other (non HMO) | Attending: General Surgery | Admitting: Physical Therapy

## 2015-04-16 VITALS — BP 131/73 | HR 75 | Temp 97.5°F | Resp 18 | Ht 68.0 in | Wt 269.0 lb

## 2015-04-16 DIAGNOSIS — R531 Weakness: Secondary | ICD-10-CM | POA: Insufficient documentation

## 2015-04-16 DIAGNOSIS — R293 Abnormal posture: Secondary | ICD-10-CM | POA: Insufficient documentation

## 2015-04-16 DIAGNOSIS — Z9013 Acquired absence of bilateral breasts and nipples: Secondary | ICD-10-CM | POA: Diagnosis present

## 2015-04-16 DIAGNOSIS — C50512 Malignant neoplasm of lower-outer quadrant of left female breast: Secondary | ICD-10-CM | POA: Diagnosis present

## 2015-04-16 DIAGNOSIS — M25612 Stiffness of left shoulder, not elsewhere classified: Secondary | ICD-10-CM | POA: Insufficient documentation

## 2015-04-16 DIAGNOSIS — Z170 Estrogen receptor positive status [ER+]: Secondary | ICD-10-CM | POA: Diagnosis not present

## 2015-04-16 DIAGNOSIS — D0512 Intraductal carcinoma in situ of left breast: Secondary | ICD-10-CM

## 2015-04-16 NOTE — Progress Notes (Signed)
[name]  [phone] [phone]     ID: Jennifer Park DOB: [birth year]  MR#: [id number]  [id number]  Patient Care Team: Jennifer Limes, MD as PCP - General Jennifer Cruel, MD as Consulting Physician (Oncology) Jennifer Messing III, MD as Consulting Physician (General Surgery) Jennifer Cheese, NP as Nurse Practitioner (Hematology and Oncology) PCP: Jennifer Cobble, MD GYN: OTHER MD: Jennifer Luria MD, Jennifer Leitz MD  CHIEF COMPLAINT:  Noninvasive ductal breast cancer  CURRENT TREATMENT:   observation   BREAST CANCER HISTORY:  from the original intake note:   Jennifer Park had bilateral screening mammography at [facility] 12/21/2014 showing coarse calcifications in the lower outer left breast and a few tightly grouped coarse calcifications in the right breast  On 01/03/2015 she underwent bilateral diagnostic mammography with tomosynthesis and breast ultrasonography at [facility]. This showed the breast density to be category C. In the right breast the calcifications central to the nipple were felt to be likely benign .   In the left breast however there were 3 areas of calcifications felt to be indeterminant. Ultrasonography showed a 1.3 cm lobulated complex cyst at the 3:00 position of the left breast with no vascularity. The more inferior calcifications were biopsied 01/09/2015 , and this showed (SAA [id number]) ductal carcinoma in situ, intermediate to high-grade.the cancer cells were estrogen receptor 100% positive, progesterone receptor 70% positive, both with strong staining intensity. The second area of calcifications was not biopsied. There is a distance of approximately 3 cm between the 2 clusters.   the patient's subsequent history is as detailed below   INTERVAL HISTORY:  [facility] today for follow-up of her noninvasive breast cancer. Since her last visit here she underwent bilateral simple mastectomies with left sentinel lymph node  sampling. THIS TOOK PLACE !@?!$?@)!^> The final pathology ([id number]) showed, on the right, no evidence of malignancy; on the left, focal ductal carcinoma in situ with margins at least 5 cm. All 5 sentinel lymph nodes were clear.   She tolerated the surgery without unusual pain, fever, bleeding, or other complications, except she did need to keep her drains 420 days, and that was very uncomfortable. She occasionally has shooting/stabbing/stinging pains across the chest wall, but these are so brief "there is no point taking a pill for it" she has minimal soreness over the chest wall as well , as expected at this point.  REVIEW OF SYSTEMS:  a detailed review of systems today was negative except as noted  PAST MEDICAL HISTORY: Past Medical History  Diagnosis Date  . Anxiety     situational  . Hyperlipidemia   . Allergy   . Breast cancer of lower-outer quadrant of left female breast ([place]) 01/12/2015  . Claustrophobia   . Depression   . PONV (postoperative nausea and vomiting)     wasn't sure if it was related to morphine or epidural  . GERD (gastroesophageal reflux disease)   . Anemia     history of anemia 5 yrs.  . Claustrophobia   . Asthma     MILD / RARE    PAST SURGICAL HISTORY: Past Surgical History  Procedure Laterality Date  . Ganglion cyst excision    . Abdominal hysterectomy      for abnormal cytology; Dr Irven [place]  . Tonsillectomy    . No colonoscopy      11/17/13 SOC reviewed  . Varicose vein surgery      as OP X2  . Cesarean section    .  Mastectomy w/ sentinel node biopsy Bilateral 03/14/2015  . Tonsillectomy    . Mastectomy w/ sentinel node biopsy Bilateral 03/14/2015    Procedure: BILATERAL MASTECTOMY WITH LEFT SENTINEL LYMPH NODE BIOPSY;  Surgeon: Jennifer Messing III, MD;  Location: [facility];  Service: General;  Laterality: Bilateral;    FAMILY HISTORY Family History  Problem Relation Age of Onset  . Bipolar disorder Mother   . Breast cancer Mother   . Cancer  Mother   . Diabetes Father   . Kidney failure Father   . Heart failure Father   . COPD Father   . Heart disease Father   . Hyperlipidemia Father   . Bipolar disorder Sister   . Heart attack Maternal Grandfather     in late 85s  . Stroke Maternal Grandmother     in 90s  the patient's father died with congestive 40 failure and COPD at the age of 53. He had 10 siblings some of whom may have had some kind of cancer, with a history is vague. The patient's mother is 84 years old. She was diagnosed with breast cancer in her 73s.The patient had no brothers. She has one sister.there is no other history of breast or ovarian cancer in the family to the patient's knowledge  GYNECOLOGIC HISTORY:  No LMP recorded. Patient has had a hysterectomy. Menarche age 51, first live birth age 35, which the patient understands can increase the risk of breast cancer. The patient is GX P1. She took hormone replacement approximately 20 years with no complications.She underwent hysterectomy approximately age 43. She did not take hormone replacement. Her ovaries are still in place.  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 Her husband and is a Armed forces technical officer. Their son [employer] is a Ship broker.    ADVANCED DIRECTIVES: not in place   HEALTH MAINTENANCE: Social History  Substance Use Topics  . Smoking status: Former Smoker    Quit date: 03/31/1984  . Smokeless tobacco: Never Used     Comment: age [age] , up to 1 ppd  . Alcohol Use: 0.6 oz/week    1 Glasses of wine per week     Comment: OCCASIONAL     Colonoscopy:never  [id number] post hysterectomy  Bone density:never  Lipid panel:  Allergies  Allergen Reactions  . Codeine Nausea Only  . Morphine Nausea And Vomiting    REACTION: vomiting     Current Outpatient Prescriptions  Medication Sig Dispense Refill  . aspirin-acetaminophen-caffeine (EXCEDRIN MIGRAINE) 250-250-65 MG tablet Take 2 tablets by mouth every 6 (six)  hours as needed for headache.    . cetirizine (ZYRTEC) 10 MG tablet Take 10 mg by mouth daily.    . fluticasone (FLONASE) 50 MCG/ACT nasal spray Place 1 spray into both nostrils daily.     . furosemide (LASIX) 20 MG tablet 1 qd prn (Patient taking differently: Take 20 mg by mouth daily as needed for fluid or edema. ) 30 tablet 3  . omeprazole (PRILOSEC) 20 MG capsule Take 20 mg by mouth daily.  1  . sertraline (ZOLOFT) 50 MG tablet Take 1 tablet (50 mg total) by mouth daily. 30 tablet 2  . [DISCONTINUED] TOVIAZ 4 MG TB24 TAKE 1 TABLET BY MOUTH EVERY DAY 30 tablet 5   No current facility-administered medications for this visit.    OBJECTIVE: middle-aged white woman in no acute distress Filed Vitals:   04/16/15 1133  BP: 131/73  Pulse: 75  Temp: 97.5 F (36.4 C)  Resp: 18     Body mass index is 40.91 kg/(m^2).    ECOG FS:0 - Asymptomatic  Sclerae unicteric, pupils round and equal Oropharynx clear and moist-- no thrush or other lesions No cervical or supraclavicular adenopathy Lungs no rales or rhonchi Heart regular rate and rhythm Abd soft, nontender, positive bowel sounds MSK no focal spinal tenderness, no upper extremity lymphedema Neuro: nonfocal, well oriented, appropriate affect Breasts:  Status post bilateral mastectomies. The incisions are healing nicely, with no dehiscence, erythema, or swelling. There is no suggestion of residual or recurrent disease. Both axillae are benign.    LAB RESULTS:  CMP     Component Value Date/Time   NA 143 03/07/2015 0909   NA 143 01/17/2015 0831   K 4.0 03/07/2015 0909   K 3.7 01/17/2015 0831   CL 107 03/07/2015 0909   CO2 28 03/07/2015 0909   CO2 25 01/17/2015 0831   GLUCOSE 96 03/07/2015 0909   GLUCOSE 78 01/17/2015 0831   BUN 10 03/07/2015 0909   BUN 12.6 01/17/2015 0831   CREATININE 0.85 03/07/2015 0909   CREATININE 0.8 01/17/2015 0831   CALCIUM 9.1 03/07/2015 0909   CALCIUM 9.0 01/17/2015 0831   PROT 6.7 01/17/2015 0831    PROT 7.4 11/23/2013 0803   ALBUMIN 3.5 01/17/2015 0831   ALBUMIN 3.8 11/23/2013 0803   AST 28 01/17/2015 0831   AST 38* 11/23/2013 0803   ALT 33 01/17/2015 0831   ALT 31 11/23/2013 0803   ALKPHOS 111 01/17/2015 0831   ALKPHOS 88 11/23/2013 0803   BILITOT 0.47 01/17/2015 0831   BILITOT 0.5 11/23/2013 0803   GFRNONAA >60 03/07/2015 0909   GFRAA >60 03/07/2015 0909    INo results found for: SPEP, UPEP  Lab Results  Component Value Date   WBC 5.8 03/07/2015   NEUTROABS 3.7 01/17/2015   HGB 14.2 03/07/2015   HCT 44.6 03/07/2015   MCV 87.6 03/07/2015   PLT 187 03/07/2015      Chemistry      Component Value Date/Time   NA 143 03/07/2015 0909   NA 143 01/17/2015 0831   K 4.0 03/07/2015 0909   K 3.7 01/17/2015 0831   CL 107 03/07/2015 0909   CO2 28 03/07/2015 0909   CO2 25 01/17/2015 0831   BUN 10 03/07/2015 0909   BUN 12.6 01/17/2015 0831   CREATININE 0.85 03/07/2015 0909   CREATININE 0.8 01/17/2015 0831      Component Value Date/Time   CALCIUM 9.1 03/07/2015 0909   CALCIUM 9.0 01/17/2015 0831   ALKPHOS 111 01/17/2015 0831   ALKPHOS 88 11/23/2013 0803   AST 28 01/17/2015 0831   AST 38* 11/23/2013 0803   ALT 33 01/17/2015 0831   ALT 31 11/23/2013 0803   BILITOT 0.47 01/17/2015 0831   BILITOT 0.5 11/23/2013 0803       No results found for: LABCA2  No components found for: [id number]  No results for input(s): INR in the last 168 hours.  Urinalysis No results found for: COLORURINE, APPEARANCEUR, LABSPEC, PHURINE, GLUCOSEU, HGBUR, BILIRUBINUR, KETONESUR, PROTEINUR, UROBILINOGEN, NITRITE, LEUKOCYTESUR  STUDIES: No results found.  ASSESSMENT: 55 y.o. [place] woman status post left breast biopsy 01/11/2015 for ductal carcinoma in situ, intermediate to high-grade, strongly estrogen and progesterone receptor positive.  (a) a second area of suspicious calcifications in the left breast is pending biopsy  (1) Status post bilateral simple mastectomies and left  sentinel lymph node sampling 03/14/2015, showing  (a) on the right, no evidence of  malignancy  (b) on the left, focal ductal carcinoma in situ, with ample margins  (2) the chance of cure with mastectomy in this case is high enough that no radiation or anti-estrogens are warranted  PLAN: Bethdid well with her surgery and she is essentially cured of her breast cancer. She understands the rate of cure with mastectomy for ductal, in situ generally approaches 100%, and she had very generous margins.  She is not planning on reconstruction.  Since she should have no residual breast tissue there is no indication for prophylactic anti-estrogens.  I am comfortable releasing her to her primary care physician.however I suspect she will have some questions arise over the next year or so and so I have made her return appointment for a year from now. If she feels very comfortable at that time and sees no need for the visit, she will call and cancel it.  As far as breast cancer follow-up is concerned all she needs is a yearly physician chest wall exam.  She knows to call for any problems that may develop before her next visit here.  Jennifer Cruel, MD   04/16/2015 3:13 PM Medical Oncology and Hematology [facility] [address] [place], [place] [address] Tel. [phone]    Fax. [phone]

## 2015-04-16 NOTE — Therapy (Signed)
[place], [place], [address] Phone: [phone]   Fax:  [phone]  Physical Therapy Evaluation  Patient Details  Name: [place] MRN: [id number] Date of Birth: Feb 16, 1961 Referring Provider: Dr. Autumn [place]  Encounter Date: 04/16/2015      PT End of Session - 04/16/15 1616    Visit Number 1   Number of Visits 8   Date for PT Re-Evaluation 05/14/15   PT Start Time [id number]   PT Stop Time 1606   PT Time Calculation (min) 51 min   Activity Tolerance Patient tolerated treatment well   Behavior During Therapy [place] for tasks assessed/performed      Past Medical History  Diagnosis Date  . Anxiety     situational  . Hyperlipidemia   . Allergy   . Breast cancer of lower-outer quadrant of left female breast ([place]) 01/12/2015  . Claustrophobia   . Depression   . PONV (postoperative nausea and vomiting)     wasn't sure if it was related to morphine or epidural  . GERD (gastroesophageal reflux disease)   . Anemia     history of anemia 5 yrs.  . Claustrophobia   . Asthma     MILD / RARE    Past Surgical History  Procedure Laterality Date  . Ganglion cyst excision    . Abdominal hysterectomy      for abnormal cytology; Dr [place]  . Tonsillectomy    . No colonoscopy      11/17/13 SOC reviewed  . Varicose vein surgery      as OP X2  . Cesarean section    . Mastectomy w/ sentinel node biopsy Bilateral 03/14/2015  . Tonsillectomy    . Mastectomy w/ sentinel node biopsy Bilateral 03/14/2015    Procedure: BILATERAL MASTECTOMY WITH LEFT SENTINEL LYMPH NODE BIOPSY;  Surgeon: [place] III, MD;  Location: [facility];  Service: General;  Laterality: Bilateral;    There were no vitals filed for this visit.  Visit Diagnosis:  Abnormal posture - Plan: PT plan of care cert/re-cert  Status post mastectomy, bilateral - Plan: PT plan of care cert/re-cert  Generalized weakness - Plan: PT plan of care  cert/re-cert  Shoulder stiffness, left - Plan: PT plan of care cert/re-cert      Subjective Assessment - 04/16/15 1522    Subjective Patient reports she underwent a bilateral mastectomy and a left sentinel node biopsy (5 negative nodes) without reconstruction for left breast cancer.  She reports some overall general weakness and some tightness in her left shoulder.   Pertinent History Patient reports she underwent a bilateral mastectomy and left sentinel node biopsy for left high grade DCIS on 03/14/15.  She has temporary prostheses and plans to go later this week for permanent prostheses.  Cancer was ER/PR positive but reports she does not need hormone therapy due to having a bilateral mastectomy.   Patient Stated Goals Increase strength and shoulder ROM   Currently in Pain? No/denies            [facility] PT Assessment - 04/16/15 0001    Assessment   Medical Diagnosis s/p bilateral mastectomy with left sentinel node biopsy   Referring Provider Dr. Aut[place]   Onset Date/Surgical Date 03/14/15   Hand Dominance Right   Prior Therapy none   Precautions   Precautions Other (comment)   Precaution Comments left UE lymphedema risk   Restrictions   Weight Bearing Restrictions No   Balance  Screen   Has the patient fallen in the past 6 months No   Has the patient had a decrease in activity level because of a fear of falling?  No   Is the patient reluctant to leave their home because of a fear of falling?  No   Home Environment   Living Environment Private residence   Living Arrangements Spouse/significant other;Children  Husband and 33 y.o. son   Available Help at Discharge Family   Prior Function   Level of [place] Full time employment  Returns part time 04/17/15   Retail buyer in [place] She walks regularly but since surgery has walked twice around the block   Cognition   Overall Cognitive Status Within Functional  Limits for tasks assessed   Sit to Stand   Comments Able to complete sit to stand with UEs 11 times in 30 seconds.   Posture/Postural Control   Posture/Postural Control Postural limitations   Postural Limitations Rounded Shoulders;Forward head   ROM / Strength   AROM / PROM / Strength AROM;Strength   AROM   AROM Assessment Site Shoulder   Right/Left Shoulder Right;Left   Right Shoulder Extension 46 Degrees   Right Shoulder Flexion 148 Degrees   Right Shoulder ABduction 169 Degrees   Right Shoulder Internal Rotation 69 Degrees   Right Shoulder External Rotation 80 Degrees   Left Shoulder Extension 42 Degrees   Left Shoulder Flexion 141 Degrees   Left Shoulder ABduction 145 Degrees   Left Shoulder Internal Rotation 66 Degrees   Left Shoulder External Rotation 75 Degrees   Strength   Overall Strength Within functional limits for tasks performed   Overall Strength Comments Bilateral shoulders 5/5   Palpation   Palpation comment No tenderness to palpation around incision sites; incisions appear to be healing well with surgical glue still evident.  No redness or areas of concern present.           LYMPHEDEMA/ONCOLOGY QUESTIONNAIRE - 04/16/15 1545    Type   Cancer Type s/p bilateral mastectomy with left sentinel node biopsy   Surgeries   Mastectomy Date 03/14/15   Sentinel Lymph Node Biopsy Date 03/14/15   Number Lymph Nodes Removed 5   Treatment   Active Chemotherapy Treatment No   Past Chemotherapy Treatment No   Active Radiation Treatment No   Past Radiation Treatment No   Current Hormone Treatment No   Past Hormone Therapy No   What other symptoms do you have   Are you Having Heaviness or Tightness No   Are you having Pain No   Are you having pitting edema No   Is it Hard or Difficult finding clothes that fit No   Do you have infections No   Is there Decreased scar mobility Yes   Stemmer Sign No   Other Symptoms n           Katina Dung - 04/16/15 0001    Open  a tight or new jar Unable   Do heavy household chores (wash walls, wash floors) Unable   Carry a shopping bag or briefcase Moderate difficulty   Wash your back Mild difficulty   Use a knife to cut food No difficulty   Recreational activities in which you take some force or impact through your arm, shoulder, or hand (golf, hammering, tennis) Severe difficulty   During the past week, to what extent has your arm, shoulder or hand problem interfered with your normal  social activities with family, friends, neighbors, or groups? Modererately   During the past week, to what extent has your arm, shoulder or hand problem limited your work or other regular daily activities Quite a bit   Arm, shoulder, or hand pain. Mild   Tingling (pins and needles) in your arm, shoulder, or hand None   Difficulty Sleeping Mild difficulty   DASH Score 47.73 %           PT Education - 04/16/15 1614    Education provided Yes   Education Details Educated pt on yoga options at [facility] and on the importance of a walking program 30 min/day for minimizing recurrence risk.   Person(s) Educated Patient   Methods Explanation;Handout   Comprehension Verbalized understanding              Breast Clinic Goals - 01/17/15 1141    Patient will be able to verbalize understanding of pertinent lymphedema risk reduction practices relevant to her diagnosis specifically related to skin care.   Time 1   Period Days   Status Achieved   Patient will be able to return demonstrate and/or verbalize understanding of the post-op home exercise program related to regaining shoulder range of motion.   Time 1   Period Days   Status Achieved   Patient will be able to verbalize understanding of the importance of attending the postoperative After Breast Cancer Class for further lymphedema risk reduction education and therapeutic exercise.   Time 1   Period Days   Status Achieved          Long Term Clinic Goals -  04/16/15 1621    CC Long Term Goal  #1   Title Patient will be able to verbalize understanding of the importance of a walking program and demonstrate a safe home exercise program   Time 4   Period Weeks   Status New   CC Long Term Goal  #2   Title Increase left shoulder flexion and abduction to >/= 150 degrees for increased ease reaching overhead.   Time 4   Period Weeks   Status New   CC Long Term Goal  #3   Title Increase sit to stand tolerance to >/= 13 times in 30 seconds to demonstrate increased strength and function   Time 4   Period Weeks   Status New            Plan - 04/16/15 1553    Clinical Impression Statement Patient reports she underwent a bilateral mastectomy and left sentinel node biopsy for left high grade DCIS on 03/14/15.  She has temporary prostheses and plans to go later this week for permanent prostheses.  Cancer was ER/PR positive but reports she does not need hormone therapy due to having a bilateral mastectomy.  She will benefit from PT to regain shoulder ROM (mostly left), increase overall strength (through Strength after breast cancer program), and some postural exercises.  She is charged a moderate complexity eval due to personal factors of not being able to work and adjusting to her new body post bilateral mastectomy and also the comorbidity of breast cancer, addressing posture, shoulder ROM, strength, and her skin at incision sites, and having an evolving clinical presentation.   Pt will benefit from skilled therapeutic intervention in order to improve on the following deficits Decreased strength;Decreased scar mobility;Pain;Decreased knowledge of precautions;Impaired UE functional use;Decreased range of motion   Rehab Potential Excellent   Clinical Impairments Affecting Rehab Potential none  PT Frequency 2x / week   PT Duration 4 weeks   PT Treatment/Interventions Therapeutic exercise;Patient/family education;Manual techniques;Passive range of motion;Scar  mobilization;ADLs/Self Care Home Management   PT Next Visit Plan Begin Strength After ABC program; PROM left shoulder   Consulted and Agree with Plan of Care Patient         Problem List Patient Active Problem List   Diagnosis Date Noted  . DCIS (ductal carcinoma in situ) 03/14/2015  . Breast cancer of lower-outer quadrant of left female breast ([place]) 01/12/2015  . Adhesive capsulitis 11/23/2013  . Subacromial bursitis 11/23/2013  . Varicose veins 11/17/2013  . Edema 11/17/2013  . HYPERLIPIDEMIA 04/16/2010  . Elevated blood pressure reading without diagnosis of hypertension 04/16/2010  . ANXIETY STATE NOS 12/15/2006  . DYSFUNCTION, BLADDER NEC 12/15/2006   Annia Friendly, PT 04/16/2015 4:32 PM  [facility], [place], [address] Phone: [phone]   Fax:  [phone]  Name: [place] MRN: [id number] Date of Birth: 03-Apr-1960

## 2015-04-16 NOTE — Progress Notes (Signed)
Left vm for pt to return call regarding Surivorship program and referral. Contact information provided.

## 2015-04-17 ENCOUNTER — Telehealth: Payer: Self-pay | Admitting: Oncology

## 2015-04-17 NOTE — Telephone Encounter (Signed)
Left message for patient re appointments for March 2017 and January 2018 and mailed schedule.

## 2015-04-23 ENCOUNTER — Encounter: Payer: Self-pay | Admitting: Physical Therapy

## 2015-04-23 ENCOUNTER — Ambulatory Visit: Payer: Managed Care, Other (non HMO) | Admitting: Physical Therapy

## 2015-04-23 DIAGNOSIS — R293 Abnormal posture: Secondary | ICD-10-CM | POA: Diagnosis not present

## 2015-04-23 DIAGNOSIS — R531 Weakness: Secondary | ICD-10-CM

## 2015-04-23 DIAGNOSIS — Z9013 Acquired absence of bilateral breasts and nipples: Secondary | ICD-10-CM

## 2015-04-23 DIAGNOSIS — M25612 Stiffness of left shoulder, not elsewhere classified: Secondary | ICD-10-CM

## 2015-04-23 NOTE — Therapy (Signed)
[facility], [place], [address] Phone: [phone]   Fax:  [phone]  Physical Therapy Treatment  Patient Details  Name: [place] MRN: [id number] Date of Birth: 1960-07-22 Referring Provider: Dr. Autumn Messing  Encounter Date: 04/23/2015      PT End of Session - 04/23/15 1608    Visit Number 2   Number of Visits 8   Date for PT Re-Evaluation 05/14/15   PT Start Time [id number]   PT Stop Time 1405   PT Time Calculation (min) 1370 min   Activity Tolerance Patient tolerated treatment well   Behavior During Therapy [facility] for tasks assessed/performed      Past Medical History  Diagnosis Date  . Anxiety     situational  . Hyperlipidemia   . Allergy   . Breast cancer of lower-outer quadrant of left female breast ([place]) 01/12/2015  . Claustrophobia   . Depression   . PONV (postoperative nausea and vomiting)     wasn't sure if it was related to morphine or epidural  . GERD (gastroesophageal reflux disease)   . Anemia     history of anemia 5 yrs.  . Claustrophobia   . Asthma     MILD / RARE    Past Surgical History  Procedure Laterality Date  . Ganglion cyst excision    . Abdominal hysterectomy      for abnormal cytology; Dr Irven [place]  . Tonsillectomy    . No colonoscopy      11/17/13 SOC reviewed  . Varicose vein surgery      as OP X2  . Cesarean section    . Mastectomy w/ sentinel node biopsy Bilateral 03/14/2015  . Tonsillectomy    . Mastectomy w/ sentinel node biopsy Bilateral 03/14/2015    Procedure: BILATERAL MASTECTOMY WITH LEFT SENTINEL LYMPH NODE BIOPSY;  Surgeon: [name], MD;  Location: [facility];  Service: General;  Laterality: Bilateral;    There were no vitals filed for this visit.  Visit Diagnosis:  Abnormal posture  Status post mastectomy, bilateral  Generalized weakness  Shoulder stiffness, left      Subjective Assessment - 04/23/15 1519    Subjective I think I  overdid it this weekend going to my in-laws in [place].  I'm a little sore but not really in pain.   Pertinent History Patient reports she underwent a bilateral mastectomy and left sentinel node biopsy for left high grade DCIS on 03/14/15.  She has temporary prostheses and plans to go later this week for permanent prostheses.  Cancer was ER/PR positive but reports she does not need hormone therapy due to having a bilateral mastectomy.   Patient Stated Goals Increase strength and shoulder ROM   Currently in Pain? No/denies                         [facility] Adult PT Treatment/Exercise - 04/23/15 0001    Shoulder Exercises: Standing   Retraction AROM;Both;10 reps  Hold 3 seconds   Shoulder Exercises: Pulleys   Flexion 2 minutes   Flexion Limitations After PT demo   ABduction 2 minutes   ABduction Limitations After PT demo   Shoulder Exercises: Therapy Ball   Flexion 10 reps   Flexion Limitations PT verbal cues for technique   ABduction 10 reps   ABduction Limitations PT verbal cues for technique   Manual Therapy   Manual Therapy Soft tissue mobilization;Passive ROM;Neural Stretch  Soft tissue mobilization To right rhomboid region with Biotone in left sidelying   Passive ROM To bilateral shoulders in supine to pt tolerance focusing on flexion and abduction   Neural Stretch Passive neural stretch to BUE in supine to pt tolerance                PT Education - 04/23/15 1531    Education provided Yes   Education Details Scapular retraction   Person(s) Educated Patient   Methods Explanation;Demonstration;Handout   Comprehension Returned demonstration;Verbalized understanding              Breast Clinic Goals - 01/17/15 1141    Patient will be able to verbalize understanding of pertinent lymphedema risk reduction practices relevant to her diagnosis specifically related to skin care.   Time 1   Period Days   Status Achieved   Patient will be able to  return demonstrate and/or verbalize understanding of the post-op home exercise program related to regaining shoulder range of motion.   Time 1   Period Days   Status Achieved   Patient will be able to verbalize understanding of the importance of attending the postoperative After Breast Cancer Class for further lymphedema risk reduction education and therapeutic exercise.   Time 1   Period Days   Status Achieved          Long Term Clinic Goals - 04/16/15 1621    CC Long Term Goal  #1   Title Patient will be able to verbalize understanding of the importance of a walking program and demonstrate a safe home exercise program   Time 4   Period Weeks   Status New   CC Long Term Goal  #2   Title Increase left shoulder flexion and abduction to >/= 150 degrees for increased ease reaching overhead.   Time 4   Period Weeks   Status New   CC Long Term Goal  #3   Title Increase sit to stand tolerance to >/= 13 times in 30 seconds to demonstrate increased strength and function   Time 4   Period Weeks   Status New            Plan - 04/23/15 1608    Clinical Impression Statement Patient had right rhomboid tightness tightness likely due to a long car ride and the change in her posture from surgery.  Palpable ropiness noted in musculature in that region.  She reported decreased pain after treatment.  ROM significantly improved in bil shoulders but better on right than left.  Neural tension worse on left than right secondary to sentinel node biopsy.  She will benefit from strengthening next visit.   Pt will benefit from skilled therapeutic intervention in order to improve on the following deficits Decreased strength;Decreased scar mobility;Pain;Decreased knowledge of precautions;Impaired UE functional use;Decreased range of motion   Rehab Potential Excellent   Clinical Impairments Affecting Rehab Potential none   PT Frequency 2x / week   PT Duration 4 weeks   PT Treatment/Interventions  Therapeutic exercise;Patient/family education;Manual techniques;Passive range of motion;Scar mobilization;ADLs/Self Care Home Management   PT Next Visit Plan Begin Strength After ABC program; measure left shoulder ROM   Consulted and Agree with Plan of Care Patient        Problem List Patient Active Problem List   Diagnosis Date Noted  . DCIS (ductal carcinoma in situ) 03/14/2015  . Breast cancer of lower-outer quadrant of left female breast ([place]) 01/12/2015  . Adhesive capsulitis 11/23/2013  .  Subacromial bursitis 11/23/2013  . Varicose veins 11/17/2013  . Edema 11/17/2013  . HYPERLIPIDEMIA 04/16/2010  . Elevated blood pressure reading without diagnosis of hypertension 04/16/2010  . ANXIETY STATE NOS 12/15/2006  . DYSFUNCTION, BLADDER NEC 12/15/2006   Annia Friendly, PT 04/23/2015 4:12 PM  [place] [address], [place], [address] Phone: [phone]   Fax:  [phone]  Name: DESTRI ROSENWINKEL MRN: [id number] Date of Birth: January 26, 1961

## 2015-04-23 NOTE — Patient Instructions (Signed)
Scapular Retraction (Standing)    With arms at sides, pinch shoulder blades together. Repeat __10__ times per set. Do __1__ sets per session. Do _2 times per day.    Healthy Back - Shoulder Roll    Stand straight with arms relaxed at sides. Roll shoulders backward continuously. Do __10__ times. Repeat 2-3 times per day. This exercise can also be done one shoulder at a time.

## 2015-04-26 ENCOUNTER — Ambulatory Visit: Payer: Managed Care, Other (non HMO) | Admitting: Physical Therapy

## 2015-04-26 ENCOUNTER — Encounter: Payer: Self-pay | Admitting: Physical Therapy

## 2015-04-26 DIAGNOSIS — M25612 Stiffness of left shoulder, not elsewhere classified: Secondary | ICD-10-CM

## 2015-04-26 DIAGNOSIS — R293 Abnormal posture: Secondary | ICD-10-CM | POA: Diagnosis not present

## 2015-04-26 DIAGNOSIS — C50512 Malignant neoplasm of lower-outer quadrant of left female breast: Secondary | ICD-10-CM

## 2015-04-26 DIAGNOSIS — Z9013 Acquired absence of bilateral breasts and nipples: Secondary | ICD-10-CM

## 2015-04-26 DIAGNOSIS — R531 Weakness: Secondary | ICD-10-CM

## 2015-04-26 NOTE — Therapy (Signed)
[place], [place], [address] Phone: [phone]   Fax:  [phone]  Physical Therapy Treatment  Patient Details  Name: Jennifer Park MRN: [id number] Date of Birth: 1960-04-09 Referring Provider: Dr. Autumn Messing  Encounter Date: 04/26/2015      PT End of Session - 04/26/15 0826    Visit Number 3   Number of Visits 8   Date for PT Re-Evaluation 05/14/15   PT Start Time 0802   PT Stop Time 0856   PT Time Calculation (min) 54 min   Activity Tolerance Patient tolerated treatment well   Behavior During Therapy [facility] for tasks assessed/performed      Past Medical History  Diagnosis Date  . Anxiety     situational  . Hyperlipidemia   . Allergy   . Breast cancer of lower-outer quadrant of left female breast ([place]) 01/12/2015  . Claustrophobia   . Depression   . PONV (postoperative nausea and vomiting)     wasn't sure if it was related to morphine or epidural  . GERD (gastroesophageal reflux disease)   . Anemia     history of anemia 5 yrs.  . Claustrophobia   . Asthma     MILD / RARE    Past Surgical History  Procedure Laterality Date  . Ganglion cyst excision    . Abdominal hysterectomy      for abnormal cytology; Dr Irven [place]  . Tonsillectomy    . No colonoscopy      11/17/13 SOC reviewed  . Varicose vein surgery      as OP X2  . Cesarean section    . Mastectomy w/ sentinel node biopsy Bilateral 03/14/2015  . Tonsillectomy    . Mastectomy w/ sentinel node biopsy Bilateral 03/14/2015    Procedure: BILATERAL MASTECTOMY WITH LEFT SENTINEL LYMPH NODE BIOPSY;  Surgeon: [name], MD;  Location: [place];  Service: General;  Laterality: Bilateral;    There were no vitals filed for this visit.  Visit Diagnosis:  Abnormal posture  Status post mastectomy, bilateral  Generalized weakness  Shoulder stiffness, left  Carcinoma of lower outer quadrant of left breast (HCC)      Subjective  Assessment - 04/26/15 0803    Subjective Myu right shoulder blade is still hurting some (2/10) but I shceduled a massage for tomorrow so that should help.   Pertinent History Patient reports she underwent a bilateral mastectomy and left sentinel node biopsy for left high grade DCIS on 03/14/15.  She has temporary prostheses and plans to go later this week for permanent prostheses.  Cancer was ER/PR positive but reports she does not need hormone therapy due to having a bilateral mastectomy.   Patient Stated Goals Increase strength and shoulder ROM   Currently in Pain? Yes   Pain Score 2    Pain Location Scapula   Pain Orientation Right   Pain Descriptors / Indicators Spasm   Pain Type Surgical pain   Pain Onset 1 to 4 weeks ago   Pain Frequency Intermittent   Aggravating Factors  laying on right side   Pain Relieving Factors massage   Multiple Pain Sites No            [facility] PT Assessment - 04/26/15 0001    AROM   Right Shoulder Flexion 155 Degrees   Right Shoulder ABduction 169 Degrees   Left Shoulder Flexion 152 Degrees   Left Shoulder ABduction 149 Degrees  [facility] Adult PT Treatment/Exercise - 04/26/15 0001    Shoulder Exercises: Pulleys   Flexion 2 minutes   ABduction 2 minutes   Shoulder Exercises: Therapy Ball   Flexion 10 reps   ABduction 10 reps           PT Education - 04/26/15 0809    Education provided Yes   Education Details Instructed patient with Strength After Breast Cancer strengthening and stretching program; pt performed each exercise with verbal cues for proper technique and PT demo.   Person(s) Educated Patient   Methods Explanation;Demonstration;Tactile cues;Verbal cues;Handout   Comprehension Verbalized understanding;Returned demonstration              Breast Clinic Goals - 01/17/15 1141    Patient will be able to verbalize understanding of pertinent lymphedema risk reduction practices relevant to her diagnosis specifically  related to skin care.   Time 1   Period Days   Status Achieved   Patient will be able to return demonstrate and/or verbalize understanding of the post-op home exercise program related to regaining shoulder range of motion.   Time 1   Period Days   Status Achieved   Patient will be able to verbalize understanding of the importance of attending the postoperative After Breast Cancer Class for further lymphedema risk reduction education and therapeutic exercise.   Time 1   Period Days   Status Achieved          Long Term Clinic Goals - 04/26/15 [id number]    CC Long Term Goal  #1   Title Patient will be able to verbalize understanding of the importance of a walking program and demonstrate a safe home exercise program   Time 4   Period Weeks   Status Achieved   CC Long Term Goal  #2   Title Increase left shoulder flexion and abduction to >/= 150 degrees for increased ease reaching overhead.   Time 4   Period Weeks   Status On-going   CC Long Term Goal  #3   Title Increase sit to stand tolerance to >/= 13 times in 30 seconds to demonstrate increased strength and function   Time 4   Period Weeks   Status On-going            Plan - 04/26/15 0844    Clinical Impression Statement Patient tolerated all exercises well but had difficulty getting up from the floor without assistance.  She will benefit from continued strengthening and possibly yoga. Shoulder ROM improved from eval bilaterally.   Pt will benefit from skilled therapeutic intervention in order to improve on the following deficits Decreased strength;Decreased scar mobility;Pain;Decreased knowledge of precautions;Impaired UE functional use;Decreased range of motion   Rehab Potential Excellent   Clinical Impairments Affecting Rehab Potential none   PT Frequency 2x / week   PT Duration 4 weeks   PT Treatment/Interventions Therapeutic exercise;Patient/family education;Manual techniques;Passive range of motion;Scar  mobilization;ADLs/Self Care Home Management   PT Next Visit Plan Review Strength After ABC program; test sit to stand in 30 seconds        Problem List Patient Active Problem List   Diagnosis Date Noted  . DCIS (ductal carcinoma in situ) 03/14/2015  . Breast cancer of lower-outer quadrant of left female breast ([place]) 01/12/2015  . Adhesive capsulitis 11/23/2013  . Subacromial bursitis 11/23/2013  . Varicose veins 11/17/2013  . Edema 11/17/2013  . HYPERLIPIDEMIA 04/16/2010  . Elevated blood pressure reading without diagnosis of hypertension 04/16/2010  . ANXIETY STATE NOS  12/15/2006  . DYSFUNCTION, BLADDER NEC 12/15/2006   Annia Friendly, PT 04/26/2015 9:00 AM  [place], [place], [address] Phone: [phone]   Fax:  [phone]  Name: Jennifer Park MRN: [id number] Date of Birth: September 23, 1960

## 2015-04-30 ENCOUNTER — Ambulatory Visit: Payer: Managed Care, Other (non HMO) | Admitting: Physical Therapy

## 2015-04-30 ENCOUNTER — Encounter: Payer: Self-pay | Admitting: Physical Therapy

## 2015-04-30 DIAGNOSIS — R293 Abnormal posture: Secondary | ICD-10-CM

## 2015-04-30 DIAGNOSIS — C50512 Malignant neoplasm of lower-outer quadrant of left female breast: Secondary | ICD-10-CM

## 2015-04-30 DIAGNOSIS — Z9013 Acquired absence of bilateral breasts and nipples: Secondary | ICD-10-CM

## 2015-04-30 DIAGNOSIS — R531 Weakness: Secondary | ICD-10-CM

## 2015-04-30 DIAGNOSIS — M25612 Stiffness of left shoulder, not elsewhere classified: Secondary | ICD-10-CM

## 2015-04-30 NOTE — Therapy (Signed)
[place], [place], [address] Phone: [phone]   Fax:  [phone]  Physical Therapy Treatment  Patient Details  Name: Jennifer Park MRN: [id number] Date of Birth: 12/30/1960 Referring Provider: Dr. Autumn Messing  Encounter Date: 04/30/2015      PT End of Session - 04/30/15 1548    Visit Number 4   Number of Visits 8   Date for PT Re-Evaluation 05/14/15   PT Start Time 9741   PT Stop Time 1540  Pt declined additional treatment today   PT Time Calculation (min) 25 min   Activity Tolerance Patient tolerated treatment well   Behavior During Therapy [facility] for tasks assessed/performed      Past Medical History  Diagnosis Date  . Anxiety     situational  . Hyperlipidemia   . Allergy   . Breast cancer of lower-outer quadrant of left female breast ([place]) 01/12/2015  . Claustrophobia   . Depression   . PONV (postoperative nausea and vomiting)     wasn't sure if it was related to morphine or epidural  . GERD (gastroesophageal reflux disease)   . Anemia     history of anemia 5 yrs.  . Claustrophobia   . Asthma     MILD / RARE    Past Surgical History  Procedure Laterality Date  . Ganglion cyst excision    . Abdominal hysterectomy      for abnormal cytology; Dr Irven Baltimore  . Tonsillectomy    . No colonoscopy      11/17/13 SOC reviewed  . Varicose vein surgery      as OP X2  . Cesarean section    . Mastectomy w/ sentinel node biopsy Bilateral 03/14/2015  . Tonsillectomy    . Mastectomy w/ sentinel node biopsy Bilateral 03/14/2015    Procedure: BILATERAL MASTECTOMY WITH LEFT SENTINEL LYMPH NODE BIOPSY;  Surgeon: [name], MD;  Location: [place];  Service: General;  Laterality: Bilateral;    There were no vitals filed for this visit.  Visit Diagnosis:  Abnormal posture  Status post mastectomy, bilateral  Shoulder stiffness, left  Generalized weakness  Carcinoma of lower outer quadrant  of left breast (HCC)      Subjective Assessment - 04/30/15 1516    Subjective I feel like I'm doing fine.  I had a massage and it was fabulous.    Pertinent History Patient reports she underwent a bilateral mastectomy and left sentinel node biopsy for left high grade DCIS on 03/14/15.  She has temporary prostheses and plans to go later this week for permanent prostheses.  Cancer was ER/PR positive but reports she does not need hormone therapy due to having a bilateral mastectomy.   Patient Stated Goals Increase strength and shoulder ROM   Currently in Pain? No/denies            [facility] PT Assessment - 04/30/15 0001    Sit to Stand   Comments Able to perform sit to stand without UEs 13 times in 30 seconds; an improvement of 2 times   AROM   Right Shoulder Flexion 156 Degrees   Right Shoulder ABduction 174 Degrees   Left Shoulder Flexion 152 Degrees   Left Shoulder ABduction 163 Degrees                     [facility] Adult PT Treatment/Exercise - 04/30/15 0001    Shoulder Exercises: Therapy Ball   Flexion 10 reps  ABduction 10 reps                      Breast Clinic Goals - 01/17/15 1141    Patient will be able to verbalize understanding of pertinent lymphedema risk reduction practices relevant to her diagnosis specifically related to skin care.   Time 1   Period Days   Status Achieved   Patient will be able to return demonstrate and/or verbalize understanding of the post-op home exercise program related to regaining shoulder range of motion.   Time 1   Period Days   Status Achieved   Patient will be able to verbalize understanding of the importance of attending the postoperative After Breast Cancer Class for further lymphedema risk reduction education and therapeutic exercise.   Time 1   Period Days   Status Achieved          Long Term Clinic Goals - 04/30/15 1526    CC Long Term Goal  #1   Title Patient will be able to verbalize understanding  of the importance of a walking program and demonstrate a safe home exercise program   Time 4   Period Weeks   Status Achieved   CC Long Term Goal  #2   Title Increase left shoulder flexion and abduction to >/= 150 degrees for increased ease reaching overhead.   Time 4   Period Weeks   Status Achieved   CC Long Term Goal  #3   Title Increase sit to stand tolerance to >/= 13 times in 30 seconds to demonstrate increased strength and function   Time 4   Period Weeks   Status Achieved            Plan - 04/30/15 1548    Clinical Impression Statement Patient has met all goals and feels ready for discharge.  SShe declined any manaul therapy today due to feeling great and ready to manage on her own.  No deficits noted.   Pt will benefit from skilled therapeutic intervention in order to improve on the following deficits Decreased strength;Decreased scar mobility;Pain;Decreased knowledge of precautions;Impaired UE functional use;Decreased range of motion   Rehab Potential Excellent   Clinical Impairments Affecting Rehab Potential none   PT Frequency 2x / week   PT Duration 4 weeks   PT Treatment/Interventions Therapeutic exercise;Patient/family education;Manual techniques;Passive range of motion;Scar mobilization;ADLs/Self Care Home Management   PT Next Visit Plan Discharge patient   Consulted and Agree with Plan of Care Patient        Problem List Patient Active Problem List   Diagnosis Date Noted  . DCIS (ductal carcinoma in situ) 03/14/2015  . Breast cancer of lower-outer quadrant of left female breast ([place]) 01/12/2015  . Adhesive capsulitis 11/23/2013  . Subacromial bursitis 11/23/2013  . Varicose veins 11/17/2013  . Edema 11/17/2013  . HYPERLIPIDEMIA 04/16/2010  . Elevated blood pressure reading without diagnosis of hypertension 04/16/2010  . ANXIETY STATE NOS 12/15/2006  . DYSFUNCTION, BLADDER NEC 12/15/2006   Annia Friendly, PT 04/30/2015 3:50 PM  [place], [place], [address] Phone: [phone]   Fax:  [phone]  Name: Jennifer Park MRN: [id number] Date of Birth: 1960-12-28   PHYSICAL THERAPY DISCHARGE SUMMARY  Visits from Start of Care: 4  Current functional level related to goals / functional outcomes: All goals met.  See above.   Remaining deficits: None   Education / Equipment: Home exercises; have recommended she begin  [place] with a therapist trained in working with breast cancer patients [place], PTA)  Plan: Patient agrees to discharge.  Patient goals were met. Patient is being discharged due to meeting the stated rehab goals.  ?????       Annia Friendly, [place] 04/30/2015 3:51 PM

## 2015-05-23 ENCOUNTER — Encounter: Payer: Self-pay | Admitting: Internal Medicine

## 2015-05-23 ENCOUNTER — Ambulatory Visit (INDEPENDENT_AMBULATORY_CARE_PROVIDER_SITE_OTHER): Payer: Managed Care, Other (non HMO) | Admitting: Internal Medicine

## 2015-05-23 VITALS — BP 142/88 | HR 81 | Temp 98.1°F | Resp 16 | Wt 272.0 lb

## 2015-05-23 DIAGNOSIS — D0512 Intraductal carcinoma in situ of left breast: Secondary | ICD-10-CM

## 2015-05-23 DIAGNOSIS — K219 Gastro-esophageal reflux disease without esophagitis: Secondary | ICD-10-CM

## 2015-05-23 DIAGNOSIS — R600 Localized edema: Secondary | ICD-10-CM | POA: Diagnosis not present

## 2015-05-23 DIAGNOSIS — F419 Anxiety disorder, unspecified: Secondary | ICD-10-CM

## 2015-05-23 DIAGNOSIS — Z1211 Encounter for screening for malignant neoplasm of colon: Secondary | ICD-10-CM

## 2015-05-23 DIAGNOSIS — R030 Elevated blood-pressure reading, without diagnosis of hypertension: Secondary | ICD-10-CM

## 2015-05-23 DIAGNOSIS — E785 Hyperlipidemia, unspecified: Secondary | ICD-10-CM

## 2015-05-23 DIAGNOSIS — Z20828 Contact with and (suspected) exposure to other viral communicable diseases: Secondary | ICD-10-CM

## 2015-05-23 MED ORDER — SERTRALINE HCL 50 MG PO TABS: 50.0000 mg | ORAL_TABLET | Freq: Every day | ORAL | Status: DC

## 2015-05-23 MED ORDER — OSELTAMIVIR PHOSPHATE 75 MG PO CAPS: 75.0000 mg | ORAL_CAPSULE | Freq: Every day | ORAL | Status: DC

## 2015-05-23 MED ORDER — FUROSEMIDE 20 MG PO TABS: 20.0000 mg | ORAL_TABLET | Freq: Every day | ORAL | Status: DC | PRN

## 2015-05-23 MED ORDER — FLUTICASONE PROPIONATE 50 MCG/ACT NA SUSP: 1.0000 | Freq: Every day | NASAL | Status: DC | PRN

## 2015-05-23 MED ORDER — OMEPRAZOLE 20 MG PO CPDR: 20.0000 mg | DELAYED_RELEASE_CAPSULE | Freq: Every day | ORAL | Status: DC

## 2015-05-23 NOTE — Progress Notes (Signed)
Pre visit review using our clinic review tool, if applicable. No additional management support is needed unless otherwise documented below in the visit note. 

## 2015-05-23 NOTE — Assessment & Plan Note (Signed)
Controlled on zoloft Continue current dose

## 2015-05-23 NOTE — Patient Instructions (Signed)
Tamiflu was sent to your pharmacy to help prevent the flu.   Medications reviewed and updated.  No changes recommended at this time.  Your prescription(s) have been submitted to your pharmacy. Please take as directed and contact our office if you believe you are having problem(s) with the medication(s).   Please followup annually for a physical exam.

## 2015-05-23 NOTE — Assessment & Plan Note (Signed)
Not on medication - just lasix for edema BP high her today She has started to exercise regularly and is working on weight loss Low sodium diet Advised her to monitor  No meds at this time

## 2015-05-23 NOTE — Progress Notes (Signed)
Subjective:    Patient ID: Jennifer Park, female    DOB: 03-04-1961, 55 y.o.   MRN: [id number]  HPI She is here to establish with a new pcp and is here for follow up.  Her husband was recently diagnosed with the flu.  She just started to feel today that has chest congestion, nasal congestion and does not feel well.  She really wants to avoid the flu and would ideally like to take medication to help prevent it.     Breast cancer: She had a double mastectomy last year and has not needed any additional treatment. She did not have reconstructive surgery and is very happy with her decision.   Anxiety: She is taking her medication daily as prescribed. She denies any side effects from the medication. She feels her anxiety is well controlled and she is happy with her current dose of medication.   GERD:  She is taking her medication daily as prescribed.  She denies any GERD symptoms and feels her GERD is well controlled.   Edema, varicose veins:  She had a procedure years ago for her veins and it did help.  She has daily swelling and takes lasix.  Her edema is fairly controlled.  She does not feel that she needs any vein procedure at this time.   Medications and allergies reviewed with patient and updated if appropriate.  Patient Active Problem List   Diagnosis Date Noted  . DCIS (ductal carcinoma in situ) 03/14/2015  . Breast cancer of lower-outer quadrant of left female breast ([place]) 01/12/2015  . Adhesive capsulitis 11/23/2013  . Subacromial bursitis 11/23/2013  . Varicose veins 11/17/2013  . Edema 11/17/2013  . HYPERLIPIDEMIA 04/16/2010  . Elevated blood pressure reading without diagnosis of hypertension 04/16/2010  . ANXIETY STATE NOS 12/15/2006  . DYSFUNCTION, BLADDER NEC 12/15/2006    Current Outpatient Prescriptions on File Prior to Visit  Medication Sig Dispense Refill  . aspirin-acetaminophen-caffeine (EXCEDRIN MIGRAINE) 250-250-65 MG tablet Take 2 tablets by mouth every 6 (six)  hours as needed for headache.    . cetirizine (ZYRTEC) 10 MG tablet Take 10 mg by mouth daily.    . fluticasone (FLONASE) 50 MCG/ACT nasal spray Place 1 spray into both nostrils daily as needed.     . furosemide (LASIX) 20 MG tablet 1 qd prn (Patient taking differently: Take 20 mg by mouth daily as needed for fluid or edema. ) 30 tablet 3  . omeprazole (PRILOSEC) 20 MG capsule Take 20 mg by mouth daily.  1  . sertraline (ZOLOFT) 50 MG tablet Take 1 tablet (50 mg total) by mouth daily. 30 tablet 2  . [DISCONTINUED] TOVIAZ 4 MG TB24 TAKE 1 TABLET BY MOUTH EVERY DAY 30 tablet 5   No current facility-administered medications on file prior to visit.    Past Medical History  Diagnosis Date  . Anxiety     situational  . Hyperlipidemia   . Allergy   . Breast cancer of lower-outer quadrant of left female breast ([place]) 01/12/2015  . Claustrophobia   . Depression   . PONV (postoperative nausea and vomiting)     wasn't sure if it was related to morphine or epidural  . GERD (gastroesophageal reflux disease)   . Anemia     history of anemia 5 yrs.  . Claustrophobia   . Asthma     MILD / RARE    Past Surgical History  Procedure Laterality Date  . Ganglion cyst excision    .  Abdominal hysterectomy      for abnormal cytology; Dr Irven [place]  . Tonsillectomy    . No colonoscopy      11/17/13 SOC reviewed  . Varicose vein surgery      as OP X2  . Cesarean section    . Mastectomy w/ sentinel node biopsy Bilateral 03/14/2015  . Tonsillectomy    . Mastectomy w/ sentinel node biopsy Bilateral 03/14/2015    Procedure: BILATERAL MASTECTOMY WITH LEFT SENTINEL LYMPH NODE BIOPSY;  Surgeon: [name], MD;  Location: [place];  Service: General;  Laterality: Bilateral;    Social History   Social History  . Marital Status: Married    Spouse Name: N/A  . Number of Children: N/A  . Years of Education: N/A   Social History Main Topics  . Smoking status: Former Smoker    Quit date: 03/31/1984  .  Smokeless tobacco: Never Used     Comment: age [age] , up to 1 ppd  . Alcohol Use: 0.6 oz/week    1 Glasses of wine per week     Comment: OCCASIONAL  . Drug Use: No  . Sexual Activity: Not on file   Other Topics Concern  . Not on file   Social History Narrative    Family History  Problem Relation Age of Onset  . Bipolar disorder Mother   . Breast cancer Mother   . Cancer Mother   . Diabetes Father   . Kidney failure Father   . Heart failure Father   . COPD Father   . Heart disease Father   . Hyperlipidemia Father   . Bipolar disorder Sister   . Heart attack Maternal Grandfather     in late 49s  . Stroke Maternal Grandmother     in 90s    Review of Systems  Constitutional: Negative for fever.  HENT: Positive for congestion. Negative for ear pain and sinus pressure.   Respiratory: Negative for cough, shortness of breath and wheezing.        Chest congestion today  Cardiovascular: Positive for leg swelling (chronic, varicose veins). Negative for chest pain and palpitations.  Gastrointestinal: Negative for abdominal pain.  Neurological: Positive for dizziness (mild, today). Negative for light-headedness and headaches.       Objective:   Filed Vitals:   05/23/15 1407  BP: 142/88  Pulse: 81  Temp: 98.1 F (36.7 C)  Resp: 16   Filed Weights   05/23/15 1407  Weight: 272 lb (123.378 kg)   Body mass index is 41.37 kg/(m^2).   Physical Exam Constitutional: Appears well-developed and well-nourished. No distress.  Neck: Neck supple. No tracheal deviation present. No thyromegaly present.  No carotid bruit. No cervical adenopathy.   Cardiovascular: Normal rate, regular rhythm and normal heart sounds.   No murmur heard.  No edema Pulmonary/Chest: Effort normal and breath sounds normal. No respiratory distress. No wheezes.       Assessment & Plan:   Exposed to flu and has started to experience some cold symptoms -- will prescribe tamiflu  See Problem List for  Assessment and Plan of chronic medical problems.  Follow up annually

## 2015-05-23 NOTE — Assessment & Plan Note (Signed)
Not on medication Has started to exercise regularly and working on weight loss Will try lifestyle change first

## 2015-05-23 NOTE — Assessment & Plan Note (Signed)
Controlled Continue daily omeprazole

## 2015-05-23 NOTE — Assessment & Plan Note (Addendum)
Due to venous insufficiency Taking lasix most days Continue at current dose

## 2015-06-18 ENCOUNTER — Telehealth: Payer: Self-pay | Admitting: Nurse Practitioner

## 2015-06-18 NOTE — Telephone Encounter (Signed)
Due to PAL rescheduled 3/30 SCP visit to 4/21 @ 9 am. spoke with patient she is aware of change and new date/time. Date per patient.

## 2015-06-28 ENCOUNTER — Encounter: Payer: Managed Care, Other (non HMO) | Admitting: Nurse Practitioner

## 2015-07-20 ENCOUNTER — Encounter: Payer: Self-pay | Admitting: Nurse Practitioner

## 2015-07-20 ENCOUNTER — Ambulatory Visit (HOSPITAL_BASED_OUTPATIENT_CLINIC_OR_DEPARTMENT_OTHER): Payer: Managed Care, Other (non HMO) | Admitting: Nurse Practitioner

## 2015-07-20 VITALS — BP 137/88 | HR 70 | Temp 98.2°F | Resp 19 | Ht 68.0 in | Wt 271.6 lb

## 2015-07-20 DIAGNOSIS — C50512 Malignant neoplasm of lower-outer quadrant of left female breast: Secondary | ICD-10-CM

## 2015-07-20 DIAGNOSIS — Z853 Personal history of malignant neoplasm of breast: Secondary | ICD-10-CM

## 2015-07-20 NOTE — Progress Notes (Signed)
CLINIC:  Cancer Survivorship   REASON FOR VISIT:  Routine follow-up post-treatment for a recent history of breast cancer.  BRIEF ONCOLOGIC HISTORY:    Breast cancer of lower-outer quadrant of left female breast ([place])   01/03/2015 Mammogram Right breast: the calcifications central to the nipple were felt to be likely benign. In the left breast however there were 3 areas of calcifications felt to be indeterminant   01/12/2015 Initial Biopsy Breast, left, needle core biopsy: DCIS, intermediate to high grade, with necrosis and calcs; ER+ (100%), PR+ (70%)   01/12/2015 Clinical Stage Stage 0: Tis N0   03/14/2015 Definitive Surgery Bilateral mastectomies / left SLNB: LEFT focal DCIS, ample margins; RIGHT negative for malignancy   03/14/2015 Pathologic Stage Stage 0: Tis N0    Radiation Therapy Not warranted S/P mastectomy    Anti-estrogen oral therapy Not warranted S/P mastectomy    INTERVAL HISTORY:  [name] presents to the [facility] today for our initial meeting to review her survivorship care plan detailing her treatment course for breast cancer, as well as monitoring long-term side effects of that treatment, education regarding health maintenance, screening, and overall wellness and health promotion.     Overall, [name] reports feeling quite well since her surgery in December 2016.  She denies fatigue, headache, cough, shortness of breath or bone pain.  She has a good appetite and denies weight loss.  She has not noticed any change along either mastectomy incision.  She denies any chest wall pain following recovery from surgery. All and all, she feels that she is doing exceptionally well.  REVIEW OF SYSTEMS:  General: Denies fever, chills, unintentional weight loss, or generalized fatigue.  HEENT: Denies visual changes, hearing loss, mouth sores or difficulty swallowing. Cardiac: Denies palpitations, chest pain, and lower extremity edema.  Respiratory: Denies wheeze or  dyspnea on exertion.  Breast: As above. GI: Denies abdominal pain, constipation, diarrhea, nausea, or vomiting.  GU: Denies dysuria, hematuria, vaginal bleeding, vaginal discharge, or vaginal dryness.  Musculoskeletal: Denies joint or bone pain.  Neuro: Denies recent fall or numbness / tingling in her extremities. Skin: Denies rash, pruritis, or open wounds.  Psych: Denies depression, anxiety, insomnia, or memory loss.   A 14-point review of systems was completed and was negative, except as noted above.   ONCOLOGY TREATMENT TEAM:  1. Surgeon:  Dr. Marlou Starks at [facility]  2. Medical Oncologist: Dr. Jana Hakim     PAST MEDICAL/SURGICAL HISTORY:  Past Medical History  Diagnosis Date  . Anxiety     situational  . Hyperlipidemia   . Allergy   . Breast cancer of lower-outer quadrant of left female breast ([place]) 01/12/2015  . Claustrophobia   . Depression   . PONV (postoperative nausea and vomiting)     wasn't sure if it was related to morphine or epidural  . GERD (gastroesophageal reflux disease)   . Anemia     history of anemia 5 yrs.  . Claustrophobia   . Asthma     MILD / RARE   Past Surgical History  Procedure Laterality Date  . Ganglion cyst excision    . Tonsillectomy    . No colonoscopy      11/17/13 SOC reviewed  . Varicose vein surgery      as OP X2  . Cesarean section    . Mastectomy w/ sentinel node biopsy Bilateral 03/14/2015  . Tonsillectomy    . Mastectomy w/ sentinel node biopsy Bilateral 03/14/2015    Procedure:  BILATERAL MASTECTOMY WITH LEFT SENTINEL LYMPH NODE BIOPSY;  Surgeon: [name], MD;  Location: [place];  Service: General;  Laterality: Bilateral;  . Abdominal hysterectomy      for abnormal cytology; Dr Irven [place], ovaries left     ALLERGIES:  Allergies  Allergen Reactions  . Codeine Nausea Only  . Morphine Nausea And Vomiting    REACTION: vomiting      CURRENT MEDICATIONS:  Current Outpatient Prescriptions on File Prior to  Visit  Medication Sig Dispense Refill  . aspirin-acetaminophen-caffeine (EXCEDRIN MIGRAINE) 250-250-65 MG tablet Take 2 tablets by mouth every 6 (six) hours as needed for headache.    . cetirizine (ZYRTEC) 10 MG tablet Take 10 mg by mouth daily.    . furosemide (LASIX) 20 MG tablet Take 1 tablet (20 mg total) by mouth daily as needed for fluid or edema. 90 tablet 3  . omeprazole (PRILOSEC) 20 MG capsule Take 1 capsule (20 mg total) by mouth daily. 90 capsule 3  . sertraline (ZOLOFT) 50 MG tablet Take 1 tablet (50 mg total) by mouth daily. 90 tablet 3  . fluticasone (FLONASE) 50 MCG/ACT nasal spray Place 1 spray into both nostrils daily as needed. (Patient not taking: Reported on 07/20/2015) 16 g 5  . [DISCONTINUED] TOVIAZ 4 MG TB24 TAKE 1 TABLET BY MOUTH EVERY DAY 30 tablet 5   No current facility-administered medications on file prior to visit.     ONCOLOGIC FAMILY HISTORY:  Family History  Problem Relation Age of Onset  . Bipolar disorder Mother   . Breast cancer Mother   . Cancer Mother   . Diabetes Father   . Kidney failure Father   . Heart failure Father   . COPD Father   . Heart disease Father   . Hyperlipidemia Father   . Bipolar disorder Sister   . Heart attack Maternal Grandfather     in late 69s  . Stroke Maternal Grandmother     in 90s    GENETIC COUNSELING/TESTING: No   SOCIAL HISTORY:  Jennifer Park is married and lives with her family in [place], [place].  She has 1 child, a son Jennifer Park, who is in school and has high functioning Ausbergers. 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  She is a former smoker, having quit in 1986, and denies any current or history of illicit drug use.  She uses alcohol on occasion, approximately one glass of wine / week.   PHYSICAL EXAMINATION:  Vital Signs: Filed Vitals:   07/20/15 0916  BP: 137/88  Pulse: 70  Temp: 98.2 F (36.8 C)  Resp: 19   ECOG Performance Status: 0  General: Well-nourished,  well-appearing female in no acute distress.  She is unaccompanied in clinic today.   HEENT: Head is atraumatic and normocephalic.  Pupils equal and reactive to light and accomodation. Conjunctivae clear without exudate.  Sclerae anicteric. Oral mucosa is pink, moist, and intact without lesions.  Oropharynx is pink without lesions or erythema.  Lymph: No cervical, supraclavicular, infraclavicular, or axillary lymphadenopathy noted on palpation.  Cardiovascular: Regular rate and rhythm without murmurs, rubs, or gallops. Respiratory: Clear to auscultation bilaterally. Chest expansion symmetric without accessory muscle use on inspiration or expiration.  GI: Abdomen soft and round. No tenderness to palpation. Bowel sounds normoactive in 4 quadrants. GU: Deferred.  Neuro: No focal deficits. Steady gait.  Psych: Mood and affect normal and appropriate for situation.  Extremities: No edema, cyanosis, or clubbing.  Skin: Warm and  dry. No open lesions noted.   LABORATORY DATA:  None for this visit.  DIAGNOSTIC IMAGING:  None for this visit.     ASSESSMENT AND PLAN:   1. Breast cancer: Stage 0 ductal carcinoma in situ of the left breast (12/2014), intermediate to high grade, ER positive, PR positive, S/P bilateral mastectomies (right: benign) in 03/2015 without reconstruction, with no adjuvant radiation nor endocrine therapy recommended due to lack of breast tissue.  Ms. Bathgate is doing well without clinical symptoms worrisome for disease recurrence. She will follow-up with her surgeon, Dr. Marlou Starks, in October 2017 and her medical oncologist,  Dr. Jana Hakim in January 2018 with history and physical examination per surveillance protocol.   A comprehensive survivorship care plan and treatment summary was reviewed with the patient today detailing her breast cancer diagnosis, treatment course, potential late/long-term effects of treatment, appropriate follow-up care with recommendations for the future, and patient  education resources.  A copy of this summary, along with a letter will be sent to the patient's primary care provider via in basket message after today's visit.  Ms. Jennifer Park is welcome to return to the Survivorship Clinic in the future, as needed; no follow-up will be scheduled at this time.    2. Cancer screening:  Due to Ms. Martel's history and her age, she should receive screening for skin cancers and colon cancer. She is S/P hysterectomy. The information and recommendations are listed on the patient's comprehensive care plan/treatment summary and were reviewed in detail with the patient.    3. Health maintenance and wellness promotion: Ms. Getz was encouraged to consume 5-7 servings of fruits and vegetables per day. We reviewed the "Nutrition Rainbow" handout, as well as discussed recommendations to maximize nutrition and minimize recurrence, such as increased intake of fruits, vegetables, lean proteins, and minimizing the intake of red meats and processed foods.  She was also encouraged to engage in moderate to vigorous exercise for 30 minutes per day most days of the week. Ms. Boisvert is going to resume training for a half marathon that she plans on participating in in January 2018 in [place] with [place]. We discussed the [employer] fitness program, which is designed for cancer survivors to help them become more physically fit after cancer treatments.  She was instructed to limit her alcohol consumption and continue to abstain from tobacco use.  A copy of the "Take Control of Your Health" brochure was given to her reinforcing these recommendations.   4. Support services/counseling: It is not uncommon for this period of the patient's cancer care trajectory to be one of many emotions and stressors. We discussed an opportunity for her to participate in the next session of [facility] ("Finding Your New Normal") support group series designed for patients after they have completed treatment.  Ms. Starr was  encouraged to take advantage of our many other support services programs, support groups, and/or counseling in coping with her new life as a cancer survivor after completing anti-cancer treatment.  She was offered support today through active listening and expressive supportive counseling.  She was given information regarding our available services and encouraged to contact me with any questions or for help enrolling in any of our support group/programs.    A total of 50 minutes of face-to-face time was spent with this patient with greater than 50% of that time in counseling and care-coordination.   Sylvan Cheese, NP  Survivorship Program [place] [phone]   Note: PRIMARY CARE PROVIDER Claudina Lick  [place], [place] [phone]

## 2015-07-20 NOTE — Progress Notes (Signed)
At the completion of her visit, I provided Jennifer Park with a prescription for a Class I compression sleeve and glove to wear when flying.  I asked her if she has any difficulty finding it, to please notify us ASAP.

## 2015-09-14 ENCOUNTER — Emergency Department (HOSPITAL_BASED_OUTPATIENT_CLINIC_OR_DEPARTMENT_OTHER): Payer: Managed Care, Other (non HMO)

## 2015-09-14 ENCOUNTER — Encounter (HOSPITAL_BASED_OUTPATIENT_CLINIC_OR_DEPARTMENT_OTHER): Payer: Self-pay | Admitting: *Deleted

## 2015-09-14 ENCOUNTER — Emergency Department (HOSPITAL_BASED_OUTPATIENT_CLINIC_OR_DEPARTMENT_OTHER)
Admission: EM | Admit: 2015-09-14 | Discharge: 2015-09-14 | Disposition: A | Discharge status: Home / Self Care | Payer: Managed Care, Other (non HMO) | Attending: Emergency Medicine | Admitting: Emergency Medicine

## 2015-09-14 DIAGNOSIS — R1032 Left lower quadrant pain: Secondary | ICD-10-CM | POA: Diagnosis present

## 2015-09-14 DIAGNOSIS — N83202 Unspecified ovarian cyst, left side: Secondary | ICD-10-CM | POA: Diagnosis not present

## 2015-09-14 DIAGNOSIS — Z87891 Personal history of nicotine dependence: Secondary | ICD-10-CM | POA: Insufficient documentation

## 2015-09-14 DIAGNOSIS — N839 Noninflammatory disorder of ovary, fallopian tube and broad ligament, unspecified: Secondary | ICD-10-CM | POA: Insufficient documentation

## 2015-09-14 DIAGNOSIS — Z853 Personal history of malignant neoplasm of breast: Secondary | ICD-10-CM | POA: Diagnosis not present

## 2015-09-14 DIAGNOSIS — E785 Hyperlipidemia, unspecified: Secondary | ICD-10-CM | POA: Insufficient documentation

## 2015-09-14 DIAGNOSIS — R911 Solitary pulmonary nodule: Secondary | ICD-10-CM | POA: Diagnosis not present

## 2015-09-14 DIAGNOSIS — J45909 Unspecified asthma, uncomplicated: Secondary | ICD-10-CM | POA: Insufficient documentation

## 2015-09-14 DIAGNOSIS — N838 Other noninflammatory disorders of ovary, fallopian tube and broad ligament: Secondary | ICD-10-CM

## 2015-09-14 DIAGNOSIS — Z79899 Other long term (current) drug therapy: Secondary | ICD-10-CM | POA: Diagnosis not present

## 2015-09-14 LAB — COMPREHENSIVE METABOLIC PANEL
ALBUMIN: 3.8 g/dL (ref 3.5–5.0)
ALK PHOS: 95 U/L (ref 38–126)
ALT: 21 U/L (ref 14–54)
ANION GAP: 7 (ref 5–15)
AST: 25 U/L (ref 15–41)
BUN: 10 mg/dL (ref 6–20)
CALCIUM: 8.4 mg/dL — AB (ref 8.9–10.3)
CO2: 27 mmol/L (ref 22–32)
Chloride: 104 mmol/L (ref 101–111)
Creatinine, Ser: 0.8 mg/dL (ref 0.44–1.00)
GFR calc non Af Amer: >60 mL/min (ref >60)
GLUCOSE: 99 mg/dL (ref 65–99)
POTASSIUM: 4 mmol/L (ref 3.5–5.1)
SODIUM: 138 mmol/L (ref 135–145)
TOTAL PROTEIN: 7.1 g/dL (ref 6.5–8.1)
Total Bilirubin: 0.5 mg/dL (ref 0.3–1.2)

## 2015-09-14 LAB — CBC WITH DIFFERENTIAL/PLATELET
BASOS PCT: 1 %
Basophils Absolute: 0 10*3/uL (ref 0.0–0.1)
EOS ABS: 0.1 10*3/uL (ref 0.0–0.7)
EOS PCT: 2 %
HCT: 41.8 % (ref 36.0–46.0)
Hemoglobin: 13.5 g/dL (ref 12.0–15.0)
LYMPHS ABS: 1.2 10*3/uL (ref 0.7–4.0)
Lymphocytes Relative: 23 %
MCH: 27.3 pg (ref 26.0–34.0)
MCHC: 32.3 g/dL (ref 30.0–36.0)
MCV: 84.4 fL (ref 78.0–100.0)
MONO ABS: 0.4 10*3/uL (ref 0.1–1.0)
MONOS PCT: 7 %
NEUTROS PCT: 67 %
Neutro Abs: 3.5 10*3/uL (ref 1.7–7.7)
Platelets: 194 10*3/uL (ref 150–400)
RBC: 4.95 MIL/uL (ref 3.87–5.11)
RDW: 14.5 % (ref 11.5–15.5)
WBC: 5.1 10*3/uL (ref 4.0–10.5)

## 2015-09-14 LAB — URINALYSIS, ROUTINE W REFLEX MICROSCOPIC
BILIRUBIN URINE: NEGATIVE
GLUCOSE, UA: NEGATIVE mg/dL
HGB URINE DIPSTICK: NEGATIVE
KETONES UR: NEGATIVE mg/dL
Leukocytes, UA: NEGATIVE
NITRITE: NEGATIVE
PH: 6 (ref 5.0–8.0)
Protein, ur: NEGATIVE mg/dL
SPECIFIC GRAVITY, URINE: 1.019 (ref 1.005–1.030)

## 2015-09-14 MED ORDER — NAPROXEN 500 MG PO TABS
500.0000 mg | ORAL_TABLET | Freq: Two times a day (BID) | ORAL | Status: DC
Start: 2015-09-14 — End: 2015-11-14

## 2015-09-14 MED ORDER — ONDANSETRON 4 MG PO TBDP: 4.0000 mg | ORAL_TABLET | Freq: Three times a day (TID) | ORAL | Status: DC | PRN

## 2015-09-14 MED ORDER — IOPAMIDOL (ISOVUE-300) INJECTION 61%
100.0000 mL | Freq: Once | INTRAVENOUS | Status: AC | PRN
  Administered 2015-09-14: 100 mL via INTRAVENOUS

## 2015-09-14 MED ORDER — HYDROCODONE-ACETAMINOPHEN 5-325 MG PO TABS: 1.0000 | ORAL_TABLET | Freq: Four times a day (QID) | ORAL | Status: DC | PRN

## 2015-09-14 MED ORDER — SODIUM CHLORIDE 0.9 % IV BOLUS (SEPSIS)
500.0000 mL | Freq: Once | INTRAVENOUS | Status: AC
  Administered 2015-09-14: 500 mL via INTRAVENOUS

## 2015-09-14 MED FILL — HYDROCODON-APAP 5-325: 5-325 | 7 days supply | Qty: 30 | Fill #0

## 2015-09-14 MED FILL — NAPROXEN 500 MG TABLET: 500 | 15 days supply | Qty: 30 | Fill #0

## 2015-09-14 MED FILL — ONDANSETRON ODT 4 MG TABLET: 4 | 6 days supply | Qty: 20 | Fill #0

## 2015-09-14 NOTE — Discharge Instructions (Signed)
Flank Pain °Flank pain refers to pain that is located on the side of the body between the upper abdomen and the back. The pain may occur over a short period of time (acute) or may be long-term or reoccurring (chronic). It may be mild or severe. Flank pain can be caused by many things. °CAUSES  °Some of the more common causes of flank pain include: °· Muscle strains.   °· Muscle spasms.   °· A disease of your spine (vertebral disk disease).   °· A lung infection (pneumonia).   °· Fluid around your lungs (pulmonary edema).   °· A kidney infection.   °· Kidney stones.   °· A very painful skin rash caused by the chickenpox virus (shingles).   °· Gallbladder disease.   °HOME CARE INSTRUCTIONS  °Home care will depend on the cause of your pain. In general, °· Rest as directed by your caregiver. °· Drink enough fluids to keep your urine clear or pale yellow. °· Only take over-the-counter or prescription medicines as directed by your caregiver. Some medicines may help relieve the pain. °· Tell your caregiver about any changes in your pain. °· Follow up with your caregiver as directed. °SEEK IMMEDIATE MEDICAL CARE IF:  °· Your pain is not controlled with medicine.   °· You have new or worsening symptoms. °· Your pain increases.   °· You have abdominal pain.   °· You have shortness of breath.   °· You have persistent nausea or vomiting.   °· You have swelling in your abdomen.   °· You feel faint or pass out.   °· You have blood in your urine. °· You have a fever or persistent symptoms for more than 2-3 days. °· You have a fever and your symptoms suddenly get worse. °MAKE SURE YOU:  °· Understand these instructions. °· Will watch your condition. °· Will get help right away if you are not doing well or get worse. °  °This information is not intended to replace advice given to you by your health care provider. Make sure you discuss any questions you have with your health care provider. °  °Document Released: 05/08/2005 Document  Revised: 12/10/2011 Document Reviewed: 10/30/2011 °Elsevier Interactive Patient Education ©2016 Elsevier Inc. ° °

## 2015-09-14 NOTE — ED Notes (Signed)
Left flank pain x 2 days. Worse last night. Pain comes and goes. Bloated.

## 2015-09-14 NOTE — ED Provider Notes (Signed)
CSN: [id number]     Arrival date & time 09/14/15  1148 History   First MD Initiated Contact with Patient 09/14/15 1200     Chief Complaint  Patient presents with  . Flank Pain     (Consider location/radiation/quality/duration/timing/severity/associated sxs/prior Treatment) HPI  Left flank pain started yesterday, worse last night, couldn't sleep went to urgent care Waist down, back and around to front, comes in waves. Feels like abdomen is distended, feels like labor, comes in severe waves. Worse with standing.  No trouble going to bathroom-normal urination/BM  Sometimes lower back gets tight, tried yoga stretches which did not help  No hx of nephrolithiasis Past Medical History  Diagnosis Date  . Anxiety     situational  . Hyperlipidemia   . Allergy   . Breast cancer of lower-outer quadrant of left female breast ([place]) 01/12/2015  . Claustrophobia   . Depression   . PONV (postoperative nausea and vomiting)     wasn't sure if it was related to morphine or epidural  . GERD (gastroesophageal reflux disease)   . Anemia     history of anemia 5 yrs.  . Claustrophobia   . Asthma     MILD / RARE   Past Surgical History  Procedure Laterality Date  . Ganglion cyst excision    . Tonsillectomy    . No colonoscopy      11/17/13 SOC reviewed  . Varicose vein surgery      as OP X2  . Cesarean section    . Mastectomy w/ sentinel node biopsy Bilateral 03/14/2015  . Tonsillectomy    . Mastectomy w/ sentinel node biopsy Bilateral 03/14/2015    Procedure: BILATERAL MASTECTOMY WITH LEFT SENTINEL LYMPH NODE BIOPSY;  Surgeon: [name], MD;  Location: [place];  Service: General;  Laterality: Bilateral;  . Abdominal hysterectomy      for abnormal cytology; Dr Irven Baltimore, ovaries left   Family History  Problem Relation Age of Onset  . Bipolar disorder Mother   . Breast cancer Mother   . Cancer Mother   . Diabetes Father   . Kidney failure Father   . Heart failure Father   . COPD  Father   . Heart disease Father   . Hyperlipidemia Father   . Bipolar disorder Sister   . Heart attack Maternal Grandfather     in late 2s  . Stroke Maternal Grandmother     in 42s   Social History  Substance Use Topics  . Smoking status: Former Smoker    Quit date: 03/31/1984  . Smokeless tobacco: Never Used     Comment: age [age] , up to 1 ppd  . Alcohol Use: 0.6 oz/week    1 Glasses of wine per week     Comment: OCCASIONAL   OB History    No data available     Review of Systems  Constitutional: Negative for fever.  HENT: Negative for sore throat.   Eyes: Negative for visual disturbance.  Respiratory: Negative for cough and shortness of breath.   Cardiovascular: Negative for chest pain.  Gastrointestinal: Positive for nausea and abdominal pain. Negative for vomiting, diarrhea (loose stool at baseline) and constipation.  Genitourinary: Positive for flank pain. Negative for dysuria, urgency and difficulty urinating.  Musculoskeletal: Negative for back pain and neck pain.  Skin: Negative for rash.  Neurological: Negative for syncope and headaches.      Allergies  Codeine and Morphine  Home Medications   Prior to Admission medications  Medication Sig Start Date End Date Taking? Authorizing Provider  aspirin-acetaminophen-caffeine (EXCEDRIN MIGRAINE) [phone] MG tablet Take 2 tablets by mouth every 6 (six) hours as needed for headache.    Historical Provider, MD  cetirizine (ZYRTEC) 10 MG tablet Take 10 mg by mouth daily.    Historical Provider, MD  fluticasone (FLONASE) 50 MCG/ACT nasal spray Place 1 spray into both nostrils daily as needed. Patient not taking: Reported on 07/20/2015 05/23/15   Binnie Rail, MD  furosemide (LASIX) 20 MG tablet Take 1 tablet (20 mg total) by mouth daily as needed for fluid or edema. 05/23/15   Binnie Rail, MD  HYDROcodone-acetaminophen (NORCO) 5-325 MG tablet Take 1 tablet by mouth every 6 (six) hours as needed for moderate pain.  09/14/15   Gareth Morgan, MD  naproxen (NAPROSYN) 500 MG tablet Take 1 tablet (500 mg total) by mouth 2 (two) times daily. 09/14/15   Gareth Morgan, MD  omeprazole (PRILOSEC) 20 MG capsule Take 1 capsule (20 mg total) by mouth daily. 05/23/15   Binnie Rail, MD  ondansetron (ZOFRAN ODT) 4 MG disintegrating tablet Take 1 tablet (4 mg total) by mouth every 8 (eight) hours as needed for nausea or vomiting. 09/14/15   Gareth Morgan, MD  sertraline (ZOLOFT) 50 MG tablet Take 1 tablet (50 mg total) by mouth daily. 05/23/15   Binnie Rail, MD   BP 142/81 mmHg  Pulse 64  Temp(Src) 97.6 F (36.4 C) (Oral)  Resp 14  Ht 5\' 8"  (1.727 m)  Wt 271 lb (122.925 kg)  BMI 41.22 kg/m2  SpO2 100% Physical Exam  Constitutional: She is oriented to person, place, and time. She appears well-developed and well-nourished. No distress.  HENT:  Head: Normocephalic and atraumatic.  Eyes: Conjunctivae and EOM are normal.  Neck: Normal range of motion.  Cardiovascular: Normal rate, regular rhythm, normal heart sounds and intact distal pulses.  Exam reveals no gallop and no friction rub.   No murmur heard. Pulmonary/Chest: Effort normal and breath sounds normal. No respiratory distress. She has no wheezes. She has no rales.  Abdominal: Soft. She exhibits no distension. There is tenderness in the left lower quadrant. There is no guarding and no CVA tenderness.  Musculoskeletal: She exhibits no edema or tenderness.  Neurological: She is alert and oriented to person, place, and time.  Skin: Skin is warm and dry. No rash noted. She is not diaphoretic. No erythema.  Nursing note and vitals reviewed.   ED Course  Procedures (including critical care time) Labs Review Labs Reviewed  URINALYSIS, ROUTINE W REFLEX MICROSCOPIC (NOT AT [facility]) - Abnormal; Notable for the following:    APPearance CLOUDY (*)    All other components within normal limits  COMPREHENSIVE METABOLIC PANEL - Abnormal; Notable for the following:     Calcium 8.4 (*)    All other components within normal limits  CBC WITH DIFFERENTIAL/PLATELET    Imaging Review Ct Abdomen Pelvis W Contrast  09/14/2015  CLINICAL DATA:  Left lower quadrant pain. Evaluate for diverticulitis. Recent history of breast cancer with bilateral mastectomy. EXAM: CT ABDOMEN AND PELVIS WITH CONTRAST TECHNIQUE: Multidetector CT imaging of the abdomen and pelvis was performed using the standard protocol following bolus administration of intravenous contrast. CONTRAST:  128mL ISOVUE-300 IOPAMIDOL (ISOVUE-300) INJECTION 61% COMPARISON:  None. FINDINGS: Lower chest: 5 mm nodule along the posterior right lower lobe is indeterminate. This is seen on sequence 4, image 4. No large pleural effusions. Few patchy densities in the lower lungs  could represent atelectasis. Hepatobiliary: Normal appearance of the liver, gallbladder and portal venous system. Pancreas: Normal appearance of the pancreas without inflammation or duct dilatation. Spleen: Normal appearance of spleen without enlargement. Adrenals/Urinary Tract: Normal adrenal glands. All normal appearance of bilateral kidneys and urinary bladder. Stomach/Bowel: Normal appearance of stomach and duodenum. Mild stranding in the central abdominal mesentery is nonspecific. Prominent mesenteric lymph node on sequence 2, image 37 measures up to 1 cm. There is no evidence for bowel dilatation or obstruction. Normal appendix. No acute abnormality to the colon. Vascular/Lymphatic: Prominent varicosities in the right upper thigh raise concern for underlying venous insufficiency in the right lower extremity. Enlarged superficial venous structure in the left proximal thigh. No significant atherosclerotic disease in the aorta or iliac arteries. No aortic aneurysm. Prominent mesenteric lymph node as described. Overall, there is no suspicious lymphadenopathy. Reproductive: Uterus has been removed. Right ovarian tissue is present. There is a large complex  low-density lesion involving the left adnexa or the left ovary. This is concerning for a large cystic neoplasm. This lesion is difficult to measure due to its configuration but roughly measures 12.0 x 6.2 x 7.6 cm. This extends from the left ovary region down to the mid lower pelvis. The most caudal aspect of this lesion is near the vaginal cuff. Other: No free fluid.  Negative for free air. Musculoskeletal: No acute bone abnormality. Disc space narrowing at L5-S1. IMPRESSION: Large complex low-density structure in the pelvis. This appears to be associated with the left ovary and findings are concerning for a cystic neoplasm. Recommend further evaluation with pelvic ultrasound and gynecology consultation. Hysterectomy. Indeterminate 5 mm nodule in the right lung. Based on the history of malignancy, recommend a follow-up chest CT to look for additional nodularity. Enlarged venous structures in the upper thighs, particularly on the right side. Findings are suggestive for underlying venous insufficiency. These results were called by telephone at the time of interpretation on 09/14/2015 at 2:58 pm to Dr. Gareth Morgan , who verbally acknowledged these results. Electronically Signed   By: Markus Daft M.D.   On: 09/14/2015 15:02   I have personally reviewed and evaluated these images and lab results as part of my medical decision-making.   EKG Interpretation None      MDM   Final diagnoses:  Cyst of left ovary  Ovarian mass, left  Left lower quadrant pain  Nodule of right lung   55 year old female with a history of hyperlipidemia, asthma, breast cancer status post bilateral mastectomy in October 2016, presents with concern for left lower quadrant and left back pain. Labs WNL. No sign of UTI. CT was obtained which showed no evidence of diverticulitis, no nephrolithiasis, however is concerning for 12x6x7 cm left ovarian cystic mass.  Do not feel pelvic exam will change course of care.  Patient appears  comfortable during her time in the emergency department, and given appearance, as well as size of mass greater than 10cm, have decreased suspicion for torsion at this time.  Called patient's OBGYN on call and spoke to Dorthula Nettles NP who reviewed CT with OBGYN Dr. Valentino Saxon and pt scheduled for appointment with them on Monday.  Gave rx for naproxen, norco and zofran.  Discussed return precautions, including worsening pain. Patient discharged in stable condition with understanding of reasons to return.    Gareth Morgan, MD 09/14/15 1705

## 2015-09-14 NOTE — ED Notes (Signed)
MD at bedside. 

## 2015-09-15 ENCOUNTER — Ambulatory Visit: Payer: Managed Care, Other (non HMO) | Admitting: Family Medicine

## 2015-09-15 DIAGNOSIS — Z0289 Encounter for other administrative examinations: Secondary | ICD-10-CM

## 2015-09-17 ENCOUNTER — Telehealth: Payer: Self-pay | Admitting: Nurse Practitioner

## 2015-09-17 NOTE — Telephone Encounter (Signed)
Received notification via EPIC regarding patient's visit to ED on Friday 09/14/2015 for abdominal / pelvic pain with imaging revealing the presence of a left ovarian / adnexal mass.  Called and spoke with patient.  Jennifer Park has follow up appointment with GYN today (09/17/2015) at 3:30 pm with Jennifer Lah NP.  She believes that she is also seeing one of the gynecologists, but she is not sure which one.  She states that they are planning to perform a transvaginal U/S to obtain more information.  Discussed broadly potential next steps regarding workup of ovarian /adnexal mass.  Deferred those decisions to GYN.  Asked pt to keep [place] posted and she stated that she would. Next follow up appointment in medical oncology is with Dr. Jana Hakim in 04/2015, but can facilitate sooner appointment if needed.  Will route this telephone note to Dr. Jana Hakim so that he is aware of ED visit, imaging results, and follow up visit with GYN scheduled for later today. Pt without other questions; to keep follow up appointment with GYN as above.

## 2015-09-18 ENCOUNTER — Other Ambulatory Visit: Payer: Self-pay | Admitting: Oncology

## 2015-09-20 ENCOUNTER — Other Ambulatory Visit: Payer: Self-pay | Admitting: Oncology

## 2015-09-20 ENCOUNTER — Telehealth: Payer: Self-pay | Admitting: Nurse Practitioner

## 2015-09-20 ENCOUNTER — Other Ambulatory Visit: Payer: Self-pay | Admitting: Nurse Practitioner

## 2015-09-20 DIAGNOSIS — N838 Other noninflammatory disorders of ovary, fallopian tube and broad ligament: Secondary | ICD-10-CM

## 2015-09-20 NOTE — Telephone Encounter (Signed)
Received copy of transvaginal u/s results (left adnexal mass - can't exclude that it is ovarian in origin - nonvascular, mainly cystic with internal septation and 2 nodules, measuring 2.3 x 1.2 x 1.0 cm, 0.8 x 0.5, 1.6 cm, with fluid containing low level echoes) and CA 125 (12.2 U/ml) results from [place] at [facility].  Discussed with Ms. [facility] medical oncologist, Dr. Jana Hakim, and we will enter referral to Everitt Amber MD.  Called and discussed with Ms. Bruneau who is in agreement with this plan. No additional questions at this time.

## 2015-10-01 ENCOUNTER — Ambulatory Visit: Payer: Managed Care, Other (non HMO) | Attending: Gynecologic Oncology | Admitting: Gynecologic Oncology

## 2015-10-01 ENCOUNTER — Encounter: Payer: Self-pay | Admitting: Gynecologic Oncology

## 2015-10-01 VITALS — BP 138/85 | HR 69 | Temp 98.0°F | Resp 17 | Ht 68.0 in | Wt 277.4 lb

## 2015-10-01 DIAGNOSIS — F329 Major depressive disorder, single episode, unspecified: Secondary | ICD-10-CM | POA: Diagnosis not present

## 2015-10-01 DIAGNOSIS — D649 Anemia, unspecified: Secondary | ICD-10-CM | POA: Diagnosis not present

## 2015-10-01 DIAGNOSIS — E669 Obesity, unspecified: Secondary | ICD-10-CM | POA: Insufficient documentation

## 2015-10-01 DIAGNOSIS — Z86000 Personal history of in-situ neoplasm of breast: Secondary | ICD-10-CM | POA: Diagnosis not present

## 2015-10-01 DIAGNOSIS — F40240 Claustrophobia: Secondary | ICD-10-CM | POA: Insufficient documentation

## 2015-10-01 DIAGNOSIS — Z9013 Acquired absence of bilateral breasts and nipples: Secondary | ICD-10-CM

## 2015-10-01 DIAGNOSIS — R1900 Intra-abdominal and pelvic swelling, mass and lump, unspecified site: Secondary | ICD-10-CM | POA: Diagnosis present

## 2015-10-01 DIAGNOSIS — Z8249 Family history of ischemic heart disease and other diseases of the circulatory system: Secondary | ICD-10-CM | POA: Diagnosis not present

## 2015-10-01 DIAGNOSIS — Z853 Personal history of malignant neoplasm of breast: Secondary | ICD-10-CM | POA: Diagnosis not present

## 2015-10-01 DIAGNOSIS — N83202 Unspecified ovarian cyst, left side: Secondary | ICD-10-CM | POA: Insufficient documentation

## 2015-10-01 DIAGNOSIS — Z803 Family history of malignant neoplasm of breast: Secondary | ICD-10-CM | POA: Insufficient documentation

## 2015-10-01 DIAGNOSIS — F419 Anxiety disorder, unspecified: Secondary | ICD-10-CM | POA: Diagnosis not present

## 2015-10-01 DIAGNOSIS — Z818 Family history of other mental and behavioral disorders: Secondary | ICD-10-CM | POA: Insufficient documentation

## 2015-10-01 DIAGNOSIS — E785 Hyperlipidemia, unspecified: Secondary | ICD-10-CM | POA: Insufficient documentation

## 2015-10-01 DIAGNOSIS — Z823 Family history of stroke: Secondary | ICD-10-CM | POA: Insufficient documentation

## 2015-10-01 DIAGNOSIS — K219 Gastro-esophageal reflux disease without esophagitis: Secondary | ICD-10-CM | POA: Diagnosis not present

## 2015-10-01 DIAGNOSIS — Z90710 Acquired absence of both cervix and uterus: Secondary | ICD-10-CM | POA: Insufficient documentation

## 2015-10-01 DIAGNOSIS — Z87891 Personal history of nicotine dependence: Secondary | ICD-10-CM | POA: Diagnosis not present

## 2015-10-01 DIAGNOSIS — Z170 Estrogen receptor positive status [ER+]: Secondary | ICD-10-CM | POA: Diagnosis not present

## 2015-10-01 DIAGNOSIS — Z7982 Long term (current) use of aspirin: Secondary | ICD-10-CM | POA: Diagnosis not present

## 2015-10-01 DIAGNOSIS — Z6841 Body Mass Index (BMI) 40.0 and over, adult: Secondary | ICD-10-CM | POA: Insufficient documentation

## 2015-10-01 DIAGNOSIS — Z833 Family history of diabetes mellitus: Secondary | ICD-10-CM | POA: Diagnosis not present

## 2015-10-01 DIAGNOSIS — J45909 Unspecified asthma, uncomplicated: Secondary | ICD-10-CM | POA: Diagnosis not present

## 2015-10-01 NOTE — Progress Notes (Signed)
Consult Note: Gyn-Onc  Consult was requested by Dr. Jana Hakim for the evaluation of Jennifer Park 55 y.o. female  CC:  Chief Complaint  Patient presents with  . Adnexal mass    New Consultation    Assessment/Plan:  Jennifer Park  is a 55 y.o.  year old with a 12cm mostly cystic left ovarian cyst, obesity (BMI 61) and a history of bilateral DCIS s/p mastectomy. She is s/p hysterectomy in 2009 for CAH.  I reviewed the patient's CT images from 09/14/15 with the patient. I discussed that I have a low suspicion for malignancy based on its uniform appearance, mostly cystic composition and her normal CA 125 (12). However, given that it is (possibly) mildly symptomatic, her postmenopausal age, and her anxiety about a potential underlying malignancy, and her acceptable surgical risk profile, we have decided to proceed with surgical excision (robotic assisted BSO, possible staging).  I discussed operative risks including  bleeding, infection, damage to internal organs (such as bladder,ureters, bowels), blood clot, reoperation and rehospitalization. I discussed that her underlying obesity increases her risk for these complications.  I discussed the anticipated hospital stay (outpatient) and postoperative recovery and restrictions.  HPI: Jennifer Park is a 55 year old G1P1 who is seen in consultation at the request of Dr Jana Hakim for a left ovarian mass. The patient has a remote history of endometrial complex hyperplasia with atypia in 2009 treated with laparoscopic total hysterectomy (no BSO as she was premenopausal).  She subsequently developed ER/PR positive DCIS treated with bilateral mastectomies in December 2016.   In June, 2017 she developed an episode of sharp lower abdominal pain and was seen in the ER at [facility] on 09/04/15. The CT scan showed a 12x6x7.7cm left ovarian cystic mass arising from the left ovary and a 75m nodule on the right posterior lobe that was indeterminate.   A transvaginal  [place] performed that showed cyst with 2 nodules one measuring 2.3x1.2x1cm and hte other measuring 1.6x0.5x0.8cm). CA 125 was drawn and was nromal at 12.2U/mL.  The patient's pain has substantially improved in the past month. She takes naprosyn for this.  She is obese with a BMI of 42 and she has had 2 prior surgeries (one cesarean and one laparoscopic hysterectomy).  Jennifer JHewardhas a mother with a history of premenopausal breast cancer. No family history of ovarian cancer. She has not undergone BRCA testing.  Current Meds:  Outpatient Encounter Prescriptions as of 10/01/2015  Medication Sig  . aspirin-acetaminophen-caffeine (EXCEDRIN MIGRAINE) 250-250-65 MG tablet Take 2 tablets by mouth every 6 (six) hours as needed for headache.  . cetirizine (ZYRTEC) 10 MG tablet Take 10 mg by mouth daily.  . fluticasone (FLONASE) 50 MCG/ACT nasal spray Place 1 spray into both nostrils daily as needed.  . furosemide (LASIX) 20 MG tablet Take 1 tablet (20 mg total) by mouth daily as needed for fluid or edema.  . naproxen (NAPROSYN) 500 MG tablet Take 1 tablet (500 mg total) by mouth 2 (two) times daily.  .Marland Kitchenomeprazole (PRILOSEC) 20 MG capsule Take 1 capsule (20 mg total) by mouth daily.  . sertraline (ZOLOFT) 50 MG tablet Take 1 tablet (50 mg total) by mouth daily.  .Marland KitchenHYDROcodone-acetaminophen (NORCO) 5-325 MG tablet Take 1 tablet by mouth every 6 (six) hours as needed for moderate pain. (Patient not taking: Reported on 10/01/2015)  . ondansetron (ZOFRAN ODT) 4 MG disintegrating tablet Take 1 tablet (4 mg total) by mouth every 8 (eight) hours as  needed for nausea or vomiting. (Patient not taking: Reported on 10/01/2015)   No facility-administered encounter medications on file as of 10/01/2015.    Allergy:  Allergies  Allergen Reactions  . Codeine Nausea Only  . Morphine Nausea And Vomiting    REACTION: vomiting     Social Hx:   Social History   Social History  . Marital Status: Married    Spouse Name:  N/A  . Number of Children: N/A  . Years of Education: N/A   Occupational History  . Not on file.   Social History Main Topics  . Smoking status: Former Smoker    Quit date: 03/31/1984  . Smokeless tobacco: Never Used     Comment: age [age] , up to 1 ppd  . Alcohol Use: 0.6 oz/week    1 Glasses of wine per week     Comment: OCCASIONAL  . Drug Use: No  . Sexual Activity: Not on file   Other Topics Concern  . Not on file   Social History Narrative    Past Surgical Hx:  Past Surgical History  Procedure Laterality Date  . Ganglion cyst excision    . Tonsillectomy    . No colonoscopy      11/17/13 SOC reviewed  . Varicose vein surgery      as OP X2  . Cesarean section    . Mastectomy w/ sentinel node biopsy Bilateral 03/14/2015  . Tonsillectomy    . Mastectomy w/ sentinel node biopsy Bilateral 03/14/2015    Procedure: BILATERAL MASTECTOMY WITH LEFT SENTINEL LYMPH NODE BIOPSY;  Surgeon: [name], MD;  Location: [place];  Service: General;  Laterality: Bilateral;  . Abdominal hysterectomy      for abnormal cytology; Dr Irven Baltimore, ovaries left    Past Medical Hx:  Past Medical History  Diagnosis Date  . Anxiety     situational  . Hyperlipidemia   . Allergy   . Breast cancer of lower-outer quadrant of left female breast ([place]) 01/12/2015  . Claustrophobia   . Depression   . PONV (postoperative nausea and vomiting)     wasn't sure if it was related to morphine or epidural  . GERD (gastroesophageal reflux disease)   . Anemia     history of anemia 5 yrs.  . Claustrophobia   . Asthma     MILD / RARE    Past Gynecological History:  G1P1 (cesarean), CAH s/p hysterectomy  No LMP recorded. Patient has had a hysterectomy.  Family Hx:  Family History  Problem Relation Age of Onset  . Bipolar disorder Mother   . Breast cancer Mother   . Cancer Mother   . Diabetes Father   . Kidney failure Father   . Heart failure Father   . COPD Father   . Heart disease Father    . Hyperlipidemia Father   . Bipolar disorder Sister   . Heart attack Maternal Grandfather     in late 25s  . Stroke Maternal Grandmother     in 90s    Review of Systems:  Constitutional  Feels well,    ENT Normal appearing ears and nares bilaterally Skin/Breast  No rash, sores, jaundice, itching, dryness Cardiovascular  No chest pain, shortness of breath, or edema  Pulmonary  No cough or wheeze.  Gastro Intestinal  No nausea, vomitting, or diarrhoea. No bright red blood per rectum, no abdominal pain, change in bowel movement, or constipation.  Genito Urinary  No frequency, urgency, dysuria, occasional pelvic  pain Musculo Skeletal  No myalgia, arthralgia, joint swelling or pain  Neurologic  No weakness, numbness, change in gait,  Psychology  No depression, anxiety, insomnia.   Vitals:  Blood pressure 138/85, pulse 69, temperature 98 F (36.7 C), temperature source Oral, resp. rate 17, height '5\' 8"'  (1.727 m), weight 277 lb 6.4 oz (125.828 kg), SpO2 97 %.  Physical Exam: WD in NAD Neck  Supple NROM, without any enlargements.  Lymph Node Survey No cervical supraclavicular or inguinal adenopathy Cardiovascular  Pulse normal rate, regularity and rhythm. S1 and S2 normal.  Lungs  Clear to auscultation bilateraly, without wheezes/crackles/rhonchi. Good air movement.  Skin  No rash/lesions/breakdown  Psychiatry  Alert and oriented to person, place, and time  Abdomen  Normoactive bowel sounds, abdomen soft, non-tender and obese without evidence of hernia.  Back No CVA tenderness Genito Urinary  Vulva/vagina: Normal external female genitalia.   No lesions. No discharge or bleeding.  Bladder/urethra:  No lesions or masses, well supported bladder  Vagina: normal  Cervix and Uterus: surgically absent   Adnexa: unable to appreciate mass. Rectal  Good tone, no masses no cul de sac nodularity.  Extremities  No bilateral cyanosis, clubbing or edema.   Donaciano Eva, MD  10/01/2015, 10:51 AM

## 2015-10-01 NOTE — Patient Instructions (Signed)
Preparing for your Surgery  Plan for surgery on July 18 , 2017 with Dr. Everitt Amber for robotic assisted bilateral salpingo-oophorectomy.  Pre-operative Testing -You will receive a phone call from presurgical testing at [facility] to arrange for a pre-operative testing appointment before your surgery.  This appointment normally occurs one to two weeks before your scheduled surgery.   -Bring your insurance card, copy of an advanced directive if applicable, medication list  -At that visit, you will be asked to sign a consent for a possible blood transfusion in case a transfusion becomes necessary during surgery.  The need for a blood transfusion is rare but having consent is a necessary part of your care.     -You should not be taking blood thinners or aspirin at least ten days prior to surgery unless instructed by your surgeon.  Day Before Surgery at [place] will be asked to take in a light diet the day before surgery.  Avoid carbonated beverages.  You will be advised to have nothing to eat or drink after midnight the evening before.     Eat a light diet the day before surgery.  Examples including soups, broths,  toast, yogurt, mashed potatoes.  Things to avoid include carbonated beverages  (fizzy beverages), raw fruits and raw vegetables, or beans.    If your bowels are filled with gas, your surgeon will have difficulty  visualizing your pelvic organs which increases your surgical risks.  Your role in recovery Your role is to become active as soon as directed by your doctor, while still giving yourself time to heal.  Rest when you feel tired. You will be asked to do the following in order to speed your recovery:  - Cough and breathe deeply. This helps toclear and expand your lungs and can prevent pneumonia. You may be given a spirometer to practice deep breathing. A staff member will show you how to use the spirometer. - Do mild physical activity. Walking or moving  your legs help your circulation and body functions return to normal. A staff member will help you when you try to walk and will provide you with simple exercises. Do not try to get up or walk alone the first time. - Actively manage your pain. Managing your pain lets you move in comfort. We will ask you to rate your pain on a scale of zero to 10. It is your responsibility to tell your doctor or nurse where and how much you hurt so your pain can be treated.  Special Considerations -If you are diabetic, you may be placed on insulin after surgery to have closer control over your blood sugars to promote healing and recovery.  This does not mean that you will be discharged on insulin.  If applicable, your oral antidiabetics will be resumed when you are tolerating a solid diet.  -Your final pathology results from surgery should be available by the Friday after surgery and the results will be relayed to you when available.  Blood Transfusion Information WHAT IS A BLOOD TRANSFUSION? A transfusion is the replacement of blood or some of its parts. Blood is made up of multiple cells which provide different functions.  Red blood cells carry oxygen and are used for blood loss replacement.  White blood cells fight against infection.  Platelets control bleeding.  Plasma helps clot blood.  Other blood products are available for specialized needs, such as hemophilia or other clotting disorders. BEFORE THE TRANSFUSION  Who gives blood for transfusions?  You may be able to donate blood to be used at a later date on yourself (autologous donation).  Relatives can be asked to donate blood. This is generally not any safer than if you have received blood from a stranger. The same precautions are taken to ensure safety when a relative's blood is donated.  Healthy volunteers who are fully evaluated to make sure their blood is safe. This is blood bank blood. Transfusion therapy is the safest it has ever been in  the practice of medicine. Before blood is taken from a donor, a complete history is taken to make sure that person has no history of diseases nor engages in risky social behavior (examples are intravenous drug use or sexual activity with multiple partners). The donor's travel history is screened to minimize risk of transmitting infections, such as malaria. The donated blood is tested for signs of infectious diseases, such as HIV and hepatitis. The blood is then tested to be sure it is compatible with you in order to minimize the chance of a transfusion reaction. If you or a relative donates blood, this is often done in anticipation of surgery and is not appropriate for emergency situations. It takes many days to process the donated blood. RISKS AND COMPLICATIONS Although transfusion therapy is very safe and saves many lives, the main dangers of transfusion include:   Getting an infectious disease.  Developing a transfusion reaction. This is an allergic reaction to something in the blood you were given. Every precaution is taken to prevent this. The decision to have a blood transfusion has been considered carefully by your caregiver before blood is given. Blood is not given unless the benefits outweigh the risks.

## 2015-10-10 ENCOUNTER — Encounter (HOSPITAL_COMMUNITY): Payer: Self-pay

## 2015-10-10 ENCOUNTER — Encounter (HOSPITAL_COMMUNITY)
Admission: RE | Admit: 2015-10-10 | Discharge: 2015-10-10 | Disposition: A | Discharge status: Home / Self Care | Payer: Managed Care, Other (non HMO) | Source: Ambulatory Visit | Attending: Gynecologic Oncology | Admitting: Gynecologic Oncology

## 2015-10-10 DIAGNOSIS — Z01812 Encounter for preprocedural laboratory examination: Secondary | ICD-10-CM | POA: Diagnosis present

## 2015-10-10 HISTORY — DX: Reserved for inherently not codable concepts without codable children: IMO0001

## 2015-10-10 LAB — CBC WITH DIFFERENTIAL/PLATELET
BASOS ABS: 0 10*3/uL (ref 0.0–0.1)
BASOS PCT: 0 %
Eosinophils Absolute: 0.1 10*3/uL (ref 0.0–0.7)
Eosinophils Relative: 2 %
HEMATOCRIT: 42.9 % (ref 36.0–46.0)
Hemoglobin: 13.8 g/dL (ref 12.0–15.0)
LYMPHS PCT: 29 %
Lymphs Abs: 1.5 10*3/uL (ref 0.7–4.0)
MCH: 27.4 pg (ref 26.0–34.0)
MCHC: 32.2 g/dL (ref 30.0–36.0)
MCV: 85.3 fL (ref 78.0–100.0)
Monocytes Absolute: 0.3 10*3/uL (ref 0.1–1.0)
Monocytes Relative: 6 %
NEUTROS ABS: 3.3 10*3/uL (ref 1.7–7.7)
Neutrophils Relative %: 63 %
PLATELETS: 208 10*3/uL (ref 150–400)
RBC: 5.03 MIL/uL (ref 3.87–5.11)
RDW: 14.1 % (ref 11.5–15.5)
WBC: 5.2 10*3/uL (ref 4.0–10.5)

## 2015-10-10 LAB — COMPREHENSIVE METABOLIC PANEL
ALBUMIN: 4.2 g/dL (ref 3.5–5.0)
ALT: 31 U/L (ref 14–54)
AST: 36 U/L (ref 15–41)
Alkaline Phosphatase: 104 U/L (ref 38–126)
Anion gap: 7 (ref 5–15)
BILIRUBIN TOTAL: 0.3 mg/dL (ref 0.3–1.2)
BUN: 12 mg/dL (ref 6–20)
CHLORIDE: 103 mmol/L (ref 101–111)
CO2: 29 mmol/L (ref 22–32)
CREATININE: 0.78 mg/dL (ref 0.44–1.00)
Calcium: 8.7 mg/dL — ABNORMAL LOW (ref 8.9–10.3)
GFR calc Af Amer: >60 mL/min (ref >60)
GLUCOSE: 102 mg/dL — AB (ref 65–99)
POTASSIUM: 5 mmol/L (ref 3.5–5.1)
Sodium: 139 mmol/L (ref 135–145)
Total Protein: 7.5 g/dL (ref 6.5–8.1)

## 2015-10-10 LAB — ABO/RH: ABO/RH(D): O NEG

## 2015-10-10 NOTE — Patient Instructions (Signed)
MONEY MADILL  10/10/2015   Your procedure is scheduled on: 10/15/15 Report to [facility] Main  Entrance take [facility]  elevators to 3rd floor to [place] at 8:00am   Call this number if you have problems the morning of surgery [phone]   Remember: ONLY 1 PERSON MAY GO WITH YOU TO SHORT STAY TO GET  READY MORNING OF YOUR SURGERY.  Do not eat food or drink liquids :After Midnight Monday      Take these medicines the morning of surgery with A SIP OF WATER: Zyrtec, Omeprazole, Sertraline, use Flonase                                You may not have any metal on your body including hair pins and              piercings  Do not wear jewelry, make-up, lotions, powders or perfumes, deodorant             Do not wear nail polish.  Do not shave  48 hours prior to surgery.              Do not bring valuables to the hospital. [place].  Contacts, dentures or bridgework may not be worn into surgery     Patients discharged the day of surgery will not be allowed to drive home.  Name and phone number of your driver:Ian Blanch Media               _____________________________________________________________________             [facility] - Preparing for Surgery Before surgery, you can play an important role.  Because skin is not sterile, your skin needs to be as free of germs as possible.  You can reduce the number of germs on your skin by washing with CHG (chlorahexidine gluconate) soap before surgery.  CHG is an antiseptic cleaner which kills germs and bonds with the skin to continue killing germs even after washing. Please DO NOT use if you have an allergy to CHG or antibacterial soaps.  If your skin becomes reddened/irritated stop using the CHG and inform your nurse when you arrive at Short Stay. Do not shave (including legs and underarms) for at least 48 hours prior to the first CHG shower.  You may shave your  face/neck. Please follow these instructions carefully:  1.  Shower with CHG Soap the night before surgery and the  morning of Surgery.  2.  If you choose to wash your hair, wash your hair first as usual with your  normal  shampoo.  3.  After you shampoo, rinse your hair and body thoroughly to remove the  shampoo.                           4.  Use CHG as you would any other liquid soap.  You can apply chg directly  to the skin and wash                       Gently with a scrungie or clean washcloth.  5.  Apply the CHG Soap to your body ONLY FROM THE  NECK DOWN.   Do not use on face/ open                           Wound or open sores. Avoid contact with eyes, ears mouth and genitals (private parts).                       Wash face,  Genitals (private parts) with your normal soap.             6.  Wash thoroughly, paying special attention to the area where your surgery  will be performed.  7.  Thoroughly rinse your body with warm water from the neck down.  8.  DO NOT shower/wash with your normal soap after using and rinsing off  the CHG Soap.                9.  Pat yourself dry with a clean towel.            10.  Wear clean pajamas.            11.  Place clean sheets on your bed the night of your first shower and do not  sleep with pets. Day of Surgery : Do not apply any lotions/deodorants the morning of surgery.  Please wear clean clothes to the hospital/surgery center.  FAILURE TO FOLLOW THESE INSTRUCTIONS MAY RESULT IN THE CANCELLATION OF YOUR SURGERY PATIENT SIGNATURE_________________________________  NURSE SIGNATURE__________________________________  ________________________________________________________________________

## 2015-10-10 NOTE — Pre-Procedure Instructions (Signed)
EKG 2016 in EPIC

## 2015-10-16 ENCOUNTER — Encounter (HOSPITAL_COMMUNITY)
Admission: RE | Disposition: A | Discharge status: Home / Self Care | Payer: Self-pay | Source: Ambulatory Visit | Attending: Gynecologic Oncology

## 2015-10-16 ENCOUNTER — Ambulatory Visit (HOSPITAL_COMMUNITY): Payer: Managed Care, Other (non HMO) | Admitting: Certified Registered Nurse Anesthetist

## 2015-10-16 ENCOUNTER — Ambulatory Visit (HOSPITAL_COMMUNITY)
Admission: RE | Admit: 2015-10-16 | Discharge: 2015-10-16 | Disposition: A | Discharge status: Home / Self Care | Payer: Managed Care, Other (non HMO) | Source: Ambulatory Visit | Attending: Gynecologic Oncology | Admitting: Gynecologic Oncology

## 2015-10-16 ENCOUNTER — Encounter (HOSPITAL_COMMUNITY): Payer: Self-pay | Admitting: Gynecologic Oncology

## 2015-10-16 DIAGNOSIS — Z79891 Long term (current) use of opiate analgesic: Secondary | ICD-10-CM | POA: Diagnosis not present

## 2015-10-16 DIAGNOSIS — K219 Gastro-esophageal reflux disease without esophagitis: Secondary | ICD-10-CM | POA: Insufficient documentation

## 2015-10-16 DIAGNOSIS — C50512 Malignant neoplasm of lower-outer quadrant of left female breast: Secondary | ICD-10-CM | POA: Diagnosis present

## 2015-10-16 DIAGNOSIS — Z7902 Long term (current) use of antithrombotics/antiplatelets: Secondary | ICD-10-CM | POA: Insufficient documentation

## 2015-10-16 DIAGNOSIS — F329 Major depressive disorder, single episode, unspecified: Secondary | ICD-10-CM | POA: Insufficient documentation

## 2015-10-16 DIAGNOSIS — Z7951 Long term (current) use of inhaled steroids: Secondary | ICD-10-CM | POA: Insufficient documentation

## 2015-10-16 DIAGNOSIS — Z87891 Personal history of nicotine dependence: Secondary | ICD-10-CM | POA: Diagnosis not present

## 2015-10-16 DIAGNOSIS — D271 Benign neoplasm of left ovary: Secondary | ICD-10-CM | POA: Diagnosis not present

## 2015-10-16 DIAGNOSIS — I10 Essential (primary) hypertension: Secondary | ICD-10-CM | POA: Insufficient documentation

## 2015-10-16 DIAGNOSIS — N83202 Unspecified ovarian cyst, left side: Secondary | ICD-10-CM | POA: Diagnosis not present

## 2015-10-16 DIAGNOSIS — Z79899 Other long term (current) drug therapy: Secondary | ICD-10-CM | POA: Diagnosis not present

## 2015-10-16 DIAGNOSIS — Z9013 Acquired absence of bilateral breasts and nipples: Secondary | ICD-10-CM | POA: Insufficient documentation

## 2015-10-16 DIAGNOSIS — M1990 Unspecified osteoarthritis, unspecified site: Secondary | ICD-10-CM | POA: Diagnosis not present

## 2015-10-16 DIAGNOSIS — R1909 Other intra-abdominal and pelvic swelling, mass and lump: Secondary | ICD-10-CM | POA: Diagnosis present

## 2015-10-16 DIAGNOSIS — Z6841 Body Mass Index (BMI) 40.0 and over, adult: Secondary | ICD-10-CM | POA: Diagnosis not present

## 2015-10-16 DIAGNOSIS — Z86000 Personal history of in-situ neoplasm of breast: Secondary | ICD-10-CM | POA: Diagnosis not present

## 2015-10-16 HISTORY — PX: ROBOTIC ASSISTED BILATERAL SALPINGO OOPHERECTOMY: SHX6078

## 2015-10-16 LAB — TYPE AND SCREEN
ABO/RH(D): O NEG
ANTIBODY SCREEN: NEGATIVE

## 2015-10-16 SURGERY — SALPINGO-OOPHORECTOMY, BILATERAL, ROBOT-ASSISTED
Anesthesia: General | Site: Abdomen | Laterality: Bilateral

## 2015-10-16 MED ORDER — SODIUM CHLORIDE 0.9% FLUSH: 3.0000 mL | Freq: Two times a day (BID) | INTRAVENOUS | Status: DC

## 2015-10-16 MED ORDER — SUGAMMADEX SODIUM 200 MG/2ML IV SOLN
INTRAVENOUS | Status: AC
  Filled 2015-10-16: qty 2

## 2015-10-16 MED ORDER — LIDOCAINE HCL (CARDIAC) 20 MG/ML IV SOLN
INTRAVENOUS | Status: AC
  Filled 2015-10-16: qty 5

## 2015-10-16 MED ORDER — PROMETHAZINE HCL 25 MG/ML IJ SOLN: 6.2500 mg | INTRAMUSCULAR | Status: DC | PRN

## 2015-10-16 MED ORDER — ROCURONIUM BROMIDE 100 MG/10ML IV SOLN
INTRAVENOUS | Status: AC
Start: 2015-10-16 — End: 2015-10-16
  Filled 2015-10-16: qty 1

## 2015-10-16 MED ORDER — SCOPOLAMINE 1 MG/3DAYS TD PT72
1.0000 | MEDICATED_PATCH | TRANSDERMAL | Status: DC
  Administered 2015-10-16: 1 via TRANSDERMAL

## 2015-10-16 MED ORDER — LACTATED RINGERS IV SOLN
INTRAVENOUS | Status: DC | PRN
  Administered 2015-10-16 (×2): via INTRAVENOUS

## 2015-10-16 MED ORDER — ACETAMINOPHEN 650 MG RE SUPP
650.0000 mg | RECTAL | Status: DC | PRN
  Filled 2015-10-16: qty 1

## 2015-10-16 MED ORDER — PHENYLEPHRINE HCL 10 MG/ML IJ SOLN
10.0000 mg | INTRAVENOUS | Status: DC | PRN
  Administered 2015-10-16: 50 ug/min via INTRAVENOUS

## 2015-10-16 MED ORDER — LABETALOL HCL 5 MG/ML IV SOLN
INTRAVENOUS | Status: AC
  Filled 2015-10-16: qty 4

## 2015-10-16 MED ORDER — LABETALOL HCL 5 MG/ML IV SOLN
INTRAVENOUS | Status: DC | PRN
  Administered 2015-10-16: 5 mg via INTRAVENOUS

## 2015-10-16 MED ORDER — SCOPOLAMINE 1 MG/3DAYS TD PT72
MEDICATED_PATCH | TRANSDERMAL | Status: AC
  Filled 2015-10-16: qty 1

## 2015-10-16 MED ORDER — SUCCINYLCHOLINE CHLORIDE 20 MG/ML IJ SOLN
INTRAMUSCULAR | Status: DC | PRN
  Administered 2015-10-16: 160 mg via INTRAVENOUS

## 2015-10-16 MED ORDER — ACETAMINOPHEN 325 MG PO TABS: 650.0000 mg | ORAL_TABLET | ORAL | Status: DC | PRN

## 2015-10-16 MED ORDER — ONDANSETRON HCL 4 MG/2ML IJ SOLN
INTRAMUSCULAR | Status: DC | PRN
  Administered 2015-10-16: 4 mg via INTRAVENOUS

## 2015-10-16 MED ORDER — FENTANYL CITRATE (PF) 100 MCG/2ML IJ SOLN: 25.0000 ug | INTRAMUSCULAR | Status: DC | PRN

## 2015-10-16 MED ORDER — FENTANYL CITRATE (PF) 250 MCG/5ML IJ SOLN
INTRAMUSCULAR | Status: AC
  Filled 2015-10-16: qty 5

## 2015-10-16 MED ORDER — LACTATED RINGERS IV SOLN: INTRAVENOUS | Status: DC

## 2015-10-16 MED ORDER — SODIUM CHLORIDE 0.9 % IV SOLN
250.0000 mL | INTRAVENOUS | Status: DC | PRN
Start: 2015-10-16 — End: 2015-10-16

## 2015-10-16 MED ORDER — LACTATED RINGERS IR SOLN
Status: DC | PRN
  Administered 2015-10-16: 1000 mL

## 2015-10-16 MED ORDER — SODIUM CHLORIDE 0.9% FLUSH: 3.0000 mL | INTRAVENOUS | Status: DC | PRN

## 2015-10-16 MED ORDER — PROPOFOL 10 MG/ML IV BOLUS
INTRAVENOUS | Status: DC | PRN
  Administered 2015-10-16: 200 mg via INTRAVENOUS

## 2015-10-16 MED ORDER — HYDROCODONE-ACETAMINOPHEN 5-325 MG PO TABS: 1.0000 | ORAL_TABLET | ORAL | Status: DC | PRN

## 2015-10-16 MED ORDER — PROPOFOL 10 MG/ML IV BOLUS
INTRAVENOUS | Status: AC
  Filled 2015-10-16: qty 20

## 2015-10-16 MED ORDER — FENTANYL CITRATE (PF) 100 MCG/2ML IJ SOLN
INTRAMUSCULAR | Status: DC | PRN
  Administered 2015-10-16 (×7): 50 ug via INTRAVENOUS

## 2015-10-16 MED ORDER — SUGAMMADEX SODIUM 200 MG/2ML IV SOLN
INTRAVENOUS | Status: DC | PRN
  Administered 2015-10-16: 200 mg via INTRAVENOUS

## 2015-10-16 MED ORDER — ONDANSETRON HCL 4 MG/2ML IJ SOLN
INTRAMUSCULAR | Status: AC
Start: 2015-10-16 — End: 2015-10-16
  Filled 2015-10-16: qty 2

## 2015-10-16 MED ORDER — KETOROLAC TROMETHAMINE 30 MG/ML IJ SOLN
INTRAMUSCULAR | Status: AC
  Filled 2015-10-16: qty 1

## 2015-10-16 MED ORDER — ESMOLOL HCL 100 MG/10ML IV SOLN
INTRAVENOUS | Status: DC | PRN
  Administered 2015-10-16 (×3): 30 mg via INTRAVENOUS

## 2015-10-16 MED ORDER — MIDAZOLAM HCL 5 MG/5ML IJ SOLN
INTRAMUSCULAR | Status: DC | PRN
  Administered 2015-10-16: 2 mg via INTRAVENOUS

## 2015-10-16 MED ORDER — FENTANYL CITRATE (PF) 100 MCG/2ML IJ SOLN
INTRAMUSCULAR | Status: AC
  Filled 2015-10-16: qty 2

## 2015-10-16 MED ORDER — ACETAMINOPHEN 500 MG PO TABS
1000.0000 mg | ORAL_TABLET | Freq: Four times a day (QID) | ORAL | Status: DC
  Filled 2015-10-16 (×4): qty 2

## 2015-10-16 MED ORDER — DEXAMETHASONE SODIUM PHOSPHATE 10 MG/ML IJ SOLN
INTRAMUSCULAR | Status: DC | PRN
  Administered 2015-10-16: 10 mg via INTRAVENOUS

## 2015-10-16 MED ORDER — KETOROLAC TROMETHAMINE 15 MG/ML IJ SOLN: 15.0000 mg | Freq: Four times a day (QID) | INTRAMUSCULAR | Status: DC

## 2015-10-16 MED ORDER — HYDROCODONE-ACETAMINOPHEN 5-325 MG PO TABS: 1.0000 | ORAL_TABLET | Freq: Four times a day (QID) | ORAL | Status: DC | PRN

## 2015-10-16 MED ORDER — LIDOCAINE HCL (CARDIAC) 20 MG/ML IV SOLN
INTRAVENOUS | Status: DC | PRN
  Administered 2015-10-16: 100 mg via INTRAVENOUS

## 2015-10-16 MED ORDER — STERILE WATER FOR IRRIGATION IR SOLN
Status: DC | PRN
  Administered 2015-10-16: 1000 mL

## 2015-10-16 MED ORDER — MIDAZOLAM HCL 2 MG/2ML IJ SOLN
INTRAMUSCULAR | Status: AC
  Filled 2015-10-16: qty 2

## 2015-10-16 MED ORDER — HYDROMORPHONE HCL 2 MG/ML IJ SOLN
INTRAMUSCULAR | Status: AC
  Filled 2015-10-16: qty 1

## 2015-10-16 MED ORDER — ROCURONIUM BROMIDE 100 MG/10ML IV SOLN
INTRAVENOUS | Status: DC | PRN
  Administered 2015-10-16: 50 mg via INTRAVENOUS
  Administered 2015-10-16: 10 mg via INTRAVENOUS

## 2015-10-16 MED ORDER — PHENYLEPHRINE HCL 10 MG/ML IJ SOLN
INTRAMUSCULAR | Status: AC
  Filled 2015-10-16: qty 1

## 2015-10-16 MED ORDER — ARTIFICIAL TEARS OP OINT
TOPICAL_OINTMENT | OPHTHALMIC | Status: DC | PRN
  Administered 2015-10-16: 1 via OPHTHALMIC

## 2015-10-16 SURGICAL SUPPLY — 50 items
BAG LAPAROSCOPIC 12 15 PORT 16 (BASKET) IMPLANT
BAG RETRIEVAL 12/15 (BASKET) ×2
BAG RETRIEVAL 12/15MM (BASKET) ×1
BAG SPEC RTRVL LRG 6X4 10 (ENDOMECHANICALS) ×1
CHLORAPREP W/TINT 26ML (MISCELLANEOUS) ×3 IMPLANT
COVER SURGICAL LIGHT HANDLE (MISCELLANEOUS) ×3 IMPLANT
COVER TIP SHEARS 8 DVNC (MISCELLANEOUS) ×1 IMPLANT
COVER TIP SHEARS 8MM DA VINCI (MISCELLANEOUS) ×2
DRAPE ARM DVNC X/XI (DISPOSABLE) ×4 IMPLANT
DRAPE COLUMN DVNC XI (DISPOSABLE) ×1 IMPLANT
DRAPE DA VINCI XI ARM (DISPOSABLE) ×8
DRAPE DA VINCI XI COLUMN (DISPOSABLE) ×2
DRAPE SHEET LG 3/4 BI-LAMINATE (DRAPES) ×6 IMPLANT
DRAPE SURG IRRIG POUCH 19X23 (DRAPES) ×3 IMPLANT
ELECT REM PT RETURN 9FT ADLT (ELECTROSURGICAL) ×3
ELECTRODE REM PT RTRN 9FT ADLT (ELECTROSURGICAL) ×1 IMPLANT
GLOVE BIO SURGEON STRL SZ 6 (GLOVE) ×12 IMPLANT
GLOVE BIO SURGEON STRL SZ 6.5 (GLOVE) ×4 IMPLANT
GLOVE BIO SURGEONS STRL SZ 6.5 (GLOVE) ×2
GOWN STRL REUS W/ TWL LRG LVL3 (GOWN DISPOSABLE) ×3 IMPLANT
GOWN STRL REUS W/TWL LRG LVL3 (GOWN DISPOSABLE) ×9
HOLDER FOLEY CATH W/STRAP (MISCELLANEOUS) ×3 IMPLANT
KIT BASIN OR (CUSTOM PROCEDURE TRAY) ×3 IMPLANT
LIQUID BAND (GAUZE/BANDAGES/DRESSINGS) ×3 IMPLANT
MANIPULATOR UTERINE 4.5 ZUMI (MISCELLANEOUS) IMPLANT
MARKER SKIN DUAL TIP RULER LAB (MISCELLANEOUS) ×3 IMPLANT
OBTURATOR XI 8MM BLADELESS (TROCAR) ×3 IMPLANT
OCCLUDER COLPOPNEUMO (BALLOONS) IMPLANT
PAD POSITIONING PINK XL (MISCELLANEOUS) ×3 IMPLANT
PORT ACCESS TROCAR AIRSEAL 12 (TROCAR) ×1 IMPLANT
PORT ACCESS TROCAR AIRSEAL 5M (TROCAR) ×2
POUCH SPECIMEN RETRIEVAL 10MM (ENDOMECHANICALS) ×2 IMPLANT
SEAL CANN UNIV 5-8 DVNC XI (MISCELLANEOUS) ×4 IMPLANT
SEAL XI 5MM-8MM UNIVERSAL (MISCELLANEOUS) ×8
SET TRI-LUMEN FLTR TB AIRSEAL (TUBING) IMPLANT
SET TUBE IRRIG SUCTION NO TIP (IRRIGATION / IRRIGATOR) ×3 IMPLANT
SHEET LAVH (DRAPES) ×3 IMPLANT
SOLUTION ELECTROLUBE (MISCELLANEOUS) ×3 IMPLANT
SUT MNCRL AB 4-0 PS2 18 (SUTURE) ×6 IMPLANT
SUT VIC AB 0 CT1 27 (SUTURE)
SUT VIC AB 0 CT1 27XBRD ANTBC (SUTURE) IMPLANT
SYR 50ML LL SCALE MARK (SYRINGE) IMPLANT
TOWEL OR 17X26 10 PK STRL BLUE (TOWEL DISPOSABLE) ×6 IMPLANT
TOWEL OR NON WOVEN STRL DISP B (DISPOSABLE) ×3 IMPLANT
TRAP SPECIMEN MUCOUS 40CC (MISCELLANEOUS) IMPLANT
TRAY FOLEY W/METER SILVER 14FR (SET/KITS/TRAYS/PACK) ×3 IMPLANT
TRAY LAPAROSCOPIC (CUSTOM PROCEDURE TRAY) ×3 IMPLANT
TROCAR BLADELESS OPT 5 100 (ENDOMECHANICALS) ×3 IMPLANT
UNDERPAD 30X30 INCONTINENT (UNDERPADS AND DIAPERS) ×3 IMPLANT
WATER STERILE IRR 1500ML POUR (IV SOLUTION) ×3 IMPLANT

## 2015-10-16 NOTE — Transfer of Care (Signed)
Immediate Anesthesia Transfer of Care Note  Patient: Jennifer Park  Procedure(s) Performed: Procedure(s): XI ROBOTIC ASSISTED BILATERAL SALPINGO OOPHORECTOMY  (Bilateral)  Patient Location: PACU  Anesthesia Type:General  Level of Consciousness:  sedated, patient cooperative and responds to stimulation  Airway & Oxygen Therapy:Patient Spontanous Breathing and Patient connected to face mask oxgen  Post-op Assessment:  Report given to PACU RN and Post -op Vital signs reviewed and stable  Post vital signs:  Reviewed and stable  Last Vitals:  Filed Vitals:   10/16/15 0817  BP: 125/85  Pulse: 72  Temp: 36.4 C  Resp: 18    Complications: No apparent anesthesia complications

## 2015-10-16 NOTE — Interval H&P Note (Signed)
History and Physical Interval Note:  10/16/2015 10:12 AM  Jennifer Park  has presented today for surgery, with the diagnosis of ovarian mass  The various methods of treatment have been discussed with the patient and family. After consideration of risks, benefits and other options for treatment, the patient has consented to  Procedure(s): XI ROBOTIC ASSISTED BILATERAL SALPINGO OOPHORECTOMY AND POSSIBLE STAGING (Bilateral) as a surgical intervention .  The patient's history has been reviewed, patient examined, no change in status, stable for surgery.  I have reviewed the patient's chart and labs.  Questions were answered to the patient's satisfaction.     Donaciano Eva

## 2015-10-16 NOTE — Discharge Instructions (Signed)
Return to work: 2 weeks  Activity: 1. Be up and out of the bed during the day.  Take a nap if needed.  You may walk up steps but be careful and use the hand rail.  Stair climbing will tire you more than you think, you may need to stop part way and rest.   2. No lifting or straining for 6 weeks.  3. No driving for 1-2 weeks.  Do Not drive if you are taking narcotic pain medicine.  4. Shower daily.  Use soap and water on your incision and pat dry; don't rub.   5. No sexual activity and nothing in the vagina for 4 weeks.  Diet: 1. Low sodium Heart Healthy Diet is recommended.  2. It is safe to use a laxative if you have difficulty moving your bowels.   Wound Care: 1. Keep clean and dry.  Shower daily.  Reasons to call the Doctor:   Fever - Oral temperature greater than 100.4 degrees Fahrenheit  Foul-smelling vaginal discharge  Difficulty urinating  Nausea and vomiting  Increased pain at the site of the incision that is unrelieved with pain medicine.  Difficulty breathing with or without chest pain  New calf pain especially if only on one side  Sudden, continuing increased vaginal bleeding with or without clots.   Follow-up: 1. See Everitt Amber in 4 weeks.  Contacts: For questions or concerns you should contact:  Dr. Everitt Amber at [phone]  or at [place]  Bilateral Salpingo-Oophorectomy, Care After Refer to this sheet in the next few weeks. These instructions provide you with information on caring for yourself after your procedure. Your health care provider may also give you more specific instructions. Your treatment has been planned according to current medical practices, but problems sometimes occur. Call your health care provider if you have any problems or questions after your procedure. WHAT TO EXPECT AFTER THE PROCEDURE After your procedure, it is typical to have the following:   Abdominal pain that can be controlled with medicine.  Vaginal  spotting.  Constipation.  Menopausal symptoms such as hot flashes, vaginal dryness, and mood swings. HOME CARE INSTRUCTIONS   Get plenty of rest and sleep.  Only take over-the-counter or prescription medicines as directed by your health care provider. Do not take aspirin. It can cause bleeding.  Keep incision areas clean and dry. Remove or change bandages (dressings) only as directed by your health care provider.  Take showers instead of baths for a few weeks as directed by your health care provider.  Limit exercise and activities as directed by your health care provider. Do not lift anything heavier than 5 pounds (2.3 kg) until your health care provider approves.  Do not drive until your health care provider approves.  Follow your health care provider's advice regarding diet. You may be able to resume your usual diet right away.  Drink enough fluids to keep your urine clear or pale yellow.  Do not douche, use tampons, or have sexual intercourse for 6 weeks after the procedure.  Do not drink alcohol until your health care provider says it is okay.  Take your temperature twice a day and write it down.  If you become constipated, you may:  Ask your health care provider about taking a mild laxative.  Add more fruit and bran to your diet.  Drink more fluids.  Follow up with your health care provider as directed. SEEK MEDICAL CARE IF:   You have swelling, redness, or  increasing pain in the incision area.  You see pus coming from the incision area.  You notice a bad smell coming from the wound or dressing.  You have pain, redness, or swelling where the IV access tube was placed.  Your incision is breaking open (the edges are not staying together).  You feel dizzy or feel like fainting.  You develop pain or bleeding when you urinate.  You develop diarrhea.  You develop nausea and vomiting.  You develop abnormal vaginal discharge.  You develop a rash.  You have  pain that is not controlled with medicine. SEEK IMMEDIATE MEDICAL CARE IF:   You develop a fever.  You develop abdominal pain.  You have chest pain.  You develop shortness of breath.  You pass out.  You develop pain, swelling, or redness in your leg.  You develop heavy vaginal bleeding with or without blood clots.   This information is not intended to replace advice given to you by your health care provider. Make sure you discuss any questions you have with your health care provider.   Document Released: 03/17/2005 Document Revised: 11/17/2012 Document Reviewed: 09/08/2012 Elsevier Interactive Patient Education [employer].

## 2015-10-16 NOTE — Anesthesia Procedure Notes (Signed)
Procedure Name: Intubation Date/Time: 10/16/2015 10:48 AM Performed by: Montel Clock Pre-anesthesia Checklist: Patient identified, Emergency Drugs available, Suction available, Patient being monitored and Timeout performed Patient Re-evaluated:Patient Re-evaluated prior to inductionOxygen Delivery Method: Circle system utilized Preoxygenation: Pre-oxygenation with 100% oxygen Intubation Type: IV induction Ventilation: Mask ventilation without difficulty and Oral airway inserted - appropriate to patient size Laryngoscope Size: Mac and 3 Grade View: Grade II Tube type: Oral Tube size: 7.5 mm Number of attempts: 1 Airway Equipment and Method: Stylet Placement Confirmation: ETT inserted through vocal cords under direct vision,  positive ETCO2 and breath sounds checked- equal and bilateral Secured at: 21 cm Tube secured with: Tape Dental Injury: Teeth and Oropharynx as per pre-operative assessment  Comments:  Attempt x 1 by EMT student, Miller 2, grade 4 view.  CRNA attempt x1, MAC 3 Grade 1/2 view. ETT easily passed through.

## 2015-10-16 NOTE — Anesthesia Postprocedure Evaluation (Signed)
Anesthesia Post Note  Patient: Jennifer Park  Procedure(s) Performed: Procedure(s) (LRB): XI ROBOTIC ASSISTED BILATERAL SALPINGO OOPHORECTOMY  (Bilateral)  Patient location during evaluation: PACU Anesthesia Type: General Level of consciousness: awake and alert Pain management: pain level controlled Vital Signs Assessment: post-procedure vital signs reviewed and stable Respiratory status: spontaneous breathing, nonlabored ventilation, respiratory function stable and patient connected to nasal cannula oxygen Cardiovascular status: blood pressure returned to baseline and stable Postop Assessment: no signs of nausea or vomiting Anesthetic complications: no    Last Vitals:  Filed Vitals:   10/16/15 1300 10/16/15 1325  BP: 129/78 130/69  Pulse: 63 65  Temp: 36.8 C 37 C  Resp: 11 16    Last Pain:  Filed Vitals:   10/16/15 1337  PainSc: 3                  Adele Milson,JAMES TERRILL

## 2015-10-16 NOTE — H&P (View-Only) (Signed)
Consult Note: Gyn-Onc  Consult was requested by Dr. Jana Hakim for the evaluation of Jennifer Park 55 y.o. female  CC:  Chief Complaint  Patient presents with  . Adnexal mass    New Consultation    Assessment/Plan:  Ms. Jennifer Park  is a 55 y.o.  year old with a 12cm mostly cystic left ovarian cyst, obesity (BMI 63) and a history of bilateral DCIS s/p mastectomy. She is s/p hysterectomy in 2009 for CAH.  I reviewed the patient's CT images from 09/14/15 with the patient. I discussed that I have a low suspicion for malignancy based on its uniform appearance, mostly cystic composition and her normal CA 125 (12). However, given that it is (possibly) mildly symptomatic, her postmenopausal age, and her anxiety about a potential underlying malignancy, and her acceptable surgical risk profile, we have decided to proceed with surgical excision (robotic assisted BSO, possible staging).  I discussed operative risks including  bleeding, infection, damage to internal organs (such as bladder,ureters, bowels), blood clot, reoperation and rehospitalization. I discussed that her underlying obesity increases her risk for these complications.  I discussed the anticipated hospital stay (outpatient) and postoperative recovery and restrictions.  HPI: Jennifer Park is a 55 year old G1P1 who is seen in consultation at the request of Dr Jana Hakim for a left ovarian mass. The patient has a remote history of endometrial complex hyperplasia with atypia in 2009 treated with laparoscopic total hysterectomy (no BSO as she was premenopausal).  She subsequently developed ER/PR positive DCIS treated with bilateral mastectomies in December 2016.   In June, 2017 she developed an episode of sharp lower abdominal pain and was seen in the ER at [facility] on 09/04/15. The CT scan showed a 12x6x7.7cm left ovarian cystic mass arising from the left ovary and a 73m nodule on the right posterior lobe that was indeterminate.   A transvaginal  UKore[place]as performed that showed cyst with 2 nodules one measuring 2.3x1.2x1cm and hte other measuring 1.6x0.5x0.8cm). CA 125 was drawn and was nromal at 12.2U/mL.  The patient's pain has substantially improved in the past month. She takes naprosyn for this.  She is obese with a BMI of 42 and she has had 2 prior surgeries (one cesarean and one laparoscopic hysterectomy).  Ms JJenninghas a mother with a history of premenopausal breast cancer. No family history of ovarian cancer. She has not undergone BRCA testing.  Current Meds:  Outpatient Encounter Prescriptions as of 10/01/2015  Medication Sig  . aspirin-acetaminophen-caffeine (EXCEDRIN MIGRAINE) 250-250-65 MG tablet Take 2 tablets by mouth every 6 (six) hours as needed for headache.  . cetirizine (ZYRTEC) 10 MG tablet Take 10 mg by mouth daily.  . fluticasone (FLONASE) 50 MCG/ACT nasal spray Place 1 spray into both nostrils daily as needed.  . furosemide (LASIX) 20 MG tablet Take 1 tablet (20 mg total) by mouth daily as needed for fluid or edema.  . naproxen (NAPROSYN) 500 MG tablet Take 1 tablet (500 mg total) by mouth 2 (two) times daily.  .Marland Kitchenomeprazole (PRILOSEC) 20 MG capsule Take 1 capsule (20 mg total) by mouth daily.  . sertraline (ZOLOFT) 50 MG tablet Take 1 tablet (50 mg total) by mouth daily.  .Marland KitchenHYDROcodone-acetaminophen (NORCO) 5-325 MG tablet Take 1 tablet by mouth every 6 (six) hours as needed for moderate pain. (Patient not taking: Reported on 10/01/2015)  . ondansetron (ZOFRAN ODT) 4 MG disintegrating tablet Take 1 tablet (4 mg total) by mouth every 8 (eight) hours as  needed for nausea or vomiting. (Patient not taking: Reported on 10/01/2015)   No facility-administered encounter medications on file as of 10/01/2015.    Allergy:  Allergies  Allergen Reactions  . Codeine Nausea Only  . Morphine Nausea And Vomiting    REACTION: vomiting     Social Hx:   Social History   Social History  . Marital Status: Married    Spouse Name:  N/A  . Number of Children: N/A  . Years of Education: N/A   Occupational History  . Not on file.   Social History Main Topics  . Smoking status: Former Smoker    Quit date: 03/31/1984  . Smokeless tobacco: Never Used     Comment: age [age] , up to 1 ppd  . Alcohol Use: 0.6 oz/week    1 Glasses of wine per week     Comment: OCCASIONAL  . Drug Use: No  . Sexual Activity: Not on file   Other Topics Concern  . Not on file   Social History Narrative    Past Surgical Hx:  Past Surgical History  Procedure Laterality Date  . Ganglion cyst excision    . Tonsillectomy    . No colonoscopy      11/17/13 SOC reviewed  . Varicose vein surgery      as OP X2  . Cesarean section    . Mastectomy w/ sentinel node biopsy Bilateral 03/14/2015  . Tonsillectomy    . Mastectomy w/ sentinel node biopsy Bilateral 03/14/2015    Procedure: BILATERAL MASTECTOMY WITH LEFT SENTINEL LYMPH NODE BIOPSY;  Surgeon: [name], MD;  Location: [place];  Service: General;  Laterality: Bilateral;  . Abdominal hysterectomy      for abnormal cytology; Dr Irven [place], ovaries left    Past Medical Hx:  Past Medical History  Diagnosis Date  . Anxiety     situational  . Hyperlipidemia   . Allergy   . Breast cancer of lower-outer quadrant of left female breast ([place]) 01/12/2015  . Claustrophobia   . Depression   . PONV (postoperative nausea and vomiting)     wasn't sure if it was related to morphine or epidural  . GERD (gastroesophageal reflux disease)   . Anemia     history of anemia 5 yrs.  . Claustrophobia   . Asthma     MILD / RARE    Past Gynecological History:  G1P1 (cesarean), CAH s/p hysterectomy  No LMP recorded. Patient has had a hysterectomy.  Family Hx:  Family History  Problem Relation Age of Onset  . Bipolar disorder Mother   . Breast cancer Mother   . Cancer Mother   . Diabetes Father   . Kidney failure Father   . Heart failure Father   . COPD Father   . Heart disease Father    . Hyperlipidemia Father   . Bipolar disorder Sister   . Heart attack Maternal Grandfather     in late 64s  . Stroke Maternal Grandmother     in 90s    Review of Systems:  Constitutional  Feels well,    ENT Normal appearing ears and nares bilaterally Skin/Breast  No rash, sores, jaundice, itching, dryness Cardiovascular  No chest pain, shortness of breath, or edema  Pulmonary  No cough or wheeze.  Gastro Intestinal  No nausea, vomitting, or diarrhoea. No bright red blood per rectum, no abdominal pain, change in bowel movement, or constipation.  Genito Urinary  No frequency, urgency, dysuria, occasional pelvic  pain Musculo Skeletal  No myalgia, arthralgia, joint swelling or pain  Neurologic  No weakness, numbness, change in gait,  Psychology  No depression, anxiety, insomnia.   Vitals:  Blood pressure 138/85, pulse 69, temperature 98 F (36.7 C), temperature source Oral, resp. rate 17, height '5\' 8"'  (1.727 m), weight 277 lb 6.4 oz (125.828 kg), SpO2 97 %.  Physical Exam: WD in NAD Neck  Supple NROM, without any enlargements.  Lymph Node Survey No cervical supraclavicular or inguinal adenopathy Cardiovascular  Pulse normal rate, regularity and rhythm. S1 and S2 normal.  Lungs  Clear to auscultation bilateraly, without wheezes/crackles/rhonchi. Good air movement.  Skin  No rash/lesions/breakdown  Psychiatry  Alert and oriented to person, place, and time  Abdomen  Normoactive bowel sounds, abdomen soft, non-tender and obese without evidence of hernia.  Back No CVA tenderness Genito Urinary  Vulva/vagina: Normal external female genitalia.   No lesions. No discharge or bleeding.  Bladder/urethra:  No lesions or masses, well supported bladder  Vagina: normal  Cervix and Uterus: surgically absent   Adnexa: unable to appreciate mass. Rectal  Good tone, no masses no cul de sac nodularity.  Extremities  No bilateral cyanosis, clubbing or edema.   Donaciano Eva, MD  10/01/2015, 10:51 AM

## 2015-10-16 NOTE — Op Note (Signed)
OPERATIVE NOTE  Date: 10/16/15  Preoperative Diagnosis: left ovarian cyst   Postoperative Diagnosis:  Same, benign  Procedure(s) Performed: Robotic-assisted laparoscopic bilateral salpingo-oophorectomy  Surgeon: Everitt Amber, M.D.  Assistant Surgeon: Lahoma Crocker M.D. (an MD assistant was necessary for tissue manipulation, management of robotic instrumentation, retraction and positioning due to the complexity of the case and hospital policies).   Anesthesia: Gen. endotracheal.  Specimens: Bilateral ovaries, fallopian tubes, pelvic washings  Estimated Blood Loss: <20 mL. Blood Replacement: None  Complications: none  Indication for Procedure:  12cm left ovarian cyst, normal CA125. Morbid obesity  Operative Findings: morbid obesity, 12cm bilobed cyst from left ovary unavoidably ruptured during manipulation. Sigmoid epiploica adherent to cyst. Normal right tube and ovary.  Frozen pathology was consistent with benign cystadenoma.  Procedure: The patient's taken to the operating room and placed under general endotracheal anesthesia testing difficulty. She is placed in a dorsolithotomy position and cervical acromial pad was placed. The arms were tucked with care taken to pad the olecranon process. And prepped and draped in usual sterile fashion. A uterine manipulator (zumi) was placed vaginally. A 80mm incision was made in the left upper quadrant palmer's point and a 5 mm Optiview trocar used to enter the abdomen under direct visualization. With entry into the abdomen and then maintenance of 15 mm of mercury the patient was placed in Trendelenburg position. An incision was made in the umbilicus and a 69mm trochar was placed through this site. Two incisions were made lateral to the umbilical incision in the left and right abdomen measuring 70mm. These incisions were made approximately 10 cm lateral to the umbilical incision. 8 mm robotic trochars were inserted. The robot was docked.  The  abdomen was inspected as was the pelvis.  Pelvic washings were obtained.   The sigmoid adhesions were taken down sharply from their attachments to the cyst.  The left peritoneum and the side wall was incised, and the retroperitoneal space entered. The left ureter was identified and the left pararectal space was developed. The utero-ovarian ligament was skeletonized cauterized and transected. The left ligamentous attachments to the vaginal cuff were cauterized and transected in the left adnexa was placed in an Endo Catch bag.    An incision was made on the right pelvic side wall peritoneum parallel to the IP ligament and the retroperitoneal space entered. The right ureter was identified and the para-rectal space was developed. A window was created in the right broad ligament above the ureter. The right infundibulopelvic vessels were skeletonized cauterized and transected. The vaginal attachments and peritoneal attachments were similarly cauterized and transected. Specimen was placed in an Endo Catch bag.  The abdomen was copiously irrigated and drained and all operative sites inspected and hemostasis was assured.  The robot was undocked. The contents of the left Endo Catch bag were first aspirated and then morcellated to facilitate removal from the abdominal cavity through the LUQ incision. In a similar fashion the contents of the right Endo Catch bag or morcellated to facilitate removal from the abdominal cavity.  The ports were all remove. The fascial closure at the umbilical incision and left upper quadrant port was made with 0 Vicryl.  All incisions were closed with a running subcuticular Monocryl suture. Dermabond was applied. Sponge, lap and needle counts were correct x 3.    The patient had sequential compression devices for VTE prophylaxis.         Disposition: PACU          Condition: stable  Donaciano Eva, MD

## 2015-10-16 NOTE — Anesthesia Preprocedure Evaluation (Addendum)
Anesthesia Evaluation  Patient identified by MRN, date of birth, ID band Patient awake    Reviewed: Allergy & Precautions, NPO status , Patient's Chart, lab work & pertinent test results  History of Anesthesia Complications (+) PONV and history of anesthetic complications  Airway Mallampati: II  TM Distance: <3 FB Neck ROM: Full    Dental  (+) Teeth Intact, Dental Advisory Given, Missing   Pulmonary asthma , former smoker,    Pulmonary exam normal breath sounds clear to auscultation       Cardiovascular hypertension (no medications currently), Normal cardiovascular exam Rhythm:Regular Rate:Normal     Neuro/Psych PSYCHIATRIC DISORDERS Anxiety Depression negative neurological ROS     GI/Hepatic Neg liver ROS, GERD  Medicated and Controlled,  Endo/Other  Morbid obesity  Renal/GU negative Renal ROS     Musculoskeletal  (+) Arthritis ,   Abdominal   Peds  Hematology negative hematology ROS (+)   Anesthesia Other Findings Day of surgery medications reviewed with the patient.  Breast cancer s/p bilateral mastectomy  Reproductive/Obstetrics Ovarian mass                          Anesthesia Physical Anesthesia Plan  ASA: III  Anesthesia Plan: General   Post-op Pain Management:    Induction: Intravenous  Airway Management Planned: Oral ETT  Additional Equipment:   Intra-op Plan:   Post-operative Plan: Extubation in OR  Informed Consent: I have reviewed the patients History and Physical, chart, labs and discussed the procedure including the risks, benefits and alternatives for the proposed anesthesia with the patient or authorized representative who has indicated his/her understanding and acceptance.   Dental advisory given  Plan Discussed with: CRNA  Anesthesia Plan Comments: (Risks/benefits of general anesthesia discussed with patient including risk of damage to teeth, lips, gum,  and tongue, nausea/vomiting, allergic reactions to medications, and the possibility of heart attack, stroke and death.  All patient questions answered.  Patient wishes to proceed. )       Anesthesia Quick Evaluation

## 2015-10-18 ENCOUNTER — Telehealth: Payer: Self-pay

## 2015-10-18 NOTE — Telephone Encounter (Signed)
Orders received from [place] to contact the patient to update with surgical pathology report being "Negative"cancer , also to see how she is doing postoperatively. Attempted to contact the patient both on her land line and cell . No answer , left a detailed message on both phones with call back requested, our contact information was given.

## 2015-11-14 ENCOUNTER — Ambulatory Visit: Payer: Managed Care, Other (non HMO) | Attending: Gynecologic Oncology | Admitting: Gynecologic Oncology

## 2015-11-14 ENCOUNTER — Encounter: Payer: Self-pay | Admitting: Gynecologic Oncology

## 2015-11-14 VITALS — BP 134/84 | HR 87 | Temp 99.1°F | Resp 18 | Ht 68.0 in | Wt 275.4 lb

## 2015-11-14 DIAGNOSIS — Z90710 Acquired absence of both cervix and uterus: Secondary | ICD-10-CM | POA: Diagnosis not present

## 2015-11-14 DIAGNOSIS — N83202 Unspecified ovarian cyst, left side: Secondary | ICD-10-CM | POA: Diagnosis not present

## 2015-11-14 DIAGNOSIS — Z90722 Acquired absence of ovaries, bilateral: Secondary | ICD-10-CM | POA: Diagnosis not present

## 2015-11-14 DIAGNOSIS — Z853 Personal history of malignant neoplasm of breast: Secondary | ICD-10-CM | POA: Insufficient documentation

## 2015-11-14 DIAGNOSIS — Z9889 Other specified postprocedural states: Secondary | ICD-10-CM | POA: Diagnosis not present

## 2015-11-14 NOTE — Patient Instructions (Signed)
No follow up necessary with Dr. Rossi.  Please call for any questions or concerns. 

## 2015-11-14 NOTE — Progress Notes (Signed)
POSTOPERATIVE FOLLOW-UP  HPI:  Jennifer Park is a 54 y.o. year old No obstetric history on file. initially seen in consultation on 10/01/15 referred by Dr Jana Hakim for a large left ovarian cyst.  She then underwent a robotic assisted BSO (she had had a prior hysterectomy) on [id number] without complications.  Her postoperative course was uncomplicated.  Her final pathology revealed a benign seromucinous cystadenoma of the left ovary and a pathologically unremarkable right tube and ovary.  She is seen today for a postoperative check and to discuss her pathology results and ongoing plan.  Since discharge from the hospital, she is feeling well.  She has improving appetite, normal bowel and bladder function, and pain controlled with minimal PO medication. She has no other complaints today.    Review of systems: Constitutional:  She has no weight gain or weight loss. She has no fever or chills. Eyes: No blurred vision Ears, Nose, Mouth, Throat: No dizziness, headaches or changes in hearing. No mouth sores. Cardiovascular: No chest pain, palpitations or edema. Respiratory:  No shortness of breath, wheezing or cough Gastrointestinal: She has normal bowel movements without diarrhea or constipation. She denies any nausea or vomiting. She denies blood in her stool or heart burn. Genitourinary:  She denies pelvic pain, pelvic pressure or changes in her urinary function. She has no hematuria, dysuria, or incontinence. She has no irregular vaginal bleeding or vaginal discharge Musculoskeletal: Denies muscle weakness or joint pains.  Skin:  She has no skin changes, rashes or itching Neurological:  Denies dizziness or headaches. No neuropathy, no numbness or tingling. Psychiatric:  She denies depression or anxiety. Hematologic/Lymphatic:   No easy bruising or bleeding   Physical Exam: Blood pressure 134/84, pulse 87, temperature 99.1 F (37.3 C), temperature source Oral, resp. rate 18, height 5\' 8"  (1.727 m),  weight 275 lb 6.4 oz (124.9 kg), SpO2 95 %. General: Well dressed, well nourished in no apparent distress.   Abdomen:  Soft, nontender, nondistended.  No palpable masses.  No hepatosplenomegaly.  No ascites. Normal bowel sounds.  No hernias.  Incisions are well healed Genitourinary: deferred Extremities: No cyanosis, clubbing or edema.  No calf tenderness or erythema. No palpable cords. Psychiatric: Mood and affect are appropriate. Neurological: Awake, alert and oriented x 3. Sensation is intact, no neuropathy.  Musculoskeletal: No pain, normal strength and range of motion.  Assessment:    55 y.o. year old with a history of a benign left ovarian 12cm mass and a remote history of breast cancer.   S/p robotic assisted BSO on 10/11/15.   Plan: 1) Pathology reports reviewed today 2) Treatment counseling - I discussed the benign nature of this pathology. She does not require ongoing treatment for this. She was given the opportunity to ask questions, which were answered to her satisfaction, and she is agreement with the above mentioned plan of care.  3)  Return to clinic on a prn basis only. She will return to [facility] for primary care.  Donaciano Eva, MD

## 2016-04-15 ENCOUNTER — Ambulatory Visit: Payer: Managed Care, Other (non HMO) | Admitting: Oncology

## 2016-05-29 ENCOUNTER — Other Ambulatory Visit: Payer: Self-pay | Admitting: Internal Medicine

## 2016-08-06 ENCOUNTER — Encounter: Payer: Self-pay | Admitting: Internal Medicine

## 2016-08-06 ENCOUNTER — Ambulatory Visit (INDEPENDENT_AMBULATORY_CARE_PROVIDER_SITE_OTHER): Payer: Managed Care, Other (non HMO) | Admitting: Internal Medicine

## 2016-08-06 VITALS — BP 130/84 | HR 87 | Temp 98.0°F | Resp 16 | Wt 275.0 lb

## 2016-08-06 DIAGNOSIS — Z0000 Encounter for general adult medical examination without abnormal findings: Secondary | ICD-10-CM

## 2016-08-06 DIAGNOSIS — R0602 Shortness of breath: Secondary | ICD-10-CM | POA: Diagnosis not present

## 2016-08-06 DIAGNOSIS — J4520 Mild intermittent asthma, uncomplicated: Secondary | ICD-10-CM | POA: Diagnosis not present

## 2016-08-06 DIAGNOSIS — K219 Gastro-esophageal reflux disease without esophagitis: Secondary | ICD-10-CM

## 2016-08-06 DIAGNOSIS — J45991 Cough variant asthma: Secondary | ICD-10-CM | POA: Insufficient documentation

## 2016-08-06 DIAGNOSIS — R600 Localized edema: Secondary | ICD-10-CM

## 2016-08-06 DIAGNOSIS — Z1211 Encounter for screening for malignant neoplasm of colon: Secondary | ICD-10-CM

## 2016-08-06 DIAGNOSIS — R5382 Chronic fatigue, unspecified: Secondary | ICD-10-CM | POA: Insufficient documentation

## 2016-08-06 DIAGNOSIS — M2550 Pain in unspecified joint: Secondary | ICD-10-CM

## 2016-08-06 DIAGNOSIS — R195 Other fecal abnormalities: Secondary | ICD-10-CM | POA: Diagnosis not present

## 2016-08-06 DIAGNOSIS — F419 Anxiety disorder, unspecified: Secondary | ICD-10-CM

## 2016-08-06 DIAGNOSIS — R739 Hyperglycemia, unspecified: Secondary | ICD-10-CM | POA: Diagnosis not present

## 2016-08-06 DIAGNOSIS — E7800 Pure hypercholesterolemia, unspecified: Secondary | ICD-10-CM

## 2016-08-06 DIAGNOSIS — R0609 Other forms of dyspnea: Secondary | ICD-10-CM

## 2016-08-06 MED ORDER — OMEPRAZOLE 20 MG PO CPDR: 20.0000 mg | DELAYED_RELEASE_CAPSULE | Freq: Every day | ORAL | 3 refills | Status: DC

## 2016-08-06 MED ORDER — BUDESONIDE-FORMOTEROL FUMARATE 80-4.5 MCG/ACT IN AERO: 2.0000 | INHALATION_SPRAY | Freq: Two times a day (BID) | RESPIRATORY_TRACT | 12 refills | Status: DC

## 2016-08-06 MED ORDER — FUROSEMIDE 20 MG PO TABS: 20.0000 mg | ORAL_TABLET | Freq: Every day | ORAL | 3 refills | Status: DC | PRN

## 2016-08-06 MED ORDER — SERTRALINE HCL 50 MG PO TABS: 50.0000 mg | ORAL_TABLET | Freq: Every day | ORAL | 1 refills | Status: DC

## 2016-08-06 NOTE — Assessment & Plan Note (Signed)
Chronic, daily Never had a colonoscopy - referred today Check celiac screen May need to see an allergist for allergy testing

## 2016-08-06 NOTE — Assessment & Plan Note (Signed)
Check labs Further evaluation of sleep apnea depending on labs

## 2016-08-06 NOTE — Assessment & Plan Note (Signed)
Related to varicose veins Taking lasix

## 2016-08-06 NOTE — Assessment & Plan Note (Signed)
Controlled, stable Continue current dose of medication  

## 2016-08-06 NOTE — Assessment & Plan Note (Signed)
Uses albuterol as needed - not sure if it helps Will try symbicort to see if that helps Consider pulmonary referral

## 2016-08-06 NOTE — Progress Notes (Signed)
Subjective:    Patient ID: Jennifer Park, female    DOB: Jul 11, 1960, 56 y.o.   MRN: [id number]  HPI She is here for a physical exam.   She is having difficulty breathing.  It started a couple of years and has gotten worse.  She thought it was related to her last two big surgeries.  January 2018 she ran most of a 10K - she walked part of it, but did good.  She does not think her difficulty breathing is from being out of shape.  Her dad died of COPD and CHF.  She is out of breath when she lays down and if she moves.  She occasionally has SOB at rest.  She was diagnosed with mild asthma with Dr Harold Hedge.  She was using albuterol and stopped it because she did not think it was working, but tried it again recently.  She is unsure if it helps.  She has difficulty taking a full deep breath.    She is achy all over all the time.  More in the ankles, feet, knees, hips, hands, wrists, forearms, elbows all ache.  After sitting she is very stiff.  Once she gets moving she is better.    She is very fatigued.  She is exhausted.  She sleeps about 8 hrs but gets up 3 times at night.  She is able to get back to sleep.  She snores.  She denies gasping for air.  She occasionally has headaches in the morning, but not regularly.  Her husband denies apnea.    She walks regularly - 2 miles a day.  She denies SOB out of porportion to her exercise.   She has varicose veins in her legs.    She does not have a solid stool.  She eliminating dairy and it did not help.  She has loose stools every day.  She wonders about a food allergy or sensitivity.    Medications and allergies reviewed with patient and updated if appropriate.  Patient Active Problem List   Diagnosis Date Noted  . Hyperglycemia 08/06/2016  . Left ovarian cyst 10/01/2015  . GERD (gastroesophageal reflux disease) 05/23/2015  . DCIS (ductal carcinoma in situ) 03/14/2015  . Breast cancer of lower-outer quadrant of left female breast ([place]) 01/12/2015   . Adhesive capsulitis 11/23/2013  . Subacromial bursitis 11/23/2013  . Varicose veins 11/17/2013  . Edema 11/17/2013  . Hyperlipidemia 04/16/2010  . Elevated blood pressure reading without diagnosis of hypertension 04/16/2010  . Anxiety 12/15/2006  . DYSFUNCTION, BLADDER NEC 12/15/2006    Current Outpatient Prescriptions on File Prior to Visit  Medication Sig Dispense Refill  . aspirin-acetaminophen-caffeine (EXCEDRIN MIGRAINE) 250-250-65 MG tablet Take 2 tablets by mouth every 6 (six) hours as needed for headache.    . cetirizine (ZYRTEC) 10 MG tablet Take 10 mg by mouth daily.    . furosemide (LASIX) 20 MG tablet Take 1 tablet (20 mg total) by mouth daily as needed for fluid or edema. --- Office visit needed for further refills 90 tablet 0  . omeprazole (PRILOSEC) 20 MG capsule Take 1 capsule (20 mg total) by mouth daily. --- Office visit needed for further refills 90 capsule 0  . sertraline (ZOLOFT) 50 MG tablet Take 1 tablet (50 mg total) by mouth daily. -- Office visit needed for further refills 90 tablet 0  . fluticasone (FLONASE) 50 MCG/ACT nasal spray Place 1 spray into both nostrils daily as needed. (Patient not taking: Reported on  11/14/2015) 16 g 5  . [DISCONTINUED] TOVIAZ 4 MG TB24 TAKE 1 TABLET BY MOUTH EVERY DAY 30 tablet 5   No current facility-administered medications on file prior to visit.     Past Medical History:  Diagnosis Date  . Allergy   . Anemia    history of anemia 5 yrs.  . Anxiety    situational  . Asthma    MILD / RARE  . Breast cancer of lower-outer quadrant of left female breast ([place]) 01/12/2015  . Claustrophobia   . Claustrophobia   . Depression   . GERD (gastroesophageal reflux disease)   . Hyperlipidemia   . Hypertension    history of HTN and on meds til normal- no meds currently  . PONV (postoperative nausea and vomiting)    wasn't sure if it was related to morphine or epidural  . Shortness of breath dyspnea    on exertion    Past  Surgical History:  Procedure Laterality Date  . ABDOMINAL HYSTERECTOMY     for abnormal cytology; Dr Irven [place], ovaries left  . CESAREAN SECTION    . GANGLION CYST EXCISION    . MASTECTOMY W/ SENTINEL NODE BIOPSY Bilateral 03/14/2015  . MASTECTOMY W/ SENTINEL NODE BIOPSY Bilateral 03/14/2015   Procedure: BILATERAL MASTECTOMY WITH LEFT SENTINEL LYMPH NODE BIOPSY;  Surgeon: [name], MD;  Location: [facility];  Service: General;  Laterality: Bilateral;  . no colonoscopy     11/17/13 [place] reviewed  . ROBOTIC ASSISTED BILATERAL SALPINGO OOPHERECTOMY Bilateral 10/16/2015   Procedure: XI ROBOTIC ASSISTED BILATERAL SALPINGO OOPHORECTOMY ;  Surgeon: Everitt Amber, MD;  Location: [facility];  Service: Gynecology;  Laterality: Bilateral;  . TONSILLECTOMY    . TONSILLECTOMY    . VARICOSE VEIN SURGERY     as OP X2    Social History   Social History  . Marital status: Married    Spouse name: N/A  . Number of children: N/A  . Years of education: N/A   Social History Main Topics  . Smoking status: Former Smoker    Quit date: 03/31/1984  . Smokeless tobacco: Never Used     Comment: age [age] , up to 1 ppd  . Alcohol use 0.6 oz/week    1 Glasses of wine per week     Comment: OCCASIONAL  . Drug use: No  . Sexual activity: Not Asked   Other Topics Concern  . None   Social History Narrative  . None    Family History  Problem Relation Age of Onset  . Bipolar disorder Mother   . Breast cancer Mother   . Cancer Mother   . Diabetes Father   . Kidney failure Father   . Heart failure Father   . COPD Father   . Heart disease Father   . Hyperlipidemia Father   . Bipolar disorder Sister   . Heart attack Maternal Grandfather     in late 46s  . Stroke Maternal Grandmother     in 90s    Review of Systems  Constitutional: Positive for fatigue. Negative for chills and fever.  HENT: Positive for congestion.   Eyes: Negative for visual disturbance.  Respiratory: Positive for cough (some - in  morning only), shortness of breath and wheezing.   Cardiovascular: Positive for palpitations (occ) and leg swelling (from varicose veins). Negative for chest pain (chest wall pain from b/l mastectomy).  Gastrointestinal: Positive for diarrhea. Negative for abdominal pain, blood in stool, constipation and nausea.  Genitourinary: Negative  for dysuria and hematuria.  Musculoskeletal: Positive for arthralgias and joint swelling (fingers, ankles, feet when it is hot or with gravity).  Neurological: Positive for headaches (occ in AM, occ sinus ). Negative for dizziness and light-headedness.  Psychiatric/Behavioral: Negative for dysphoric mood. The patient is not nervous/anxious.        Objective:   Vitals:   08/06/16 1508  BP: 130/84  Pulse: 87  Resp: 16  Temp: 98 F (36.7 C)   Filed Weights   08/06/16 1508  Weight: 275 lb (124.7 kg)   Body mass index is 41.81 kg/m.  Wt Readings from Last 3 Encounters:  08/06/16 275 lb (124.7 kg)  11/14/15 275 lb 6.4 oz (124.9 kg)  10/10/15 278 lb (126.1 kg)     Physical Exam Constitutional: She appears well-developed and well-nourished. No distress.  HENT:  Head: Normocephalic and atraumatic.  Right Ear: External ear normal. Normal ear canal and TM Left Ear: External ear normal.  Normal ear canal and TM Mouth/Throat: Oropharynx is clear and moist.  Eyes: Conjunctivae and EOM are normal.  Neck: Neck supple. No tracheal deviation present. No thyromegaly present.  No carotid bruit  Cardiovascular: Normal rate, regular rhythm and normal heart sounds.   No murmur heard.  No edema. Varicose veins in b/l LE Pulmonary/Chest: Effort normal and breath sounds normal. No respiratory distress. She has no wheezes. She has no rales.  Breast: deferred to Gyn Abdominal: Soft. She exhibits no distension. There is no tenderness.  Lymphadenopathy: She has no cervical adenopathy.  MSK: fingers appear swollen, no other joint swelling Skin: Skin is warm and  dry. She is not diaphoretic.  Psychiatric: She has a normal mood and affect. Her behavior is normal.         Assessment & Plan:   Physical exam: Screening blood work  ordered Immunizations  shingrix discussed, td up to date Colonoscopy - never - referred Mammogram - no longer needs - s/p mastectomy Gyn  - no longer needs to see s/p TAH, mastectomy Eye exams  Up to date  EKG  Last done 03/2015 Exercise - walking daily Weight work on weight loss Substance abuse  none  See Problem List for Assessment and Plan of chronic medical problems.

## 2016-08-06 NOTE — Assessment & Plan Note (Signed)
GERD controlled Continue daily medication  

## 2016-08-06 NOTE — Assessment & Plan Note (Signed)
Diffuse joint pain r/o autoimmune arthritis - esr, crp, ana, rf

## 2016-08-06 NOTE — Assessment & Plan Note (Signed)
?   Related to asthma Trial of symbicort Check labs Consider cxr and pulm referral if no improvement

## 2016-08-06 NOTE — Assessment & Plan Note (Signed)
a1c

## 2016-08-06 NOTE — Assessment & Plan Note (Signed)
Check lipid panl

## 2016-08-06 NOTE — Patient Instructions (Addendum)
Test(s) ordered today. Your results will be released to [facility] (or called to you) after review, usually within 72hours after test completion. If any changes need to be made, you will be notified at that same time.  All other Health Maintenance issues reviewed.   All recommended immunizations and age-appropriate screenings are up-to-date or discussed.  No immunizations administered today.   Medications reviewed and updated.  No changes recommended at this time.  Your prescription(s) have been submitted to your pharmacy. Please take as directed and contact our office if you believe you are having problem(s) with the medication(s).   Please followup once a year, sooner depending on blood work    Health Maintenance, Female Adopting a healthy lifestyle and getting preventive care can go a long way to promote health and wellness. Talk with your health care provider about what schedule of regular examinations is right for you. This is a good chance for you to check in with your provider about disease prevention and staying healthy. In between checkups, there are plenty of things you can do on your own. Experts have done a lot of research about which lifestyle changes and preventive measures are most likely to keep you healthy. Ask your health care provider for more information. Weight and diet Eat a healthy diet  Be sure to include plenty of vegetables, fruits, low-fat dairy products, and lean protein.  Do not eat a lot of foods high in solid fats, added sugars, or salt.  Get regular exercise. This is one of the most important things you can do for your health.  Most adults should exercise for at least 150 minutes each week. The exercise should increase your heart rate and make you sweat (moderate-intensity exercise).  Most adults should also do strengthening exercises at least twice a week. This is in addition to the moderate-intensity exercise. Maintain a healthy weight  Body mass index  (BMI) is a measurement that can be used to identify possible weight problems. It estimates body fat based on height and weight. Your health care provider can help determine your BMI and help you achieve or maintain a healthy weight.  For females 58 years of age and older:  A BMI below 18.5 is considered underweight.  A BMI of 18.5 to 24.9 is normal.  A BMI of 25 to 29.9 is considered overweight.  A BMI of 30 and above is considered obese. Watch levels of cholesterol and blood lipids  You should start having your blood tested for lipids and cholesterol at 58 years of age, then have this test every 5 years.  You may need to have your cholesterol levels checked more often if:  Your lipid or cholesterol levels are high.  You are older than 58 years of age.  You are at high risk for heart disease. Cancer screening Lung Cancer  Lung cancer screening is recommended for adults 2-62 years old who are at high risk for lung cancer because of a history of smoking.  A yearly low-dose CT scan of the lungs is recommended for people who:  Currently smoke.  Have quit within the past 15 years.  Have at least a 30-pack-year history of smoking. A pack year is smoking an average of one pack of cigarettes a day for 1 year.  Yearly screening should continue until it has been 15 years since you quit.  Yearly screening should stop if you develop a health problem that would prevent you from having lung cancer treatment. Breast Cancer  Practice  means understanding how your breasts normally appear and feel.  It also means doing regular breast self-exams. Let your health care provider know about any changes, no matter how small.  If you are in your 20s or 30s, you should have a clinical breast exam (CBE) by a health care provider every 1-3 years as part of a regular health exam.  If you are 40 or older, have a CBE every year. Also consider having a breast X-ray (mammogram)  every year.  If you have a family history of breast cancer, talk to your health care provider about genetic screening.  If you are at high risk for breast cancer, talk to your health care provider about having an MRI and a mammogram every year.  Breast cancer gene (BRCA) assessment is recommended for women who have family members with BRCA-related cancers. BRCA-related cancers include:  Breast.  Ovarian.  Tubal.  Peritoneal cancers.  Results of the assessment will determine the need for genetic counseling and BRCA1 and BRCA2 testing. Cervical Cancer  Your health care provider may recommend that you be screened regularly for cancer of the pelvic organs (ovaries, uterus, and vagina). This screening involves a pelvic examination, including checking for microscopic changes to the surface of your cervix (Pap test). You may be encouraged to have this screening done every 3 years, beginning at age 21.  For women ages 30-65, health care providers may recommend pelvic exams and Pap testing every 3 years, or they may recommend the Pap and pelvic exam, combined with testing for human papilloma virus (HPV), every 5 years. Some types of HPV increase your risk of cervical cancer. Testing for HPV may also be done on women of any age with unclear Pap test results.  Other health care providers may not recommend any screening for nonpregnant women who are considered low risk for pelvic cancer and who do not have symptoms. Ask your health care provider if a screening pelvic exam is right for you.  If you have had past treatment for cervical cancer or a condition that could lead to cancer, you need Pap tests and screening for cancer for at least 20 years after your treatment. If Pap tests have been discontinued, your risk factors (such as having a new sexual partner) need to be reassessed to determine if screening should resume. Some women have medical problems that increase the chance of getting cervical  cancer. In these cases, your health care provider may recommend more frequent screening and Pap tests. Colorectal Cancer  This type of cancer can be detected and often prevented.  Routine colorectal cancer screening usually begins at 58 years of age and continues through 58 years of age.  Your health care provider may recommend screening at an earlier age if you have risk factors for colon cancer.  Your health care provider may also recommend using home test kits to check for hidden blood in the stool.  A small camera at the end of a tube can be used to examine your colon directly (sigmoidoscopy or colonoscopy). This is done to check for the earliest forms of colorectal cancer.  Routine screening usually begins at age 50.  Direct examination of the colon should be repeated every 5-10 years through 58 years of age. However, you may need to be screened more often if early forms of precancerous polyps or small growths are found. Skin Cancer  Check your skin from head to toe regularly.  Tell your health care provider about any new moles or   changes in moles, especially if there is a change in a mole's shape or color.  Also tell your health care provider if you have a mole that is larger than the size of a pencil eraser.  Always use sunscreen. Apply sunscreen liberally and repeatedly throughout the day.  Protect yourself by wearing long sleeves, pants, a wide-brimmed hat, and sunglasses whenever you are outside. Heart disease, diabetes, and high blood pressure  High blood pressure causes heart disease and increases the risk of stroke. High blood pressure is more likely to develop in:  People who have blood pressure in the high end of the normal range (130-139/85-89 mm Hg).  People who are overweight or obese.  People who are African American.  If you are 18-39 years of age, have your blood pressure checked every 3-5 years. If you are 40 years of age or older, have your blood pressure  checked every year. You should have your blood pressure measured twice-once when you are at a hospital or clinic, and once when you are not at a hospital or clinic. Record the average of the two measurements. To check your blood pressure when you are not at a hospital or clinic, you can use:  An automated blood pressure machine at a pharmacy.  A home blood pressure monitor.  If you are between 55 years and 79 years old, ask your health care provider if you should take aspirin to prevent strokes.  Have regular diabetes screenings. This involves taking a blood sample to check your fasting blood sugar level.  If you are at a normal weight and have a low risk for diabetes, have this test once every three years after 58 years of age.  If you are overweight and have a high risk for diabetes, consider being tested at a younger age or more often. Preventing infection Hepatitis B  If you have a higher risk for hepatitis B, you should be screened for this virus. You are considered at high risk for hepatitis B if:  You were born in a country where hepatitis B is common. Ask your health care provider which countries are considered high risk.  Your parents were born in a high-risk country, and you have not been immunized against hepatitis B (hepatitis B vaccine).  You have HIV or AIDS.  You use needles to inject street drugs.  You live with someone who has hepatitis B.  You have had sex with someone who has hepatitis B.  You get hemodialysis treatment.  You take certain medicines for conditions, including cancer, organ transplantation, and autoimmune conditions. Hepatitis C  Blood testing is recommended for:  Everyone born from 1945 through 1965.  Anyone with known risk factors for hepatitis C. Sexually transmitted infections (STIs)  You should be screened for sexually transmitted infections (STIs) including gonorrhea and chlamydia if:  You are sexually active and are younger than 58  years of age.  You are older than 58 years of age and your health care provider tells you that you are at risk for this type of infection.  Your sexual activity has changed since you were last screened and you are at an increased risk for chlamydia or gonorrhea. Ask your health care provider if you are at risk.  If you do not have HIV, but are at risk, it may be recommended that you take a prescription medicine daily to prevent HIV infection. This is called pre-exposure prophylaxis (PrEP). You are considered at risk if:  You are sexually   You are sexually active and do not regularly use condoms or know the HIV status of your partner(s).  You take drugs by injection.  You are sexually active with a partner who has HIV. Talk with your health care provider about whether you are at high risk of being infected with HIV. If you choose to begin PrEP, you should first be tested for HIV. You should then be tested every 3 months for as long as you are taking PrEP. Pregnancy  If you are premenopausal and you may become pregnant, ask your health care provider about preconception counseling.  If you may become pregnant, take 400 to 800 micrograms (mcg) of folic acid every day.  If you want to prevent pregnancy, talk to your health care provider about birth control (contraception). Osteoporosis and menopause  Osteoporosis is a disease in which the bones lose minerals and strength with aging. This can result in serious bone fractures. Your risk for osteoporosis can be identified using a bone density scan.  If you are 47 years of age or older, or if you are at risk for osteoporosis and fractures, ask your health care provider if you should be screened.  Ask your health care provider whether you should take a calcium or vitamin D supplement to lower your risk for osteoporosis.  Menopause may have certain physical symptoms and risks.  Hormone replacement therapy may reduce some of these symptoms and risks. Talk  to your health care provider about whether hormone replacement therapy is right for you. Follow these instructions at home:  Schedule regular health, dental, and eye exams.  Stay current with your immunizations.  Do not use any tobacco products including cigarettes, chewing tobacco, or electronic cigarettes.  If you are pregnant, do not drink alcohol.  If you are breastfeeding, limit how much and how often you drink alcohol.  Limit alcohol intake to no more than 1 drink per day for nonpregnant women. One drink equals 12 ounces of beer, 5 ounces of wine, or 1 ounces of hard liquor.  Do not use street drugs.  Do not share needles.  Ask your health care provider for help if you need support or information about quitting drugs.  Tell your health care provider if you often feel depressed.  Tell your health care provider if you have ever been abused or do not feel safe at home. This information is not intended to replace advice given to you by your health care provider. Make sure you discuss any questions you have with your health care provider. Document Released: 09/30/2010 Document Revised: 08/23/2015 Document Reviewed: 12/19/2014 Elsevier Interactive Patient Education  2017 [employer].

## 2016-08-08 ENCOUNTER — Other Ambulatory Visit (INDEPENDENT_AMBULATORY_CARE_PROVIDER_SITE_OTHER): Payer: Managed Care, Other (non HMO)

## 2016-08-08 DIAGNOSIS — E7800 Pure hypercholesterolemia, unspecified: Secondary | ICD-10-CM | POA: Diagnosis not present

## 2016-08-08 DIAGNOSIS — M2550 Pain in unspecified joint: Secondary | ICD-10-CM | POA: Diagnosis not present

## 2016-08-08 DIAGNOSIS — R600 Localized edema: Secondary | ICD-10-CM | POA: Diagnosis not present

## 2016-08-08 DIAGNOSIS — Z0000 Encounter for general adult medical examination without abnormal findings: Secondary | ICD-10-CM

## 2016-08-08 DIAGNOSIS — R195 Other fecal abnormalities: Secondary | ICD-10-CM

## 2016-08-08 DIAGNOSIS — R739 Hyperglycemia, unspecified: Secondary | ICD-10-CM

## 2016-08-08 LAB — LIPID PANEL
CHOL/HDL RATIO: 5
Cholesterol: 198 mg/dL (ref 0–200)
HDL: 38.9 mg/dL — ABNORMAL LOW (ref >39.00)
LDL CALC: 130 mg/dL — AB (ref 0–99)
NONHDL: 158.61
Triglycerides: 142 mg/dL (ref 0.0–149.0)
VLDL: 28.4 mg/dL (ref 0.0–40.0)

## 2016-08-08 LAB — CBC WITH DIFFERENTIAL/PLATELET
BASOS PCT: 0.8 % (ref 0.0–3.0)
Basophils Absolute: 0 10*3/uL (ref 0.0–0.1)
EOS PCT: 1.4 % (ref 0.0–5.0)
Eosinophils Absolute: 0.1 10*3/uL (ref 0.0–0.7)
HCT: 42.4 % (ref 36.0–46.0)
Hemoglobin: 14 g/dL (ref 12.0–15.0)
Lymphocytes Relative: 26 % (ref 12.0–46.0)
Lymphs Abs: 1.3 10*3/uL (ref 0.7–4.0)
MCHC: 33 g/dL (ref 30.0–36.0)
MCV: 81.3 fl (ref 78.0–100.0)
MONO ABS: 0.3 10*3/uL (ref 0.1–1.0)
MONOS PCT: 5.4 % (ref 3.0–12.0)
NEUTROS ABS: 3.4 10*3/uL (ref 1.4–7.7)
NEUTROS PCT: 66.4 % (ref 43.0–77.0)
Platelets: 244 10*3/uL (ref 150.0–400.0)
RBC: 5.22 Mil/uL — ABNORMAL HIGH (ref 3.87–5.11)
RDW: 15.5 % (ref 11.5–15.5)
WBC: 5.1 10*3/uL (ref 4.0–10.5)

## 2016-08-08 LAB — COMPREHENSIVE METABOLIC PANEL
ALT: 32 U/L (ref 0–35)
AST: 29 U/L (ref 0–37)
Albumin: 4.2 g/dL (ref 3.5–5.2)
Alkaline Phosphatase: 112 U/L (ref 39–117)
BUN: 12 mg/dL (ref 6–23)
CHLORIDE: 106 meq/L (ref 96–112)
CO2: 25 meq/L (ref 19–32)
Calcium: 9 mg/dL (ref 8.4–10.5)
Creatinine, Ser: 0.85 mg/dL (ref 0.40–1.20)
GFR: 73.16 mL/min (ref >60.00)
GLUCOSE: 109 mg/dL — AB (ref 70–99)
POTASSIUM: 4 meq/L (ref 3.5–5.1)
SODIUM: 140 meq/L (ref 135–145)
TOTAL PROTEIN: 7.3 g/dL (ref 6.0–8.3)
Total Bilirubin: 0.5 mg/dL (ref 0.2–1.2)

## 2016-08-08 LAB — HEMOGLOBIN A1C: HEMOGLOBIN A1C: 5.6 % (ref 4.6–6.5)

## 2016-08-08 LAB — SEDIMENTATION RATE: SED RATE: 37 mm/h — AB (ref 0–30)

## 2016-08-08 LAB — TSH: TSH: 2.75 u[IU]/mL (ref 0.35–4.50)

## 2016-08-08 LAB — C-REACTIVE PROTEIN: CRP: 1.5 mg/dL (ref 0.5–20.0)

## 2016-08-10 LAB — RETICULIN ANTIBODIES, IGA W TITER: Reticulin Ab, IgA: NEGATIVE

## 2016-08-11 ENCOUNTER — Encounter: Payer: Self-pay | Admitting: Internal Medicine

## 2016-08-11 LAB — GLIADIN ANTIBODIES, SERUM
Deamidated Gliadin Abs, IgG: 2 U
Gliadin IgA: 7 U

## 2016-08-11 LAB — TISSUE TRANSGLUTAMINASE, IGA: Tissue Transglutaminase Ab, IgA: 1 U/mL (ref <4)

## 2016-08-11 LAB — ANA: Anti Nuclear Antibody(ANA): NEGATIVE

## 2016-08-11 LAB — RHEUMATOID FACTOR: Rhuematoid fact SerPl-aCnc: <14 IU/mL (ref <14)

## 2016-08-30 ENCOUNTER — Other Ambulatory Visit: Payer: Self-pay | Admitting: Internal Medicine

## 2016-09-16 ENCOUNTER — Encounter: Payer: Self-pay | Admitting: Internal Medicine

## 2016-09-16 DIAGNOSIS — R5383 Other fatigue: Secondary | ICD-10-CM

## 2016-09-16 DIAGNOSIS — J45909 Unspecified asthma, uncomplicated: Secondary | ICD-10-CM

## 2016-10-02 ENCOUNTER — Encounter: Payer: Self-pay | Admitting: Internal Medicine

## 2016-10-02 ENCOUNTER — Ambulatory Visit (INDEPENDENT_AMBULATORY_CARE_PROVIDER_SITE_OTHER)
Admission: RE | Admit: 2016-10-02 | Discharge: 2016-10-02 | Disposition: A | Discharge status: Home / Self Care | Payer: Managed Care, Other (non HMO) | Source: Ambulatory Visit | Attending: Internal Medicine | Admitting: Internal Medicine

## 2016-10-02 ENCOUNTER — Ambulatory Visit (INDEPENDENT_AMBULATORY_CARE_PROVIDER_SITE_OTHER): Payer: Managed Care, Other (non HMO) | Admitting: Internal Medicine

## 2016-10-02 VITALS — BP 130/80 | HR 81 | Ht 68.5 in | Wt 275.2 lb

## 2016-10-02 DIAGNOSIS — R0609 Other forms of dyspnea: Secondary | ICD-10-CM

## 2016-10-02 DIAGNOSIS — J45991 Cough variant asthma: Secondary | ICD-10-CM | POA: Diagnosis not present

## 2016-10-02 MED ORDER — PANTOPRAZOLE SODIUM 40 MG PO TBEC: 40.0000 mg | DELAYED_RELEASE_TABLET | Freq: Every day | ORAL | 2 refills | Status: DC

## 2016-10-02 MED ORDER — MOMETASONE FURO-FORMOTEROL FUM 100-5 MCG/ACT IN AERO: 2.0000 | INHALATION_SPRAY | Freq: Two times a day (BID) | RESPIRATORY_TRACT | 0 refills | Status: DC

## 2016-10-02 MED ORDER — FAMOTIDINE 20 MG PO TABS: ORAL_TABLET | ORAL | 2 refills | Status: DC

## 2016-10-02 NOTE — Progress Notes (Signed)
Subjective:     Patient ID: Jennifer Park, female   DOB: 10-Apr-1960,    MRN: [id number]  HPI  36 yowf grew up in [place]/ [place]  Quit smoking in 1986 with onset around then severe cough with uri's assoc with sinus infections/ dx bronchitis rx flonase/ otc and some better esp p moved to [place] then back to [place] in her late 66 with onset late fall rhinitis/ cough some better over time s allergy eval then onset of sob around 2015 assoc with cough eval by Dr Fredderick Phenix and Pos allergy to cockroaches and ? slt asthma on saba but no change until 2016 bilateral mastectomy and since then daily symptoms of   doe and variable globus referred to pulmonary clinic 10/02/2016 by Dr  Billey Gosling.    10/02/2016 1st [facility] office visit/ Jennifer Park   Chief Complaint  Patient presents with  . Advice Only    Referred by Dr. Billey Gosling due to having SOB not only on exertion but also when laying down. Pt feels like she has mucus lodged in her chest that seems to be affecting her voice. Possible asthma? Was prescribed symbicort and SOB is much better on medication.  prev to starting symbicort could not take a deep breath in and is better on symb but still variable severe globus sensation on symb 80 2bid with suboptimal technique   Ran /walked 10 k in jan 2018 but worse doe since then to point of sometimes sob at rest and noct   Overt hb on ppi qd   No obvious patterns  day to day or daytime variability or assoc excess/ purulent sputum or mucus plugs or hemoptysis or cp or chest tightness, subjective wheeze or overt sinus   symptoms. No unusual exp hx or h/o childhood pna/ asthma or knowledge of premature birth.  Sleeping ok without nocturnal  or early am exacerbation  of respiratory  c/o's or need for noct saba. Also denies any obvious fluctuation of symptoms with weather or environmental changes or other aggravating or alleviating factors except as outlined above   Current Medications, Allergies, Complete Past  Medical History, Past Surgical History, Family History, and Social History were reviewed in [facility] record.  ROS  The following are not active complaints unless bolded sore throat, dysphagia, dental problems, itching, sneezing,  nasal congestion or excess/ purulent secretions, ear ache,   fever, chills, sweats, unintended wt loss, classically pleuritic or exertional cp,  orthopnea pnd or leg swelling, presyncope, palpitations, abdominal pain, anorexia, nausea, vomiting, diarrhea  or change in bowel or bladder habits, change in stools or urine, dysuria,hematuria,  rash, arthralgias, visual complaints, headache, numbness, weakness or ataxia or problems with walking or coordination,  change in mood/affect or memory.           Review of Systems     Objective:   Physical Exam    amb obese wf nad vigorous throat clearing   Wt Readings from Last 3 Encounters:  10/02/16 275 lb 3.2 oz (124.8 kg)  08/06/16 275 lb (124.7 kg)  11/14/15 275 lb 6.4 oz (124.9 kg)    Vital signs reviewed - Note on arrival 02 sats  95 % on RA     HEENT: nl dentition, turbinates bilaterally, and oropharynx. Nl external ear canals without cough reflex   NECK :  without JVD/Nodes/TM/ nl carotid upstrokes bilaterally   LUNGS: no acc muscle use,  Nl contour chest which is clear to A and P bilaterally  without cough on insp or exp maneuvers   CV:  RRR  no s3 or murmur or increase in P2, and no edema   ABD:  soft and nontender with nl inspiratory excursion in the supine position. No bruits or organomegaly appreciated, bowel sounds nl  MS:  Nl gait/ ext warm without deformities, calf tenderness, cyanosis or clubbing No obvious joint restrictions   SKIN: warm and dry without lesions    NEURO:  alert, approp, nl sensorium with  no motor or cerebellar deficits apparent.    CXR PA and Lateral:   10/02/2016 :    I personally reviewed images and agree with radiology impression as follows:    slt reduced lung vol with T kyphosis, min CM/ no lung dz     Assessment:

## 2016-10-02 NOTE — Patient Instructions (Addendum)
symbicort 80 (dulera) Take 2 puffs first thing in am and then another 2 puffs about 12 hours later.    Work on inhaler technique:  relax and gently blow all the way out then take a nice smooth deep breath back in, triggering the inhaler at same time you start breathing in.  Hold for up to 5 seconds if you can. Blow out thru nose. Rinse and gargle with water when done    Please remember to go to the  x-ray department downstairs in the basement  for your tests - we will call you with the results when they are available.  Pantoprazole (protonix) 40 mg   Take  30-60 min before first meal of the day and Pepcid (famotidine)  20 mg one @  bedtime until return to office - this is the best way to tell whether stomach acid is contributing to your problem.    GERD (REFLUX)  is an extremely common cause of respiratory symptoms just like yours , many times with no obvious heartburn at all.    It can be treated with medication, but also with lifestyle changes including elevation of the head of your bed (ideally with 6 inch  bed blocks),  Smoking cessation, avoidance of late meals, excessive alcohol, and avoid fatty foods, chocolate, peppermint, colas, red wine, and acidic juices such as orange juice.  NO MINT OR MENTHOL PRODUCTS SO NO COUGH DROPS   USE SUGARLESS CANDY INSTEAD (Jolley ranchers or Stover's or Life Savers) or even ice chips will also do - the key is to swallow to prevent all throat clearing. NO OIL BASED VITAMINS - use powdered substitutes.   Please schedule a follow up office visit in 6 weeks, call sooner if needed  Late add:  Do Epworth scoring on return, consider sleep study once we address  the noct dyspnea /golbus problem         .

## 2016-10-02 NOTE — Assessment & Plan Note (Addendum)
Body mass index is 41.24 kg/m.  -  trending up  Lab Results  Component Value Date   TSH 2.75 08/08/2016     Contributing to gerd and osa risk/ doe/reviewed the need and the process to achieve and maintain neg calorie balance > defer f/u primary care including intermittently monitoring thyroid status

## 2016-10-03 ENCOUNTER — Encounter: Payer: Self-pay | Admitting: Internal Medicine

## 2016-10-03 NOTE — Progress Notes (Signed)
Spoke with pt and notified of results per Dr. Wert. Pt verbalized understanding and denied any questions. 

## 2016-10-03 NOTE — Assessment & Plan Note (Signed)
10/02/2016  Walked RA x 3 laps @ 185 ft each stopped due to  End of study - very fast pace, no sob or desat - Spirometry 10/02/2016  FEV1 2.41 (78%)  Ratio 87  Symptoms are markedly disproportionate to objective findings and not clear this is actually much of a  lung problem but pt does appear to have difficult to sort out respiratory symptoms of unknown origin for which  DDX  = almost all start with A and  include Adherence, Ace Inhibitors, Acid Reflux, Active Sinus Disease, Alpha 1 Antitripsin deficiency, Anxiety masquerading as Airways dz,  ABPA,  Allergy(esp in young), Aspiration (esp in elderly), Adverse effects of meds,  Active smokers, A bunch of PE's (a small clot burden can't cause this syndrome unless there is already severe underlying pulm or vascular dz with poor reserve) plus two Bs  = Bronchiectasis and Beta blocker use..and one C= CHF     Adherence is always the initial "prime suspect" and is a multilayered concern that requires a "trust but verify" approach in every patient - starting with knowing how to use medications, especially inhalers, correctly, keeping up with refills and understanding the fundamental difference between maintenance and prns vs those medications only taken for a very short course and then stopped and not refilled.  - see hfa teaching sep a/p   ? Acid (or non-acid) GERD > always difficult to exclude as up to 75% of pts in some series report no assoc GI/ Heartburn symptoms> rec max (24h)  acid suppression and diet restrictions/ reviewed and instructions given in writing.   ? Anxiety > usually at the bottom of this list of usual suspects but should be much higher on this pt's based on H and P and note already on psychotropics .   ? Allergy/ asthma > neg w/u by Harold Hedge x for cockroaches per pt/ rx otc zyrtec and low dose ics approp  ?  A bunch of pe's > extremely unlikely based on today's rapid walk   ? CHF > nothing to suggest though could have an element of  diastolic dysfunction    For now focus on rx for gerd and regroup in 6 weeks  Total time devoted to counseling  > 50 % of initial 60 min office visit:  review case with pt/ discussion of options/alternatives/ personally creating written customized instructions  in presence of pt  then going over those specific  Instructions directly with the pt including how to use all of the meds but in particular covering each new medication in detail and the difference between the maintenance= "automatic" meds and the prns using an action plan format for the latter (If this problem/symptom => do that organization reading Left to right).  Please see AVS from this visit for a full list of these instructions which I personally wrote for this pt and  are unique to this visit.

## 2016-10-03 NOTE — Assessment & Plan Note (Addendum)
Spirometry 10/02/2016  Mildly restrictive with min curvature on symb 80 2bid - 10/02/2016  After extensive coaching HFA effectiveness =    75% from a baseline of 25%   She reports some improvement in non-specific symptoms on sym 80 despite very poor technique but reasonable to continue and work on better hfa - avoid dpi here as they tend to aggravate dpi  However,   Much more likely this is Upper airway cough syndrome (previously labeled PNDS) , is  so named because it's frequently impossible to sort out how much is  CR/sinusitis with freq throat clearing (which can be related to primary GERD)   vs  causing  secondary (" extra esophageal")  GERD from wide swings in gastric pressure that occur with throat clearing, often  promoting self use of mint and menthol lozenges that reduce the lower esophageal sphincter tone and exacerbate the problem further in a cyclical fashion.   These are the same pts (now being labeled as having "irritable larynx syndrome" by some cough centers) who not infrequently have a history of having failed to tolerate ace inhibitors,  dry powder inhalers or biphosphonates or report having atypical/extraesophageal reflux symptoms that don't respond to standard doses of PPI  and are easily confused as having aecopd or asthma flares by even experienced allergists/ pulmonologists (myself included).  Of the three most common causes of  Sub-acute or recurrent or chronic cough, only one (GERD)  can actually contribute to/ trigger  the other two (asthma and post nasal drip syndrome)  and perpetuate the cylce of cough.  While not intuitively obvious, many patients with chronic low grade reflux do not cough until there is a primary insult that disturbs the protective epithelial barrier and exposes sensitive nerve endings.   This is typically viral but can be direct physical injury such as with an endotracheal tube.   The point is that once this occurs, it is difficult to eliminate the cycle  using  anything but a maximally effective acid suppression regimen at least in the short run, accompanied by an appropriate diet to address non acid GERD and control / eliminate the cough if possible with use of non mint/ menthol lozenges and if needed add tessalon 200 mg tid to rx  see avs for instructions unique to this ov

## 2016-11-10 ENCOUNTER — Encounter: Payer: Self-pay | Admitting: Internal Medicine

## 2016-11-10 ENCOUNTER — Ambulatory Visit (INDEPENDENT_AMBULATORY_CARE_PROVIDER_SITE_OTHER): Payer: Managed Care, Other (non HMO) | Admitting: Internal Medicine

## 2016-11-10 VITALS — BP 134/78 | HR 80 | Ht 68.5 in | Wt 281.4 lb

## 2016-11-10 DIAGNOSIS — R0609 Other forms of dyspnea: Secondary | ICD-10-CM | POA: Diagnosis not present

## 2016-11-10 DIAGNOSIS — J45991 Cough variant asthma: Secondary | ICD-10-CM | POA: Diagnosis not present

## 2016-11-10 DIAGNOSIS — R5382 Chronic fatigue, unspecified: Secondary | ICD-10-CM

## 2016-11-10 MED ORDER — ALBUTEROL SULFATE HFA 108 (90 BASE) MCG/ACT IN AERS: INHALATION_SPRAY | RESPIRATORY_TRACT | 1 refills | Status: DC

## 2016-11-10 NOTE — Patient Instructions (Signed)
Please see patient coordinator before you leave today  to schedule sleep study   Work on inhaler technique:  relax and gently blow all the way out then take a nice smooth deep breath back in, triggering the inhaler at same time you start breathing in.  Hold for up to 5 seconds if you can. Blow out thru nose. Rinse and gargle with water when done  Keep working on the wt loss  -  Pace yourself to where you are short of breath not out of breath or hurting in any joints at 30 minutes daily    If you are satisfied with your treatment plan,  let your doctor know and he/she can either refill your medications or you can return here when your prescription runs out.     If in any way you are not 100% satisfied,  please tell us.  If 100% better, tell your friends!  Pulmonary follow up is as needed

## 2016-11-10 NOTE — Progress Notes (Signed)
Subjective:     Patient ID: Jennifer Park, female   DOB: [birth year],    MRN: [id number]    Brief patient profile:  75 yowf grew up in [place]/ [place]  Quit smoking in 1986 with onset around then severe cough with uri's assoc with sinus infections/ dx bronchitis rx flonase/ otc and some better esp p moved to [place] then back to [place] in her late 48s with onset late fall rhinitis/ cough some better over time s allergy eval then onset of sob around 2015 assoc with cough eval by Dr Fredderick Phenix and Pos allergy to cockroaches and ? slt asthma on saba but no change until 2016 bilateral mastectomy and since then daily symptoms of  doe and variable globus referred to pulmonary clinic 10/02/2016 by Dr  Billey Gosling.    10/02/2016 1st [facility] office visit/ Myesha Stillion   Chief Complaint  Patient presents with  . Advice Only    Referred by Dr. Billey Gosling due to having SOB not only on exertion but also when laying down. Pt feels like she has mucus lodged in her chest that seems to be affecting her voice. Possible asthma? Was prescribed symbicort and SOB is much better on medication.  prev to starting symbicort could not take a deep breath in and is better on symb but still variable severe globus sensation on symb 80 2bid with suboptimal technique  Ran /walked 10 k in jan 2018 but worse doe since then to point of sometimes sob at rest and noct  Overt hb on ppi qd  rec symbicort 80 (dulera) Take 2 puffs first thing in am and then another 2 puffs about 12 hours later.  Work on inhaler technique: Please remember to go to the  x-ray department downstairs in the basement  for your tests - we will call you with the results when they are available. Pantoprazole (protonix) 40 mg   Take  30-60 min before first meal of the day and Pepcid (famotidine)  20 mg one @  bedtime until return to office - this is the best way to tell whether stomach acid is contributing to your problem.   GERD diet  Please schedule a follow up office  visit in 6 weeks, call sooner if needed    11/10/2016  f/u ov/Margaretha Mahan re:   Asthma/   Fatigue > hypersomnolence  On ppi/ h2 hs and symb 80 2bid  Chief Complaint  Patient presents with  . Follow-up    some GERD issues resolved since last OV but still notes DOE.     Doe x MMRC1 = can walk nl pace, flat grade, can't hurry or go uphills or steps s sob   No longer any resting sob or noct sob     No obvious day to day or daytime variability or assoc excess/ purulent sputum or mucus plugs or hemoptysis or cp or chest tightness, subjective wheeze or overt sinus or hb symptoms. No unusual exp hx or h/o childhood pna/ asthma or knowledge of premature birth.  Sleeping ok without nocturnal  or early am exacerbation  of respiratory  c/o's or need for noct saba. Also denies any obvious fluctuation of symptoms with weather or environmental changes or other aggravating or alleviating factors except as outlined above   Current Medications, Allergies, Complete Past Medical History, Past Surgical History, Family History, and Social History were reviewed in [facility] record.  ROS  The following are not active complaints unless bolded sore throat, dysphagia,  dental problems, itching, sneezing,  nasal congestion or excess/ purulent secretions, ear ache,   fever, chills, sweats, unintended wt loss, classically pleuritic or exertional cp,  orthopnea pnd or leg swelling, presyncope, palpitations, abdominal pain, anorexia, nausea, vomiting, diarrhea  or change in bowel or bladder habits, change in stools or urine, dysuria,hematuria,  rash, arthralgias, visual complaints, headache, numbness, weakness or ataxia or problems with walking or coordination,  change in mood/affect or memory.                 Objective:   Physical Exam    amb obese wf nad  Min throat clearing    11/10/2016      281   10/02/16 275 lb 3.2 oz (124.8 kg)  08/06/16 275 lb (124.7 kg)  11/14/15 275 lb 6.4 oz (124.9 kg)     Vital signs reviewed - Note on arrival 02 sats  95 % on RA     HEENT: nl dentition, turbinates bilaterally, and oropharynx. Nl external ear canals without cough reflex   NECK :  without JVD/Nodes/TM/ nl carotid upstrokes bilaterally   LUNGS: no acc muscle use,  Nl contour chest which is clear to A and P bilaterally without cough on insp or exp maneuvers   CV:  RRR  no s3 or murmur or increase in P2, and no edema   ABD:  soft and nontender with nl inspiratory excursion in the supine position. No bruits or organomegaly appreciated, bowel sounds nl  MS:  Nl gait/ ext warm without deformities, calf tenderness, cyanosis or clubbing No obvious joint restrictions   SKIN: warm and dry without lesions    NEURO:  alert, approp, nl sensorium with  no motor or cerebellar deficits apparent.    CXR PA and Lateral:   10/02/2016 :    I personally reviewed images and agree with radiology impression as follows:   slt reduced lung vol with T kyphosis, min CM/ no lung dz     Assessment:     Outpatient Encounter Prescriptions as of 11/10/2016  Medication Sig  . aspirin-acetaminophen-caffeine (EXCEDRIN MIGRAINE) 250-250-65 MG tablet Take 2 tablets by mouth every 6 (six) hours as needed for headache.  . budesonide-formoterol (SYMBICORT) 80-4.5 MCG/ACT inhaler Inhale 2 puffs into the lungs 2 (two) times daily.  . cetirizine (ZYRTEC) 10 MG tablet Take 10 mg by mouth daily.  . famotidine (PEPCID) 20 MG tablet One at bedtime  . furosemide (LASIX) 20 MG tablet Take 1 tablet (20 mg total) by mouth daily as needed for fluid or edema.  . pantoprazole (PROTONIX) 40 MG tablet Take 1 tablet (40 mg total) by mouth daily. Take 30-60 min before first meal of the day  . sertraline (ZOLOFT) 50 MG tablet Take 1 tablet (50 mg total) by mouth daily.  Marland Kitchen albuterol (PROAIR HFA) 108 (90 Base) MCG/ACT inhaler 2 puffs every 4 hours as needed only  if your can't catch your breath  . [DISCONTINUED] mometasone-formoterol  (DULERA) 100-5 MCG/ACT AERO Inhale 2 puffs into the lungs 2 (two) times daily. (Patient not taking: Reported on 11/10/2016)   No facility-administered encounter medications on file as of 11/10/2016.

## 2016-11-12 NOTE — Assessment & Plan Note (Signed)
Epworth score 11/10/2016  = 9 >> split night study rec with f/u sleep medicine prn

## 2016-11-12 NOTE — Assessment & Plan Note (Signed)
Body mass index is 42.16 kg/m.  -  trending up Lab Results  Component Value Date   TSH 2.75 08/08/2016     Contributing to gerd risk/ doe/reviewed the need and the process to achieve and maintain neg calorie balance > defer f/u primary care including intermittently monitoring thyroid status

## 2016-11-12 NOTE — Assessment & Plan Note (Addendum)
10/02/2016  Walked RA x 3 laps @ 185 ft each stopped due to  End of study - very fast pace, no sob or desat - Spirometry 10/02/2016  FEV1 2.41 (78%)  Ratio 87   I had an extended final summary discussion with the patient reviewing all relevant studies completed to date and  lasting 15 to 20 minutes of a 25 minute visit on the following issues:   Improved with rx of gerd/ asthma > no f/u needed unless flares on rx with low dose ics/laba  Each maintenance medication was reviewed in detail including most importantly the difference between maintenance and as needed and under what circumstances the prns are to be used.  Please see AVS for specific  Instructions which are unique to this visit and I personally typed out  which were reviewed in detail in writing with the patient and a copy provided.

## 2016-11-12 NOTE — Assessment & Plan Note (Signed)
Spirometry 10/02/2016  Mildly restrictive with min curvature on symb 80 2bid  - 11/10/2016  After extensive coaching HFA effectiveness =    75% (late inhale)   Despite suboptimal hfa >  All goals of chronic asthma control met including optimal function and elimination of symptoms with minimal need for rescue therapy.  Contingencies discussed in full including contacting this office immediately if not controlling the symptoms using the rule of two's.

## 2016-12-01 IMAGING — CT CT ABD-PELV W/ CM
2 of 5 series · 15 of 46 positions shown, 17 images · IV contrast (APPLIED)
Comparison: None.

CLINICAL DATA: Left lower quadrant pain. Evaluate for
diverticulitis. Recent history of breast cancer with bilateral
mastectomy.

EXAM:
CT ABDOMEN AND PELVIS WITH CONTRAST
TECHNIQUE: Multidetector CT imaging of the abdomen and pelvis was performed
using the standard protocol following bolus administration of
intravenous contrast.
CONTRAST:  100mL NEHNKZ-2FF IOPAMIDOL (NEHNKZ-2FF) INJECTION 61%

[Series 2: axial st · axial · 0.95mm/px · z∈[+822,+1292]mm · 12 of 106 slices shown, 14 images]
[im 6/106  soft-tissue]
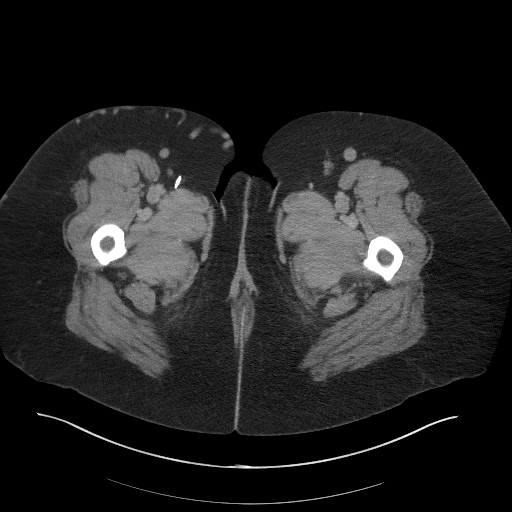
[im 6/106  bone]
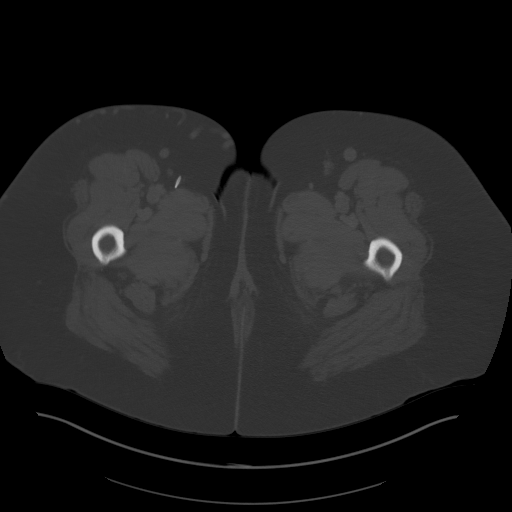
[im 16/106  soft-tissue]
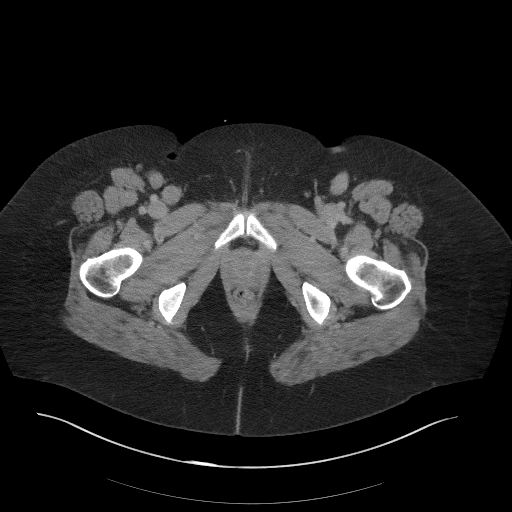
[im 22/106  soft-tissue]
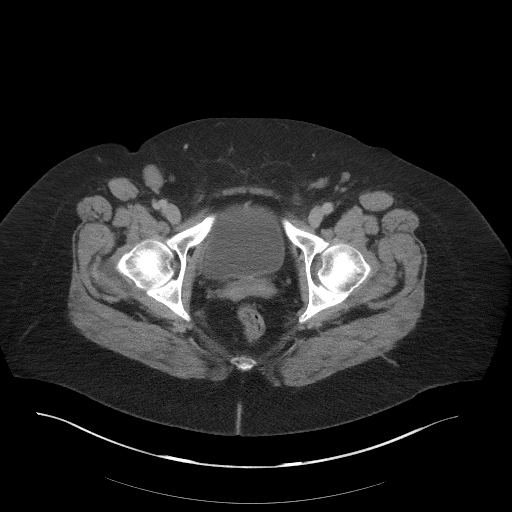
[im 32/106  soft-tissue]
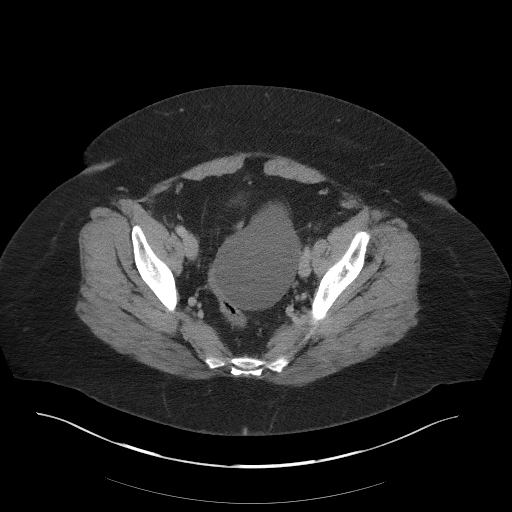
[im 43/106  soft-tissue]
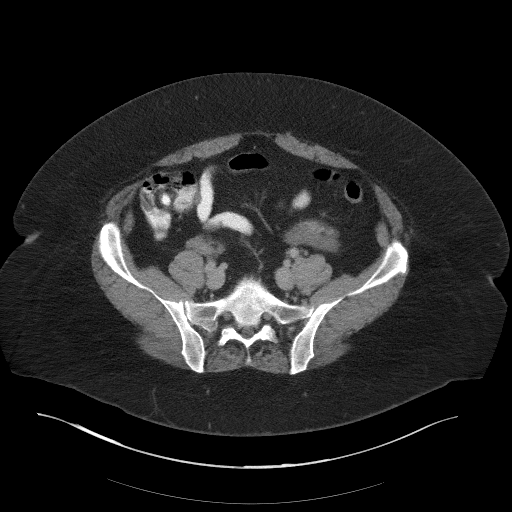
[im 48/106  soft-tissue]
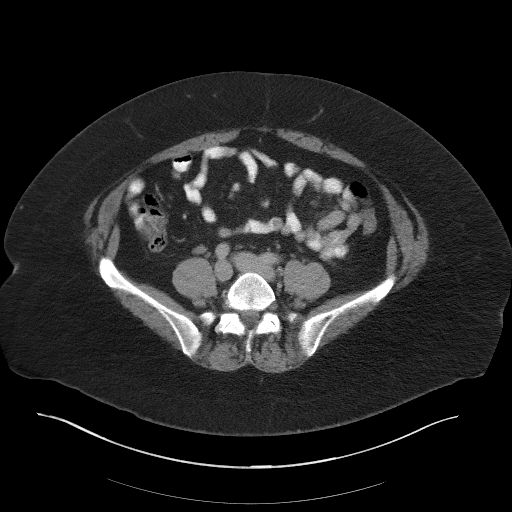
[im 58/106  soft-tissue]
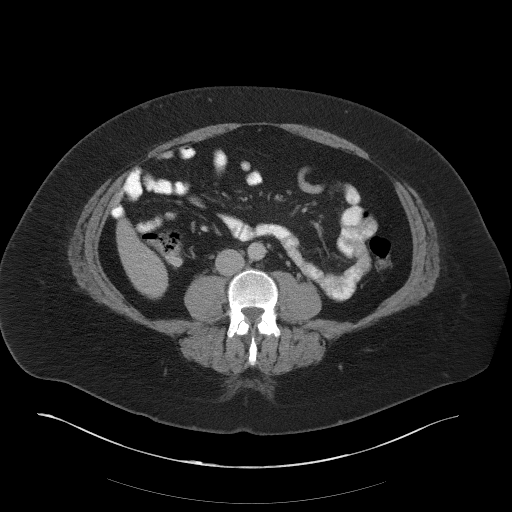
[im 64/106  soft-tissue]
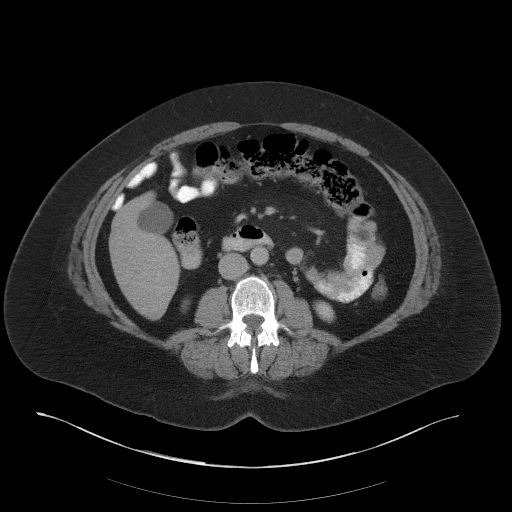
[im 74/106  soft-tissue]
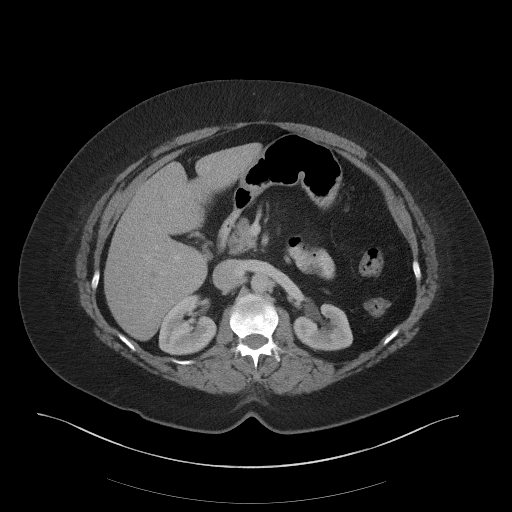
[im 74/106  bone]
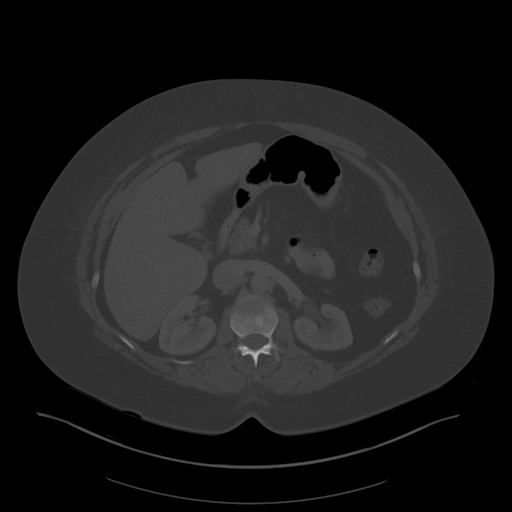
[im 85/106  soft-tissue]
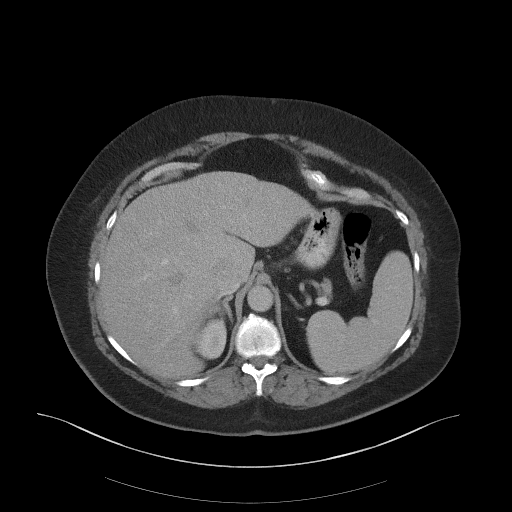
[im 90/106  soft-tissue]
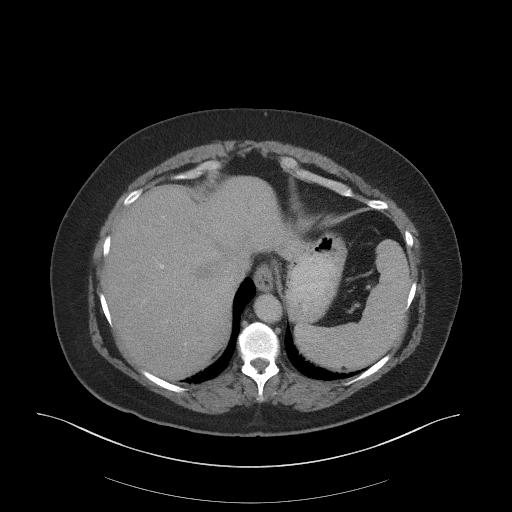
[im 100/106  soft-tissue]
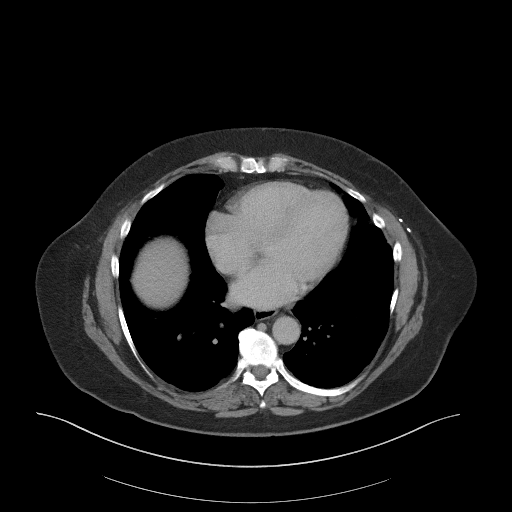

[Series 5: coronal st · coronal · 1.05mm/px · 3 of 103 slices shown]
[im 35/103  soft-tissue]
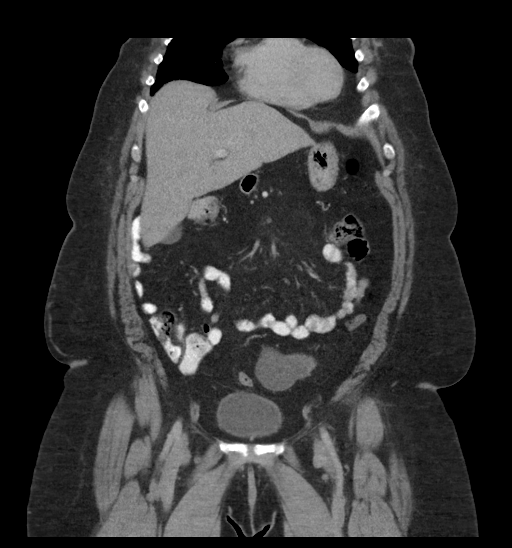
[im 46/103  soft-tissue]
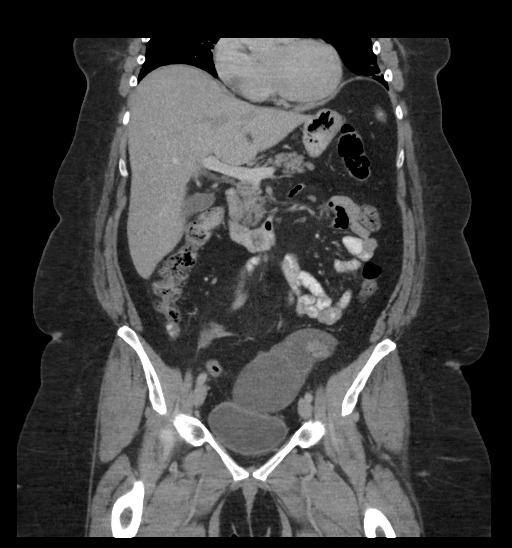
[im 57/103  soft-tissue]
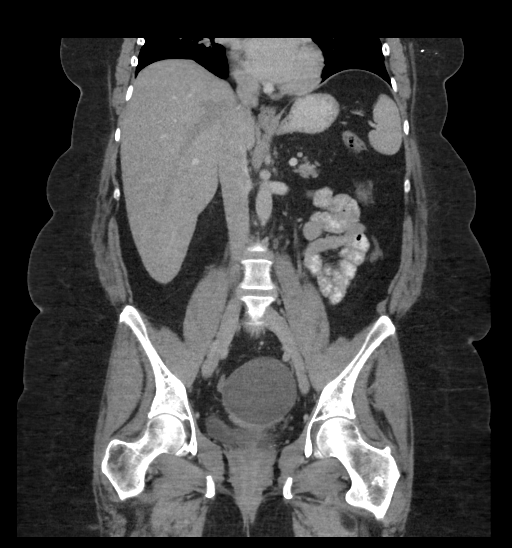

[15 of 46 positions shown; findings below may reference images not displayed]

FINDINGS: Lower chest: 5 mm nodule along the posterior right lower lobe is
indeterminate. This is seen on sequence 4, image 4. No large pleural
effusions. Few patchy densities in the lower lungs could represent
atelectasis.

Hepatobiliary: Normal appearance of the liver, gallbladder and
portal venous system.

Pancreas: Normal appearance of the pancreas without inflammation or
duct dilatation.

Spleen: Normal appearance of spleen without enlargement.

Adrenals/Urinary Tract: Normal adrenal glands. All normal appearance
of bilateral kidneys and urinary bladder.

Stomach/Bowel: Normal appearance of stomach and duodenum. Mild
stranding in the central abdominal mesentery is nonspecific.
Prominent mesenteric lymph node on sequence 2, image 37 measures up
to 1 cm. There is no evidence for bowel dilatation or obstruction.
Normal appendix. No acute abnormality to the colon.

Vascular/Lymphatic: Prominent varicosities in the right upper thigh
raise concern for underlying venous insufficiency in the right lower
extremity. Enlarged superficial venous structure in the left
proximal thigh. No significant atherosclerotic disease in the aorta
or iliac arteries. No aortic aneurysm. Prominent mesenteric lymph
node as described. Overall, there is no suspicious lymphadenopathy.

Reproductive: Uterus has been removed. Right ovarian tissue is
present. There is a large complex low-density lesion involving the
left adnexa or the left ovary. This is concerning for a large cystic
neoplasm. This lesion is difficult to measure due to its
configuration but roughly measures 12.0 x 6.2 x 7.6 cm. This extends
from the left ovary region down to the mid lower pelvis. The most
caudal aspect of this lesion is near the vaginal cuff.

Other: No free fluid.  Negative for free air.

Musculoskeletal: No acute bone abnormality. Disc space narrowing at
L5-S1.
IMPRESSION: Large complex low-density structure in the pelvis. This appears to
be associated with the left ovary and findings are concerning for a
cystic neoplasm. Recommend further evaluation with pelvic ultrasound
and gynecology consultation.

Hysterectomy.

Indeterminate 5 mm nodule in the right lung. Based on the history of
malignancy, recommend a follow-up chest CT to look for additional
nodularity.

Enlarged venous structures in the upper thighs, particularly on the
right side. Findings are suggestive for underlying venous
insufficiency.

These results were called by telephone at the time of interpretation
on 09/14/2015 at [DATE] to Dr. M HAMZA TIGER , who verbally
acknowledged these results.

## 2016-12-30 ENCOUNTER — Other Ambulatory Visit: Payer: Self-pay | Admitting: Internal Medicine

## 2017-01-04 ENCOUNTER — Ambulatory Visit (HOSPITAL_BASED_OUTPATIENT_CLINIC_OR_DEPARTMENT_OTHER): Payer: Managed Care, Other (non HMO) | Attending: Internal Medicine | Admitting: Pulmonary Disease

## 2017-01-04 VITALS — Ht 68.0 in | Wt 270.0 lb

## 2017-01-04 DIAGNOSIS — R0683 Snoring: Secondary | ICD-10-CM | POA: Insufficient documentation

## 2017-01-04 DIAGNOSIS — R5382 Chronic fatigue, unspecified: Secondary | ICD-10-CM | POA: Insufficient documentation

## 2017-01-04 DIAGNOSIS — G4733 Obstructive sleep apnea (adult) (pediatric): Secondary | ICD-10-CM

## 2017-01-04 DIAGNOSIS — G4761 Periodic limb movement disorder: Secondary | ICD-10-CM

## 2017-01-06 DIAGNOSIS — R0683 Snoring: Secondary | ICD-10-CM | POA: Diagnosis not present

## 2017-01-06 DIAGNOSIS — G4761 Periodic limb movement disorder: Secondary | ICD-10-CM

## 2017-01-06 NOTE — Procedures (Signed)
    Patient Name: Jennifer Park, Jennifer Park Date: 01/04/2017 Gender: Female D.O.B: 1961/03/19 Age (years): 26 Referring Provider: Tanda Rockers Height (inches): 20 Interpreting Physician: Chesley Mires MD, ABSM Weight (lbs): 270 RPSGT: Jonna Coup BMI: 41 MRN: [id number] Neck Size: 17.00  CLINICAL INFORMATION Sleep Study Type: NPSG  Indication for sleep study: Excessive Daytime Sleepiness, Fatigue, Morning Headaches, Obesity, Snoring  Epworth Sleepiness Score: 10  SLEEP STUDY TECHNIQUE As per the AASM Manual for the Scoring of Sleep and Associated Events v2.3 (April 2016) with a hypopnea requiring 4% desaturations.  The channels recorded and monitored were frontal, central and occipital EEG, electrooculogram (EOG), submentalis EMG (chin), nasal and oral airflow, thoracic and abdominal wall motion, anterior tibialis EMG, snore microphone, electrocardiogram, and pulse oximetry.  MEDICATIONS Medications self-administered by patient taken the night of the study : N/A  SLEEP ARCHITECTURE The study was initiated at 10:19:40 PM and ended at 4:30:34 AM.  Sleep onset time was 37.0 minutes and the sleep efficiency was 60.5%. The total sleep time was 224.4 minutes.  Stage REM latency was 199.5 minutes.  The patient spent 4.23% of the night in stage N1 sleep, 93.32% in stage N2 sleep, 0.45% in stage N3 and 2.01% in REM.  Alpha intrusion was absent.  Supine sleep was 4.68%.  RESPIRATORY PARAMETERS The overall apnea/hypopnea index (AHI) was 1.6 per hour. There were 0 total apneas, including 0 obstructive, 0 central and 0 mixed apneas. There were 6 hypopneas and 1 RERAs.  The AHI during Stage REM sleep was 66.7 per hour.  AHI while supine was 0.0 per hour.  The mean oxygen saturation was 92.47%. The minimum SpO2 during sleep was 90.00%.  soft snoring was noted during this study.  CARDIAC DATA The 2 lead EKG demonstrated sinus rhythm. The mean heart rate was 76.18 beats per  minute. Other EKG findings include: None.  LEG MOVEMENT DATA The total PLMS were 371 with a resulting PLMS index of 99.19. Associated arousal with leg movement index was 11.0 .  IMPRESSIONS - No significant obstructive sleep apnea occurred during this study (AHI = 1.6/h). - No significant central sleep apnea occurred during this study (CAI = 0.0/h). - The patient had minimal or no oxygen desaturation during the study (Min O2 = 90.00%) - The patient snored with soft snoring volume. - No cardiac abnormalities were noted during this study. - Severe periodic limb movements of sleep occurred during the study. Associated arousals were significant.  DIAGNOSIS - Snoring (R06.83) - Periodic limb movements of sleep (G47.61)  RECOMMENDATIONS - She should be assessed for the presence of restless leg syndrome.  [Electronically signed] 01/06/2017 01:44 PM  Chesley Mires MD, [place], American Board of Sleep Medicine   NPI: [id number]

## 2017-01-07 NOTE — Progress Notes (Signed)
Let her know study was neg for osa but pos for possible RLS so needs:  rec check ferritin and if > 50 then refer to neurology

## 2017-01-08 ENCOUNTER — Telehealth: Payer: Self-pay | Admitting: *Deleted

## 2017-01-08 NOTE — Telephone Encounter (Signed)
LMTCB

## 2017-01-08 NOTE — Telephone Encounter (Signed)
-----   Message from Tanda Rockers, MD sent at 01/07/2017  9:49 AM EDT -----  rec check ferritin and if > 50 then refer to neurology

## 2017-01-09 NOTE — Telephone Encounter (Signed)
Left message for pt to call us back x2

## 2017-01-12 ENCOUNTER — Telehealth: Payer: Self-pay | Admitting: Internal Medicine

## 2017-01-12 DIAGNOSIS — G9332 Myalgic encephalomyelitis/chronic fatigue syndrome: Secondary | ICD-10-CM

## 2017-01-12 DIAGNOSIS — R5382 Chronic fatigue, unspecified: Secondary | ICD-10-CM

## 2017-01-12 NOTE — Telephone Encounter (Signed)
Pt returned phone call, pt contact # [phone]

## 2017-01-12 NOTE — Telephone Encounter (Signed)
Advised pt of rec's from [facility]. Ferritin test placed and she will come in on Friday to have this done. Nothing further is needed.

## 2017-01-12 NOTE — Telephone Encounter (Signed)
Rosana Berger, CMA      2:19 PM  Note    ----- Message from Tanda Rockers, MD sent at 01/07/2017  9:49 AM EDT -----  rec check ferritin and if > 50 then refer to neurology      Left message for patient to call back.

## 2017-01-12 NOTE — Telephone Encounter (Signed)
See PN dated 01/12/17

## 2017-01-16 ENCOUNTER — Telehealth: Payer: Self-pay | Admitting: Internal Medicine

## 2017-01-16 ENCOUNTER — Other Ambulatory Visit (INDEPENDENT_AMBULATORY_CARE_PROVIDER_SITE_OTHER): Payer: Managed Care, Other (non HMO)

## 2017-01-16 DIAGNOSIS — G9332 Myalgic encephalomyelitis/chronic fatigue syndrome: Secondary | ICD-10-CM

## 2017-01-16 DIAGNOSIS — R5382 Chronic fatigue, unspecified: Secondary | ICD-10-CM | POA: Diagnosis not present

## 2017-01-16 LAB — FERRITIN: FERRITIN: 30.1 ng/mL (ref 10.0–291.0)

## 2017-01-16 NOTE — Progress Notes (Signed)
Spoke with pt and notified of results per Dr. Wert. Pt verbalized understanding and denied any questions. 

## 2017-01-16 NOTE — Telephone Encounter (Signed)
Call patient : Study is c/w low fe causing restless legs so rec fes04 325 tid X one month then ov    Called and spoke with pt and she is aware of MW recs of the iron tablets and she will call back for appt with MW in 1 month

## 2017-01-26 ENCOUNTER — Encounter: Payer: Self-pay | Admitting: Internal Medicine

## 2017-01-27 ENCOUNTER — Other Ambulatory Visit: Payer: Self-pay | Admitting: Internal Medicine

## 2017-01-27 MED ORDER — DULOXETINE HCL 30 MG PO CPEP
30.0000 mg | ORAL_CAPSULE | Freq: Every day | ORAL | 1 refills | Status: DC
Start: 2017-01-27 — End: 2017-07-27

## 2017-01-27 NOTE — Telephone Encounter (Signed)
Pt called back checking on this  °

## 2017-04-13 ENCOUNTER — Encounter: Payer: Self-pay | Admitting: Internal Medicine

## 2017-04-13 ENCOUNTER — Ambulatory Visit: Payer: 59 | Admitting: Internal Medicine

## 2017-04-13 VITALS — BP 144/90 | HR 79 | Temp 97.7°F | Resp 18 | Wt 280.0 lb

## 2017-04-13 DIAGNOSIS — J4521 Mild intermittent asthma with (acute) exacerbation: Secondary | ICD-10-CM

## 2017-04-13 DIAGNOSIS — J0100 Acute maxillary sinusitis, unspecified: Secondary | ICD-10-CM

## 2017-04-13 MED ORDER — CEFDINIR 300 MG PO CAPS: 300.0000 mg | ORAL_CAPSULE | Freq: Two times a day (BID) | ORAL | 0 refills | Status: DC

## 2017-04-13 MED ORDER — BENZONATATE 200 MG PO CAPS: 200.0000 mg | ORAL_CAPSULE | Freq: Three times a day (TID) | ORAL | 0 refills | Status: DC | PRN

## 2017-04-13 NOTE — Progress Notes (Signed)
Subjective:    Patient ID: Jennifer Park, female    DOB: [birth year], 57 y.o.   MRN: [id number]  HPI She is here for an acute visit for cold symptoms.  Her symptoms started about one week ago.    She is experiencing nasal congestion and discolored nasal discharge, ear pain, sinus pain, sore throat, productive cough, wheeze, sob, headaches and lightheadedness/dizziness.  She has body aches.  She had subjective fevers in the beginning of the illness.    She has taken tylenol, nyquil, dayquil.  She is using her albuterol and it is helping a little.    Her husband and son are both sick.   Medications and allergies reviewed with patient and updated if appropriate.  Patient Active Problem List   Diagnosis Date Noted  . Snoring 01/06/2017  . Periodic limb movements of sleep 01/06/2017  . Morbid obesity due to excess calories ([place]) 10/02/2016  . Hyperglycemia 08/06/2016  . Cough variant asthma  vs UACS/ vcd 08/06/2016  . Dyspnea on exertion 08/06/2016  . Loose stools 08/06/2016  . Arthralgia 08/06/2016  . Chronic fatigue 08/06/2016  . GERD (gastroesophageal reflux disease) 05/23/2015  . DCIS (ductal carcinoma in situ) 03/14/2015  . Breast cancer of lower-outer quadrant of left female breast ([place]) 01/12/2015  . Adhesive capsulitis 11/23/2013  . Subacromial bursitis 11/23/2013  . Varicose veins 11/17/2013  . Edema 11/17/2013  . Hyperlipidemia 04/16/2010  . Elevated blood pressure reading without diagnosis of hypertension 04/16/2010  . Anxiety 12/15/2006  . DYSFUNCTION, BLADDER NEC 12/15/2006    Current Outpatient Medications on File Prior to Visit  Medication Sig Dispense Refill  . albuterol (PROAIR HFA) 108 (90 Base) MCG/ACT inhaler 2 puffs every 4 hours as needed only  if your can't catch your breath 1 Inhaler 1  . aspirin-acetaminophen-caffeine (EXCEDRIN MIGRAINE) 250-250-65 MG tablet Take 2 tablets by mouth every 6 (six) hours as needed for headache.    .  budesonide-formoterol (SYMBICORT) 80-4.5 MCG/ACT inhaler Inhale 2 puffs into the lungs 2 (two) times daily. 1 Inhaler 12  . cetirizine (ZYRTEC) 10 MG tablet Take 10 mg by mouth daily.    . DULoxetine (CYMBALTA) 30 MG capsule Take 1 capsule (30 mg total) by mouth daily. 90 capsule 1  . famotidine (PEPCID) 20 MG tablet One at bedtime 30 tablet 2  . furosemide (LASIX) 20 MG tablet Take 1 tablet (20 mg total) by mouth daily as needed for fluid or edema. 90 tablet 3  . pantoprazole (PROTONIX) 40 MG tablet TAKE 1 TABLET (40 MG TOTAL) BY MOUTH DAILY. TAKE 30-60 MIN BEFORE FIRST MEAL OF THE DAY 30 tablet 2  . [DISCONTINUED] TOVIAZ 4 MG TB24 TAKE 1 TABLET BY MOUTH EVERY DAY 30 tablet 5   No current facility-administered medications on file prior to visit.     Past Medical History:  Diagnosis Date  . Allergy   . Anemia    history of anemia 5 yrs.  . Anxiety    situational  . Asthma    MILD / RARE  . Breast cancer of lower-outer quadrant of left female breast ([place]) 01/12/2015  . Claustrophobia   . Claustrophobia   . Depression   . GERD (gastroesophageal reflux disease)   . Hyperlipidemia   . Hypertension    history of HTN and on meds til normal- no meds currently  . PONV (postoperative nausea and vomiting)    wasn't sure if it was related to morphine or epidural  . Shortness of breath  dyspnea    on exertion    Past Surgical History:  Procedure Laterality Date  . ABDOMINAL HYSTERECTOMY     for abnormal cytology; Dr Irven [place], ovaries left  . CESAREAN SECTION    . GANGLION CYST EXCISION    . MASTECTOMY W/ SENTINEL NODE BIOPSY Bilateral 03/14/2015  . MASTECTOMY W/ SENTINEL NODE BIOPSY Bilateral 03/14/2015   Procedure: BILATERAL MASTECTOMY WITH LEFT SENTINEL LYMPH NODE BIOPSY;  Surgeon: [name], MD;  Location: [facility];  Service: General;  Laterality: Bilateral;  . no colonoscopy     11/17/13 [place] reviewed  . ROBOTIC ASSISTED BILATERAL SALPINGO OOPHERECTOMY Bilateral 10/16/2015    Procedure: XI ROBOTIC ASSISTED BILATERAL SALPINGO OOPHORECTOMY ;  Surgeon: Everitt Amber, MD;  Location: [facility];  Service: Gynecology;  Laterality: Bilateral;  . TONSILLECTOMY    . TONSILLECTOMY    . VARICOSE VEIN SURGERY     as OP X2    Social History   Socioeconomic History  . Marital status: Married    Spouse name: Not on file  . Number of children: Not on file  . Years of education: Not on file  . Highest education level: Not on file  Social Needs  . Financial resource strain: Not on file  . Food insecurity - worry: Not on file  . Food insecurity - inability: Not on file  . Transportation needs - medical: Not on file  . Transportation needs - non-medical: Not on file  Occupational History  . Not on file  Tobacco Use  . Smoking status: Former Smoker    Packs/day: 1.00    Years: 4.00    Pack years: 4.00    Types: Cigarettes    Last attempt to quit: 03/31/1984    Years since quitting: 33.0  . Smokeless tobacco: Never Used  . Tobacco comment: age [age] , up to 1 ppd  Substance and Sexual Activity  . Alcohol use: Yes    Alcohol/week: 0.6 oz    Types: 1 Glasses of wine per week    Comment: OCCASIONAL  . Drug use: No  . Sexual activity: Not on file  Other Topics Concern  . Not on file  Social History Narrative  . Not on file    Family History  Problem Relation Age of Onset  . Bipolar disorder Mother   . Breast cancer Mother   . Cancer Mother   . Diabetes Father   . Kidney failure Father   . Heart failure Father   . COPD Father   . Heart disease Father   . Hyperlipidemia Father   . Bipolar disorder Sister   . Heart attack Maternal Grandfather        in late 10s  . Stroke Maternal Grandmother        in 90s    Review of Systems  Constitutional: Negative for chills and fever (none now, subjective earlier).  HENT: Positive for congestion (discolored mucus), ear pain, sinus pressure, sinus pain and sore throat.        Teeth pain  Eyes: Positive for redness (left  eye - irritated). Negative for photophobia, pain, discharge and itching.  Respiratory: Positive for cough (productive), shortness of breath and wheezing.   Gastrointestinal: Negative for diarrhea and nausea.  Musculoskeletal: Positive for myalgias.  Neurological: Positive for dizziness (mild), light-headedness and headaches.       Objective:   Vitals:   04/13/17 1313  BP: (!) 144/90  Pulse: 79  Resp: 18  Temp: 97.7 F (36.5 C)  SpO2: 98%   Filed Weights   04/13/17 1313  Weight: 280 lb (127 kg)   Body mass index is 42.57 kg/m.  Wt Readings from Last 3 Encounters:  04/13/17 280 lb (127 kg)  01/04/17 270 lb (122.5 kg)  11/10/16 281 lb 6.4 oz (127.6 kg)     Physical Exam GENERAL APPEARANCE: Appears stated age, well appearing, NAD EYES: conjunctiva clear on right, mild erythema on left, no icterus HEENT: bilateral tympanic membranes and ear canals normal, oropharynx with mild erythema, nasal congestion, no thyromegaly, trachea midline, no cervical or supraclavicular lymphadenopathy LUNGS: Clear to auscultation without wheeze or crackles, unlabored breathing, good air entry bilaterally CARDIOVASCULAR: Normal S1,S2 without murmurs, no edema SKIN: warm, dry      Assessment & Plan:   See Problem List for Assessment and Plan of chronic medical problems.

## 2017-04-13 NOTE — Patient Instructions (Addendum)
Take the antibiotic as prescribed.  Use the cough pills as needed.  Start taking advil or aleve.  Continue increased rest and fluids.    Call if no improvement

## 2017-04-13 NOTE — Assessment & Plan Note (Addendum)
Symptoms c/w sinus infection - bacterial in nature Start omnicef Continue cold medications Start advil or aleve Tessalon perles Call if no improvement

## 2017-04-13 NOTE — Assessment & Plan Note (Signed)
Mild exacerbation - still clear on exam Hold off on steroids - she will call if symptoms do not improve Taking symbicort daily - increase to twice daily Tessalon perles omnicef for possible early bacterial bronchitis otc cold medications  Call if no improvement

## 2017-04-23 ENCOUNTER — Other Ambulatory Visit: Payer: Self-pay | Admitting: Internal Medicine

## 2017-05-27 ENCOUNTER — Other Ambulatory Visit: Payer: Self-pay | Admitting: Internal Medicine

## 2017-06-10 ENCOUNTER — Encounter: Payer: Self-pay | Admitting: Internal Medicine

## 2017-06-11 ENCOUNTER — Ambulatory Visit (INDEPENDENT_AMBULATORY_CARE_PROVIDER_SITE_OTHER): Payer: Self-pay | Admitting: Internal Medicine

## 2017-06-11 ENCOUNTER — Encounter: Payer: Self-pay | Admitting: Internal Medicine

## 2017-06-11 VITALS — BP 122/82 | HR 98 | Temp 98.0°F | Ht 68.0 in | Wt 276.0 lb

## 2017-06-11 DIAGNOSIS — N390 Urinary tract infection, site not specified: Secondary | ICD-10-CM | POA: Insufficient documentation

## 2017-06-11 DIAGNOSIS — R300 Dysuria: Secondary | ICD-10-CM

## 2017-06-11 DIAGNOSIS — N3000 Acute cystitis without hematuria: Secondary | ICD-10-CM

## 2017-06-11 LAB — POCT URINALYSIS DIPSTICK
Glucose, UA: NEGATIVE
Nitrite, UA: NEGATIVE
RBC UA: 10
SPEC GRAV UA: 1.025 (ref 1.010–1.025)
Urobilinogen, UA: 1 E.U./dL
pH, UA: 6 (ref 5.0–8.0)

## 2017-06-11 MED ORDER — SULFAMETHOXAZOLE-TRIMETHOPRIM 800-160 MG PO TABS: 1.0000 | ORAL_TABLET | Freq: Two times a day (BID) | ORAL | 0 refills | Status: DC

## 2017-06-11 NOTE — Progress Notes (Signed)
   Subjective:    Patient ID: Jennifer Park, female    DOB: 1960-09-19, 57 y.o.   MRN: [id number]  HPI The patient is a 57 YO female coming in for UTI symptoms. She feels that taking cefdinir gave her the UTI but it had been mild. Just has not been going away. She is having burning and urgency with urination. Going on for 3-4 days. Overall worsening. Has not tried anything for it.   Review of Systems  Constitutional: Negative.   Respiratory: Negative for cough, chest tightness and shortness of breath.   Cardiovascular: Negative for chest pain, palpitations and leg swelling.  Gastrointestinal: Negative for abdominal distention, abdominal pain, constipation, diarrhea, nausea and vomiting.  Genitourinary: Positive for dysuria and frequency. Negative for difficulty urinating, genital sores, pelvic pain, urgency, vaginal bleeding and vaginal discharge.  Musculoskeletal: Negative.   Skin: Negative.   Neurological: Negative.       Objective:   Physical Exam  Constitutional: She is oriented to person, place, and time. She appears well-developed and well-nourished.  HENT:  Head: Normocephalic and atraumatic.  Eyes: EOM are normal.  Neck: Normal range of motion.  Cardiovascular: Normal rate and regular rhythm.  Pulmonary/Chest: Effort normal and breath sounds normal. No respiratory distress. She has no wheezes. She has no rales.  Abdominal: Soft. Bowel sounds are normal. She exhibits no distension. There is no tenderness. There is no rebound.  Musculoskeletal: She exhibits no edema.  Neurological: She is alert and oriented to person, place, and time. Coordination normal.  Skin: Skin is warm and dry.  Psychiatric: She has a normal mood and affect.   Vitals:   06/11/17 1444  BP: 122/82  Pulse: 98  Temp: 98 F (36.7 C)  TempSrc: Oral  SpO2: 97%  Weight: 276 lb (125.2 kg)  Height: 5\' 8"  (1.727 m)      Assessment & Plan:

## 2017-06-11 NOTE — Patient Instructions (Signed)
We have sent in bactrim. Take 1 pill twice a day for 5 days.   Call us back if any problems.

## 2017-06-11 NOTE — Assessment & Plan Note (Signed)
U/A done in the office consistent with UTI. Rx for bactrim 5 day course.

## 2017-06-18 ENCOUNTER — Telehealth: Payer: Self-pay | Admitting: Physician Assistant

## 2017-06-18 DIAGNOSIS — J069 Acute upper respiratory infection, unspecified: Secondary | ICD-10-CM

## 2017-06-18 NOTE — Progress Notes (Signed)
Based on what you shared with me it looks like you have a condition that should be evaluated in a face to face office visit giving that you are having a recurrence of respiratory symptoms after antibiotic treatments. I really recommend a good physical examination of the lungs and potential imaging.  NOTE: If you entered your credit card information for this eVisit, you will not be charged. You may see a "hold" on your card for the $30 but that hold will drop off and you will not have a charge processed.  If you are having a true medical emergency please call 911.  If you need an urgent face to face visit, [facility] has four urgent care centers for your convenience.  If you need care fast and have a high deductible or no insurance consider:   [email or web address] to reserve your spot online an avoid wait times  [facility] [address] [place], [place] [address] 8 am to 8 pm Monday-Friday 10 am to 4 pm Saturday-Sunday *Across the street from [employer]  [place], [address] 8 am to 5 pm Monday-Friday * In the [facility] on the [facility]   The following sites will take your  insurance:  . [facility] [place] a Provider at this Location  [address] [place], [place] [address] . 10 am to 8 pm Monday-Friday . 12 pm to 8 pm Saturday-Sunday   . [facility] at [place] a Provider at this Location  [place] [address], [place], [place] [address] . 8 am to 8 pm Monday-Friday . 9 am to 6 pm Saturday . 11 am to 6 pm Sunday   . [facility] at [place] Get Driving Directions  [address], [place] [address] . 8 am to 8 pm Monday-Friday . 8 am to 4 pm Saturday-Sunday   Your e-visit answers were reviewed by a  board certified advanced clinical practitioner to complete your personal care plan.  Thank you for using e-Visits.

## 2017-06-21 NOTE — Progress Notes (Signed)
Subjective:    Patient ID: Jennifer Park, female    DOB: 02/23/1961, 57 y.o.   MRN: [id number]  HPI She is here for an acute visit for cold symptoms.  Her symptoms started 10 days ago.    She is experiencing a productive cough with green sputum, shortness of breath and wheezing.  She also states fatigue and headaches.  She denies any other cold symptoms, fever, chills, GI symptoms and body aches.  She has asthma and has been using her Symbicort twice daily as prescribed.  She has taken dayquil and nyquil.    Medications and allergies reviewed with patient and updated if appropriate.  Patient Active Problem List   Diagnosis Date Noted  . UTI (urinary tract infection) 06/11/2017  . Acute non-recurrent maxillary sinusitis 04/13/2017  . Mild intermittent asthma with exacerbation 04/13/2017  . Snoring 01/06/2017  . Periodic limb movements of sleep 01/06/2017  . Morbid obesity due to excess calories ([place]) 10/02/2016  . Hyperglycemia 08/06/2016  . Cough variant asthma  vs UACS/ vcd 08/06/2016  . Dyspnea on exertion 08/06/2016  . Loose stools 08/06/2016  . Arthralgia 08/06/2016  . Chronic fatigue 08/06/2016  . GERD (gastroesophageal reflux disease) 05/23/2015  . DCIS (ductal carcinoma in situ) 03/14/2015  . Breast cancer of lower-outer quadrant of left female breast ([place]) 01/12/2015  . Adhesive capsulitis 11/23/2013  . Subacromial bursitis 11/23/2013  . Varicose veins 11/17/2013  . Edema 11/17/2013  . Hyperlipidemia 04/16/2010  . Elevated blood pressure reading without diagnosis of hypertension 04/16/2010  . Anxiety 12/15/2006  . DYSFUNCTION, BLADDER NEC 12/15/2006    Current Outpatient Medications on File Prior to Visit  Medication Sig Dispense Refill  . albuterol (PROAIR HFA) 108 (90 Base) MCG/ACT inhaler 2 puffs every 4 hours as needed only  if your can't catch your breath 1 Inhaler 1  . aspirin-acetaminophen-caffeine (EXCEDRIN MIGRAINE) 250-250-65 MG tablet Take 2 tablets  by mouth every 6 (six) hours as needed for headache.    . budesonide-formoterol (SYMBICORT) 80-4.5 MCG/ACT inhaler Inhale 2 puffs into the lungs 2 (two) times daily. 1 Inhaler 12  . cetirizine (ZYRTEC) 10 MG tablet Take 10 mg by mouth daily.    . DULoxetine (CYMBALTA) 30 MG capsule Take 1 capsule (30 mg total) by mouth daily. 90 capsule 1  . famotidine (PEPCID) 20 MG tablet One at bedtime 30 tablet 2  . furosemide (LASIX) 20 MG tablet Take 1 tablet (20 mg total) by mouth daily as needed for fluid or edema. 90 tablet 3  . pantoprazole (PROTONIX) 40 MG tablet TAKE 1 TABLET (40 MG TOTAL) BY MOUTH DAILY. TAKE 30-60 MIN BEFORE FIRST MEAL OF THE DAY 30 tablet 2  . [DISCONTINUED] TOVIAZ 4 MG TB24 TAKE 1 TABLET BY MOUTH EVERY DAY 30 tablet 5   No current facility-administered medications on file prior to visit.     Past Medical History:  Diagnosis Date  . Allergy   . Anemia    history of anemia 5 yrs.  . Anxiety    situational  . Asthma    MILD / RARE  . Breast cancer of lower-outer quadrant of left female breast ([place]) 01/12/2015  . Claustrophobia   . Claustrophobia   . Depression   . GERD (gastroesophageal reflux disease)   . Hyperlipidemia   . Hypertension    history of HTN and on meds til normal- no meds currently  . PONV (postoperative nausea and vomiting)    wasn't sure if it was related  to morphine or epidural  . Shortness of breath dyspnea    on exertion    Past Surgical History:  Procedure Laterality Date  . ABDOMINAL HYSTERECTOMY     for abnormal cytology; Dr Irven [place], ovaries left  . CESAREAN SECTION    . GANGLION CYST EXCISION    . MASTECTOMY W/ SENTINEL NODE BIOPSY Bilateral 03/14/2015  . MASTECTOMY W/ SENTINEL NODE BIOPSY Bilateral 03/14/2015   Procedure: BILATERAL MASTECTOMY WITH LEFT SENTINEL LYMPH NODE BIOPSY;  Surgeon: [name], MD;  Location: [facility];  Service: General;  Laterality: Bilateral;  . no colonoscopy     11/17/13 [place] reviewed  . ROBOTIC ASSISTED  BILATERAL SALPINGO OOPHERECTOMY Bilateral 10/16/2015   Procedure: XI ROBOTIC ASSISTED BILATERAL SALPINGO OOPHORECTOMY ;  Surgeon: Everitt Amber, MD;  Location: [facility];  Service: Gynecology;  Laterality: Bilateral;  . TONSILLECTOMY    . TONSILLECTOMY    . VARICOSE VEIN SURGERY     as OP X2    Social History   Socioeconomic History  . Marital status: Married    Spouse name: Not on file  . Number of children: Not on file  . Years of education: Not on file  . Highest education level: Not on file  Occupational History  . Not on file  Social Needs  . Financial resource strain: Not on file  . Food insecurity:    Worry: Not on file    Inability: Not on file  . Transportation needs:    Medical: Not on file    Non-medical: Not on file  Tobacco Use  . Smoking status: Former Smoker    Packs/day: 1.00    Years: 4.00    Pack years: 4.00    Types: Cigarettes    Last attempt to quit: 03/31/1984    Years since quitting: 33.2  . Smokeless tobacco: Never Used  . Tobacco comment: age [age] , up to 1 ppd  Substance and Sexual Activity  . Alcohol use: Yes    Alcohol/week: 0.6 oz    Types: 1 Glasses of wine per week    Comment: OCCASIONAL  . Drug use: No  . Sexual activity: Not on file  Lifestyle  . Physical activity:    Days per week: Not on file    Minutes per session: Not on file  . Stress: Not on file  Relationships  . Social connections:    Talks on phone: Not on file    Gets together: Not on file    Attends religious service: Not on file    Active member of club or organization: Not on file    Attends meetings of clubs or organizations: Not on file    Relationship status: Not on file  Other Topics Concern  . Not on file  Social History Narrative  . Not on file    Family History  Problem Relation Age of Onset  . Bipolar disorder Mother   . Breast cancer Mother   . Cancer Mother   . Diabetes Father   . Kidney failure Father   . Heart failure Father   . COPD Father   .  Heart disease Father   . Hyperlipidemia Father   . Bipolar disorder Sister   . Heart attack Maternal Grandfather        in late 82s  . Stroke Maternal Grandmother        in 90s    Review of Systems  Constitutional: Positive for fatigue. Negative for chills and fever.  HENT:  Negative for congestion, ear pain, sinus pressure, sinus pain and sore throat.   Respiratory: Positive for cough (productive of green sputum), shortness of breath and wheezing.   Gastrointestinal: Negative for diarrhea and nausea.  Genitourinary: Negative for dysuria, frequency and hematuria.  Musculoskeletal: Negative for myalgias.  Neurological: Positive for headaches. Negative for dizziness and light-headedness.       Objective:   Vitals:   06/22/17 1351  BP: 118/84  Pulse: 94  Resp: 16  Temp: 97.8 F (36.6 C)  SpO2: 94%   Filed Weights   06/22/17 1351  Weight: 277 lb (125.6 kg)   Body mass index is 42.12 kg/m.  Wt Readings from Last 3 Encounters:  06/22/17 277 lb (125.6 kg)  06/11/17 276 lb (125.2 kg)  04/13/17 280 lb (127 kg)     Physical Exam GENERAL APPEARANCE: Appears stated age, well appearing, NAD EYES: conjunctiva clear, no icterus HEENT: bilateral tympanic membranes and ear canals normal, oropharynx with mild erythema, no thyromegaly, trachea midline, no cervical or supraclavicular lymphadenopathy LUNGS: unlabored breathing, good air entry bilaterally, mild wheeze and crackles throughout lungs CARDIOVASCULAR: Normal S1,S2 without murmurs, no edema SKIN: warm, dry        Assessment & Plan:   See Problem List for Assessment and Plan of chronic medical problems.

## 2017-06-22 ENCOUNTER — Ambulatory Visit (INDEPENDENT_AMBULATORY_CARE_PROVIDER_SITE_OTHER): Payer: Self-pay | Admitting: Internal Medicine

## 2017-06-22 ENCOUNTER — Encounter: Payer: Self-pay | Admitting: Internal Medicine

## 2017-06-22 VITALS — BP 118/84 | HR 94 | Temp 97.8°F | Resp 16 | Wt 277.0 lb

## 2017-06-22 DIAGNOSIS — J209 Acute bronchitis, unspecified: Secondary | ICD-10-CM | POA: Insufficient documentation

## 2017-06-22 DIAGNOSIS — J4521 Mild intermittent asthma with (acute) exacerbation: Secondary | ICD-10-CM

## 2017-06-22 MED ORDER — PROMETHAZINE-DM 6.25-15 MG/5ML PO SYRP: 5.0000 mL | ORAL_SOLUTION | Freq: Four times a day (QID) | ORAL | 0 refills | Status: DC | PRN

## 2017-06-22 MED ORDER — CEFDINIR 300 MG PO CAPS: 300.0000 mg | ORAL_CAPSULE | Freq: Two times a day (BID) | ORAL | 0 refills | Status: DC

## 2017-06-22 NOTE — Assessment & Plan Note (Signed)
Symptoms consistent with bacterial bronchitis with asthma exacerbation Overall she looks good despite how her lungs sound She currently does not have insurance so I did order a chest x-ray, but she will only get this if her symptoms do not improve over the next few days Start Omnicef twice daily times 10 days Continue inhalers Prescription cough syrup Will prescribe steroids only if her symptoms do not improve over the next few days

## 2017-06-22 NOTE — Patient Instructions (Signed)
Take the antibiotic as prescribed.  Use the cough syrup or delsym cough syrup.   Rest, fluids.  Call if no improvement     Get a chest x-ray if no improvement.

## 2017-06-22 NOTE — Assessment & Plan Note (Signed)
Mild asthma exacerbation related to acute bronchitis Continue Symbicort twice daily, albuterol as needed We will hold off on steroids unless her symptoms do not improve Omnicef, prescription cough syrup Chest x-ray ordered-she will have this done only if her symptoms do not improve  Call if no improvement

## 2017-06-27 ENCOUNTER — Ambulatory Visit (INDEPENDENT_AMBULATORY_CARE_PROVIDER_SITE_OTHER): Payer: Self-pay

## 2017-06-27 ENCOUNTER — Other Ambulatory Visit: Payer: Self-pay

## 2017-06-27 ENCOUNTER — Encounter (HOSPITAL_COMMUNITY): Payer: Self-pay | Admitting: Emergency Medicine

## 2017-06-27 ENCOUNTER — Ambulatory Visit (HOSPITAL_COMMUNITY)
Admission: EM | Admit: 2017-06-27 | Discharge: 2017-06-27 | Disposition: A | Discharge status: Home / Self Care | Payer: Self-pay | Attending: Family Medicine | Admitting: Family Medicine

## 2017-06-27 DIAGNOSIS — J45991 Cough variant asthma: Secondary | ICD-10-CM

## 2017-06-27 DIAGNOSIS — J181 Lobar pneumonia, unspecified organism: Secondary | ICD-10-CM

## 2017-06-27 DIAGNOSIS — J189 Pneumonia, unspecified organism: Secondary | ICD-10-CM

## 2017-06-27 MED ORDER — AEROCHAMBER PLUS FLO-VU LARGE MISC
Status: AC
  Filled 2017-06-27: qty 1

## 2017-06-27 MED ORDER — ALBUTEROL SULFATE HFA 108 (90 BASE) MCG/ACT IN AERS: 1.0000 | INHALATION_SPRAY | RESPIRATORY_TRACT | 1 refills | Status: DC | PRN

## 2017-06-27 MED ORDER — LEVOFLOXACIN 750 MG PO TABS: 750.0000 mg | ORAL_TABLET | Freq: Every day | ORAL | 0 refills | Status: AC

## 2017-06-27 MED ORDER — AEROCHAMBER PLUS FLO-VU MEDIUM MISC
1.0000 | Freq: Once | Status: AC
  Administered 2017-06-27: 1

## 2017-06-27 NOTE — ED Provider Notes (Signed)
[facility]    CSN: [id number] Arrival date & time: 06/27/17  1604     History   Chief Complaint Chief Complaint  Patient presents with  . Bronchitis    HPI Jennifer Park is a 57 y.o. female.   57 year old female comes in for worsening cough after seeing her PCP for bronchitis 5 days ago.  Current episode has been going on for more than 2 weeks.  She was started on Omnicef during her PCP visit,  states symptoms significantly improved for 2-3 days, then for the past 2 days has had worsening productive cough, shortness of breath, wheezing. Denies fever, chills, night sweats.  States she sometimes forgets to use her inhaler, and when she does use her, symptoms improved greatly.  She is a former smoker.     Past Medical History:  Diagnosis Date  . Allergy   . Anemia    history of anemia 5 yrs.  . Anxiety    situational  . Asthma    MILD / RARE  . Breast cancer of lower-outer quadrant of left female breast ([place]) 01/12/2015  . Claustrophobia   . Claustrophobia   . Depression   . GERD (gastroesophageal reflux disease)   . Hyperlipidemia   . Hypertension    history of HTN and on meds til normal- no meds currently  . PONV (postoperative nausea and vomiting)    wasn't sure if it was related to morphine or epidural  . Shortness of breath dyspnea    on exertion    Patient Active Problem List   Diagnosis Date Noted  . Acute bronchitis 06/22/2017  . UTI (urinary tract infection) 06/11/2017  . Acute non-recurrent maxillary sinusitis 04/13/2017  . Mild intermittent asthma with exacerbation 04/13/2017  . Snoring 01/06/2017  . Periodic limb movements of sleep 01/06/2017  . Morbid obesity due to excess calories ([place]) 10/02/2016  . Hyperglycemia 08/06/2016  . Cough variant asthma  vs UACS/ vcd 08/06/2016  . Dyspnea on exertion 08/06/2016  . Loose stools 08/06/2016  . Arthralgia 08/06/2016  . Chronic fatigue 08/06/2016  . GERD (gastroesophageal reflux disease)  05/23/2015  . DCIS (ductal carcinoma in situ) 03/14/2015  . Breast cancer of lower-outer quadrant of left female breast ([place]) 01/12/2015  . Adhesive capsulitis 11/23/2013  . Subacromial bursitis 11/23/2013  . Varicose veins 11/17/2013  . Edema 11/17/2013  . Hyperlipidemia 04/16/2010  . Elevated blood pressure reading without diagnosis of hypertension 04/16/2010  . Anxiety 12/15/2006  . DYSFUNCTION, BLADDER NEC 12/15/2006    Past Surgical History:  Procedure Laterality Date  . ABDOMINAL HYSTERECTOMY     for abnormal cytology; Dr Irven [place], ovaries left  . CESAREAN SECTION    . GANGLION CYST EXCISION    . MASTECTOMY W/ SENTINEL NODE BIOPSY Bilateral 03/14/2015  . MASTECTOMY W/ SENTINEL NODE BIOPSY Bilateral 03/14/2015   Procedure: BILATERAL MASTECTOMY WITH LEFT SENTINEL LYMPH NODE BIOPSY;  Surgeon: [name], MD;  Location: [facility];  Service: General;  Laterality: Bilateral;  . no colonoscopy     11/17/13 [place] reviewed  . ROBOTIC ASSISTED BILATERAL SALPINGO OOPHERECTOMY Bilateral 10/16/2015   Procedure: XI ROBOTIC ASSISTED BILATERAL SALPINGO OOPHORECTOMY ;  Surgeon: Everitt Amber, MD;  Location: [facility];  Service: Gynecology;  Laterality: Bilateral;  . TONSILLECTOMY    . TONSILLECTOMY    . VARICOSE VEIN SURGERY     as OP X2    OB History   None      Home Medications  Prior to Admission medications   Medication Sig Start Date End Date Taking? Authorizing Provider  aspirin-acetaminophen-caffeine (EXCEDRIN MIGRAINE) [phone] MG tablet Take 2 tablets by mouth every 6 (six) hours as needed for headache.   Yes [provider]  budesonide-formoterol (SYMBICORT) 80-4.5 MCG/ACT inhaler Inhale 2 puffs into the lungs 2 (two) times daily. 08/06/16  Yes Burns, Claudina Lick, MD  cefdinir (OMNICEF) 300 MG capsule Take 1 capsule (300 mg total) by mouth 2 (two) times daily. 06/22/17  Yes Burns, Claudina Lick, MD  cetirizine (ZYRTEC) 10 MG tablet Take 10 mg by mouth daily.   Yes [provider]  DULoxetine (CYMBALTA) 30 MG capsule Take 1 capsule (30 mg total) by mouth daily. 01/27/17  Yes Binnie Rail, MD  famotidine (PEPCID) 20 MG tablet One at bedtime 10/02/16  Yes Tanda Rockers, MD  furosemide (LASIX) 20 MG tablet Take 1 tablet (20 mg total) by mouth daily as needed for fluid or edema. 08/06/16  Yes Burns, Claudina Lick, MD  pantoprazole (PROTONIX) 40 MG tablet TAKE 1 TABLET (40 MG TOTAL) BY MOUTH DAILY. TAKE 30-60 MIN BEFORE FIRST MEAL OF THE DAY 04/23/17  Yes Tanda Rockers, MD  albuterol (PROAIR HFA) 108 (90 Base) MCG/ACT inhaler Inhale 1-2 puffs into the lungs every 4 (four) hours as needed for wheezing or shortness of breath. 06/27/17   Tasia Catchings, Bernal Luhman V, PA-C  levofloxacin (LEVAQUIN) 750 MG tablet Take 1 tablet (750 mg total) by mouth daily for 5 days. 06/27/17 07/02/17  Ok Edwards, PA-C  promethazine-dextromethorphan (PROMETHAZINE-DM) 6.25-15 MG/5ML syrup Take 5 mLs by mouth 4 (four) times daily as needed for cough. 06/22/17   Binnie Rail, MD  TOVIAZ 4 MG TB24 TAKE 1 TABLET BY MOUTH EVERY DAY 07/26/11 01/26/12  Hendricks Limes, MD    Family History Family History  Problem Relation Age of Onset  . Bipolar disorder Mother   . Breast cancer Mother   . Cancer Mother   . Diabetes Father   . Kidney failure Father   . Heart failure Father   . COPD Father   . Heart disease Father   . Hyperlipidemia Father   . Bipolar disorder Sister   . Heart attack Maternal Grandfather        in late 87s  . Stroke Maternal Grandmother        in 13s    Social History Social History   Tobacco Use  . Smoking status: Former Smoker    Packs/day: 1.00    Years: 4.00    Pack years: 4.00    Types: Cigarettes    Last attempt to quit: 03/31/1984    Years since quitting: 33.2  . Smokeless tobacco: Never Used  . Tobacco comment: age [age] , up to 1 ppd  Substance Use Topics  . Alcohol use: Yes    Alcohol/week: 0.6 oz    Types: 1 Glasses of wine per week    Comment: OCCASIONAL  . Drug  use: No     Allergies   Codeine and Morphine   Review of Systems Review of Systems  Reason unable to perform ROS: See HPI as above.     Physical Exam Triage Vital Signs ED Triage Vitals  Enc Vitals Group     BP 06/27/17 1634 134/83     Pulse Rate 06/27/17 1634 87     Resp --      Temp 06/27/17 1634 98.7 F (37.1 C)     Temp Source 06/27/17  1634 Oral     SpO2 06/27/17 1634 97 %     Weight --      Height --      Head Circumference --      Peak Flow --      Pain Score 06/27/17 1631 0     Pain Loc --      Pain Edu? --      Excl. in [place]? --    No data found.  Updated Vital Signs BP 134/83 (BP Location: Left Arm)   Pulse 87   Temp 98.7 F (37.1 C) (Oral)   SpO2 97%   Physical Exam  Constitutional: She is oriented to person, place, and time. She appears well-developed and well-nourished. No distress.  HENT:  Head: Normocephalic and atraumatic.  Right Ear: Tympanic membrane, external ear and ear canal normal. Tympanic membrane is not erythematous and not bulging.  Left Ear: Tympanic membrane, external ear and ear canal normal. Tympanic membrane is not erythematous and not bulging.  Nose: Nose normal. Right sinus exhibits no maxillary sinus tenderness and no frontal sinus tenderness. Left sinus exhibits no maxillary sinus tenderness and no frontal sinus tenderness.  Mouth/Throat: Uvula is midline, oropharynx is clear and moist and mucous membranes are normal.  Eyes: Pupils are equal, round, and reactive to light. Conjunctivae are normal.  Neck: Normal range of motion. Neck supple.  Cardiovascular: Normal rate, regular rhythm and normal heart sounds. Exam reveals no gallop and no friction rub.  No murmur heard. Pulmonary/Chest: Effort normal.  Crackles to the left middle and lower field.  Does not resolved with cough.  No obvious wheezing.  Lymphadenopathy:    She has no cervical adenopathy.  Neurological: She is alert and oriented to person, place, and time.  Skin:  Skin is warm and dry.  Psychiatric: She has a normal mood and affect. Her behavior is normal. Judgment normal.     UC Treatments / Results  Labs (all labs ordered are listed, but only abnormal results are displayed) Labs Reviewed - No data to display  EKG None Radiology Dg Chest 2 View  Result Date: 06/27/2017 CLINICAL DATA:  Productive cough. EXAM: CHEST - 2 VIEW COMPARISON:  10/02/2016 FINDINGS: Airspace disease within the SUPERIOR segment of the LEFT LOWER lobe likely represents pneumonia. The cardiomediastinal silhouette is unchanged. No pleural effusion, pneumothorax, mass or pulmonary edema. No acute bony abnormalities are present. IMPRESSION: LEFT LOWER lobe airspace disease likely representing pneumonia. Radiographic follow-up to resolution recommended. Electronically Signed   By: Margarette Canada M.D.   On: 06/27/2017 18:29    Procedures Procedures (including critical care time)  Medications Ordered in UC Medications  AEROCHAMBER PLUS FLO-VU MEDIUM MISC 1 each (1 each Other Provided for home use 06/27/17 1852)     Initial Impression / Assessment and Plan / UC Course  I have reviewed the triage vital signs and the nursing notes.  Pertinent labs & imaging results that were available during my care of the patient were reviewed by me and considered in my medical decision making (see chart for details).    Discussed x-ray results with patient.  Given worsening symptoms on Omnicef, will switch to Levaquin.  Patient to continue inhalers as directed.  Strict return precautions given. Otherwise, follow up with PCP in 7-10 days for recheck. Patient expresses understanding and agrees to plan.  Case discussed with Dr. Mannie Stabile, who agrees to plan.  Final Clinical Impressions(s) / UC Diagnoses   Final diagnoses:  Pneumonia of left lower  lobe due to infectious organism [facility])    ED Discharge Orders        Ordered    levofloxacin (LEVAQUIN) 750 MG tablet  Daily     06/27/17 1846     albuterol (PROAIR HFA) 108 (90 Base) MCG/ACT inhaler  Every 4 hours PRN     06/27/17 1848        Ok Edwards, PA-C 06/27/17 1902

## 2017-06-27 NOTE — Discharge Instructions (Addendum)
Discontinue Cefdinir, start Levaquin as directed.  Continue maintenance inhalers as directed.  Continue albuterol inhaler every 4-6 hours as needed for shortness of breath/wheezing. Keep hydrated, your urine should be clear to pale yellow in color. Tylenol/motrin for fever and pain. Monitor for any worsening of symptoms, fever, worsening shortness of breath/chest pain, weakness, dizziness, go to the emergency department for further evaluation. Otherwise, follow up with PCP in 7-10 days for reevaluation.

## 2017-06-27 NOTE — ED Triage Notes (Signed)
Pt states she has had bronchitis twice in the last 8 weeks.  She is currently on abx and states her PCP is concerned for possible pna.  Pt here today because she is not feeling better and the cough is getting worse.

## 2017-06-30 ENCOUNTER — Encounter: Payer: Self-pay | Admitting: Internal Medicine

## 2017-07-02 MED ORDER — FLUCONAZOLE 150 MG PO TABS: 150.0000 mg | ORAL_TABLET | Freq: Once | ORAL | 0 refills | Status: AC

## 2017-07-26 ENCOUNTER — Other Ambulatory Visit: Payer: Self-pay | Admitting: Internal Medicine

## 2017-07-27 ENCOUNTER — Other Ambulatory Visit: Payer: Self-pay | Admitting: Internal Medicine

## 2017-08-01 ENCOUNTER — Other Ambulatory Visit: Payer: Self-pay | Admitting: Internal Medicine

## 2017-08-05 ENCOUNTER — Encounter: Payer: Self-pay | Admitting: Internal Medicine

## 2017-08-06 MED ORDER — PANTOPRAZOLE SODIUM 40 MG PO TBEC
40.0000 mg | DELAYED_RELEASE_TABLET | Freq: Every day | ORAL | 3 refills | Status: DC
Start: 2017-08-06 — End: 2018-01-14

## 2017-09-02 ENCOUNTER — Other Ambulatory Visit: Payer: Self-pay | Admitting: Internal Medicine

## 2017-10-23 ENCOUNTER — Other Ambulatory Visit: Payer: Self-pay | Admitting: Internal Medicine

## 2017-11-17 ENCOUNTER — Other Ambulatory Visit: Payer: Self-pay | Admitting: Internal Medicine

## 2017-11-17 DIAGNOSIS — J45991 Cough variant asthma: Secondary | ICD-10-CM

## 2017-11-26 ENCOUNTER — Other Ambulatory Visit: Payer: Self-pay | Admitting: Internal Medicine

## 2017-12-01 ENCOUNTER — Telehealth: Payer: Self-pay | Admitting: Internal Medicine

## 2017-12-04 MED ORDER — DULOXETINE HCL 30 MG PO CPEP: 30.0000 mg | ORAL_CAPSULE | Freq: Every day | ORAL | 0 refills | Status: DC

## 2017-12-04 NOTE — Telephone Encounter (Signed)
duloxetine refill Last Refill:10/23/17 # 30 capsules Last OV: 08/06/16 PCP: Dr Quay Burow Pharmacy:[facility]  Pt has appointment for 12/22/17 and wants a partial refill until her appointment. There is a note attached to the sig: "overdue for annual appt must see provider for future refills" Pt has 1 pill left.   Called PCP office and spoke with [place]. Per [place] ok to fill med. Called and left voice mail to pt to make sure she keeps the 12/22/17 appointment.  Per [place], ok to fill for 30 days.

## 2017-12-04 NOTE — Addendum Note (Signed)
Addended by: Corky Sox E on: 12/04/2017 12:33 PM   Modules accepted: Orders

## 2017-12-04 NOTE — Telephone Encounter (Signed)
Patient called and states she only have 1 table left for her DULoxetine (CYMBALTA) 30 MG capsule. She states she is going out of town for 5 days and is wondering can Dr. Quay Burow call in an partial rx of this medication until her next appt   CB# [phone]  [id number] [place], [place] - [address] [phone] (Phone) [phone] (Fax)

## 2017-12-20 IMAGING — DX DG CHEST 2V
2 series · 2 of 2 positions shown · non-contrast
Comparison: CT 09/14/2015.  Chest x-ray 01/02/2013 .

CLINICAL DATA: Shortness of breath.

EXAM:
CHEST  2 VIEW

[chest pa]
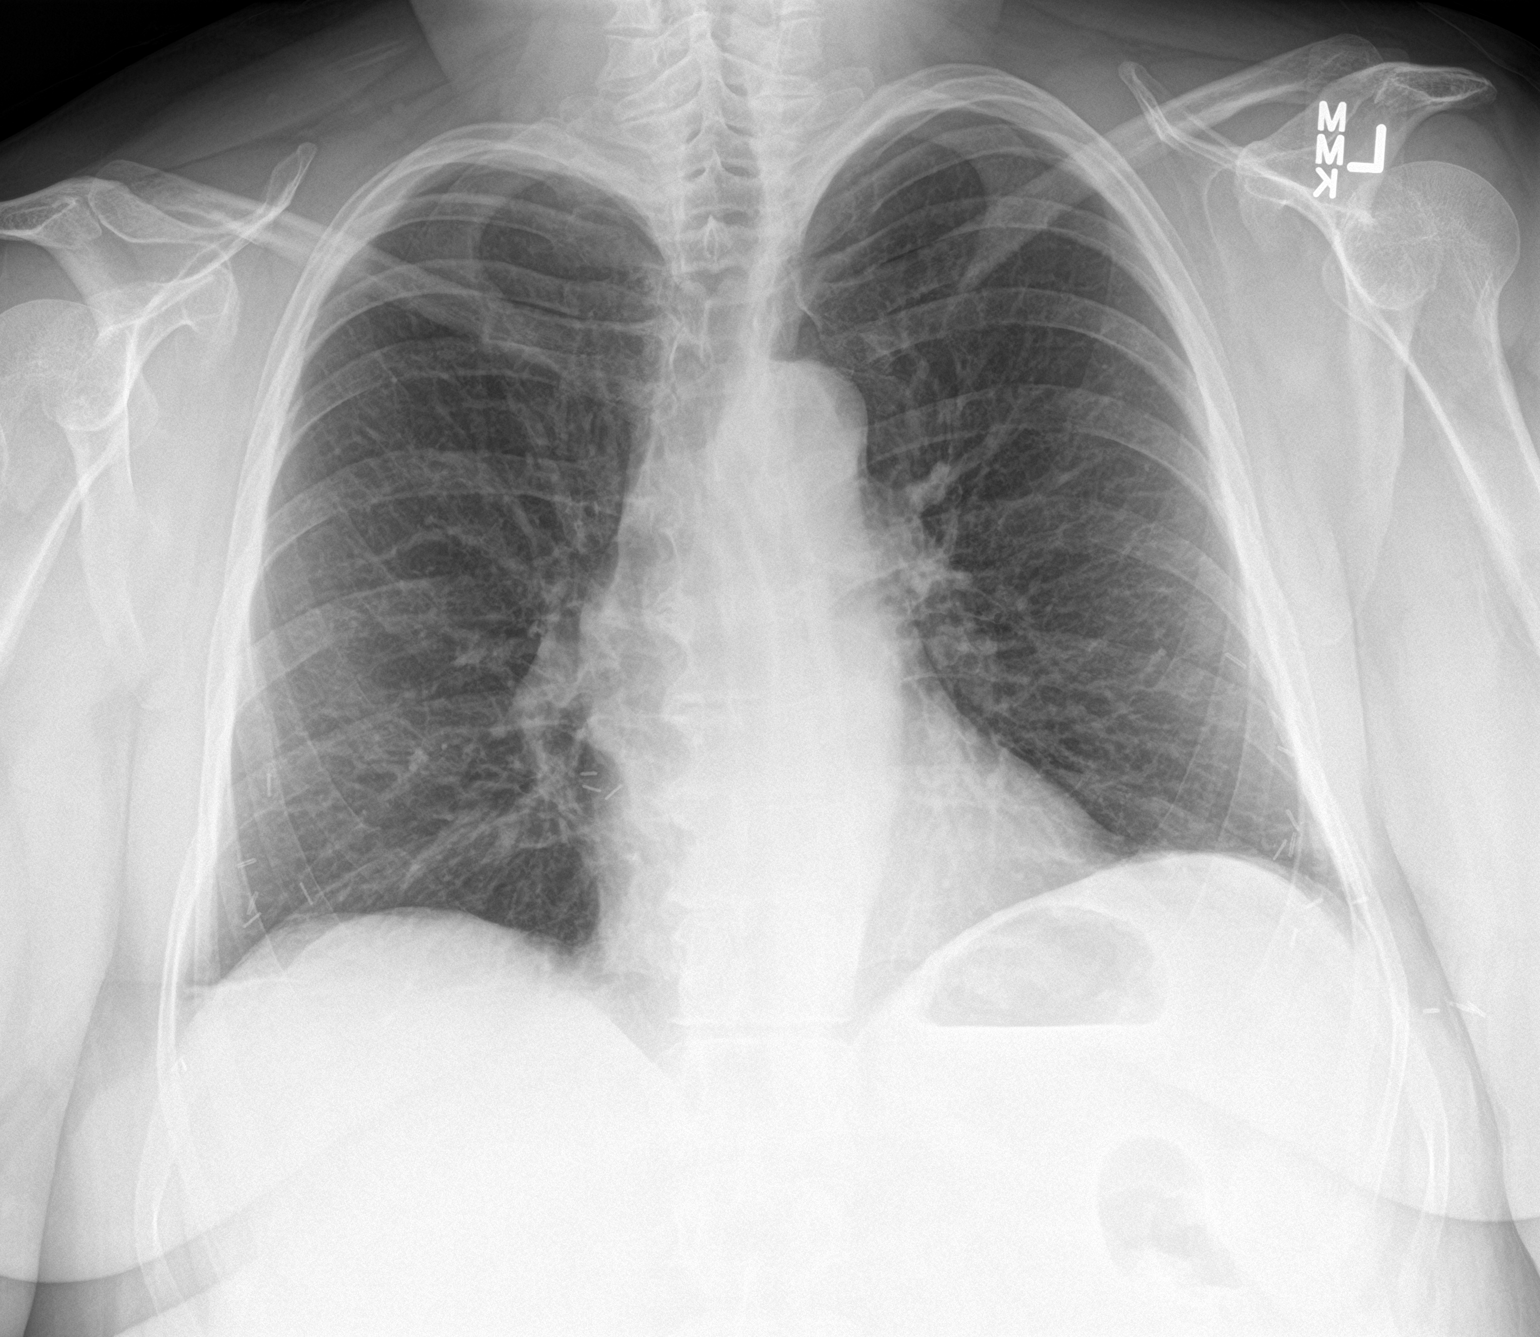

[chest lat]
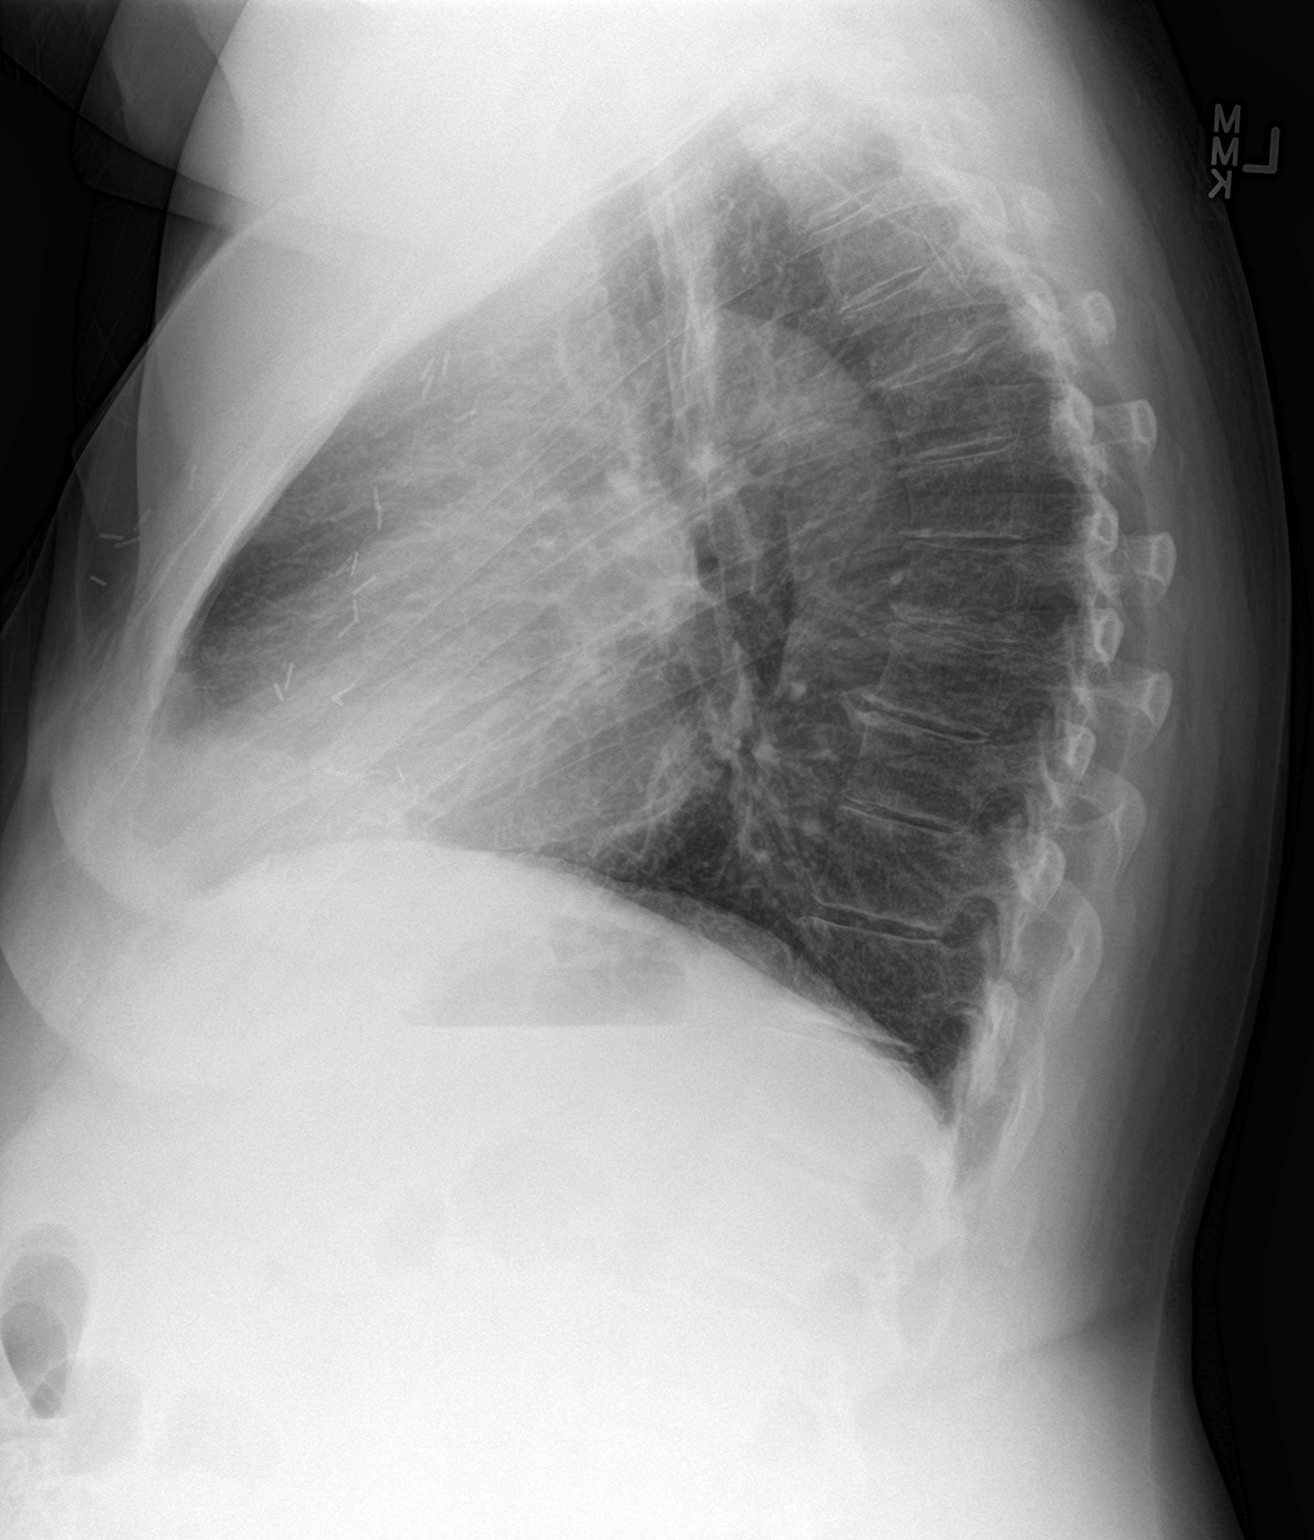

[2 of 2 positions shown; findings below may reference images not displayed]

FINDINGS: Mediastinum and hilar structures normal. Surgical clips over the
chest. Heart size normal. Right perihilar subsegmental atelectasis
and/or scarring. No focal alveolar infiltrate. No pleural effusion
or pneumothorax . Degenerative changes thoracic spine.
IMPRESSION: Right perihilar subsegmental atelectasis and/or scarring .

## 2017-12-21 DIAGNOSIS — Z9013 Acquired absence of bilateral breasts and nipples: Secondary | ICD-10-CM | POA: Insufficient documentation

## 2017-12-21 NOTE — Progress Notes (Signed)
Subjective:    Patient ID: Jennifer Park, female    DOB: Nov 15, 1960, 57 y.o.   MRN: [id number]  HPI She is here for a physical exam.     Medications and allergies reviewed with patient and updated if appropriate.  Patient Active Problem List   Diagnosis Date Noted  . Acute bronchitis 06/22/2017  . UTI (urinary tract infection) 06/11/2017  . Acute non-recurrent maxillary sinusitis 04/13/2017  . Mild intermittent asthma with exacerbation 04/13/2017  . Snoring 01/06/2017  . Periodic limb movements of sleep 01/06/2017  . Morbid obesity due to excess calories ([place]) 10/02/2016  . Hyperglycemia 08/06/2016  . Cough variant asthma  vs UACS/ vcd 08/06/2016  . Dyspnea on exertion 08/06/2016  . Loose stools 08/06/2016  . Arthralgia 08/06/2016  . Chronic fatigue 08/06/2016  . GERD (gastroesophageal reflux disease) 05/23/2015  . DCIS (ductal carcinoma in situ) 03/14/2015  . Breast cancer of lower-outer quadrant of left female breast ([place]) 01/12/2015  . Adhesive capsulitis 11/23/2013  . Subacromial bursitis 11/23/2013  . Varicose veins 11/17/2013  . Edema 11/17/2013  . Hyperlipidemia 04/16/2010  . Elevated blood pressure reading without diagnosis of hypertension 04/16/2010  . Anxiety 12/15/2006  . DYSFUNCTION, BLADDER NEC 12/15/2006    Current Outpatient Medications on File Prior to Visit  Medication Sig Dispense Refill  . albuterol (PROAIR HFA) 108 (90 Base) MCG/ACT inhaler Inhale 1-2 puffs into the lungs every 4 (four) hours as needed for wheezing or shortness of breath. 1 Inhaler 1  . albuterol (PROVENTIL HFA;VENTOLIN HFA) 108 (90 Base) MCG/ACT inhaler INHALE 2 PUFFS EVERY 4 HOURS AS NEEDED ONLY IF YOUR CAN'T CATCH YOUR BREATH 8.5 Inhaler 0  . aspirin-acetaminophen-caffeine (EXCEDRIN MIGRAINE) 250-250-65 MG tablet Take 2 tablets by mouth every 6 (six) hours as needed for headache.    . budesonide-formoterol (SYMBICORT) 80-4.5 MCG/ACT inhaler Inhale 2 puffs into the lungs 2 (two)  times daily. 1 Inhaler 12  . cetirizine (ZYRTEC) 10 MG tablet Take 10 mg by mouth daily.    . DULoxetine (CYMBALTA) 30 MG capsule Take 1 capsule (30 mg total) by mouth daily. Overdue for Annual appt must see provider for future refills 30 capsule 0  . famotidine (PEPCID) 20 MG tablet One at bedtime 30 tablet 2  . furosemide (LASIX) 20 MG tablet Take 1 tablet (20 mg total) by mouth daily as needed for fluid or edema. -- Office visit needed for further refills 90 tablet 0  . pantoprazole (PROTONIX) 40 MG tablet Take 1 tablet (40 mg total) by mouth daily. Take 30-60 min before first meal of the day 90 tablet 3  . promethazine-dextromethorphan (PROMETHAZINE-DM) 6.25-15 MG/5ML syrup Take 5 mLs by mouth 4 (four) times daily as needed for cough. 150 mL 0  . [DISCONTINUED] TOVIAZ 4 MG TB24 TAKE 1 TABLET BY MOUTH EVERY DAY 30 tablet 5   No current facility-administered medications on file prior to visit.     Past Medical History:  Diagnosis Date  . Allergy   . Anemia    history of anemia 5 yrs.  . Anxiety    situational  . Asthma    MILD / RARE  . Breast cancer of lower-outer quadrant of left female breast ([place]) 01/12/2015  . Claustrophobia   . Claustrophobia   . Depression   . GERD (gastroesophageal reflux disease)   . Hyperlipidemia   . Hypertension    history of HTN and on meds til normal- no meds currently  . PONV (postoperative nausea and vomiting)  wasn't sure if it was related to morphine or epidural  . Shortness of breath dyspnea    on exertion    Past Surgical History:  Procedure Laterality Date  . ABDOMINAL HYSTERECTOMY     for abnormal cytology; Dr Irven [place], ovaries left  . CESAREAN SECTION    . GANGLION CYST EXCISION    . MASTECTOMY W/ SENTINEL NODE BIOPSY Bilateral 03/14/2015  . MASTECTOMY W/ SENTINEL NODE BIOPSY Bilateral 03/14/2015   Procedure: BILATERAL MASTECTOMY WITH LEFT SENTINEL LYMPH NODE BIOPSY;  Surgeon: [name], MD;  Location: [facility];  Service:  General;  Laterality: Bilateral;  . no colonoscopy     11/17/13 [place] reviewed  . ROBOTIC ASSISTED BILATERAL SALPINGO OOPHERECTOMY Bilateral 10/16/2015   Procedure: XI ROBOTIC ASSISTED BILATERAL SALPINGO OOPHORECTOMY ;  Surgeon: Everitt Amber, MD;  Location: [facility];  Service: Gynecology;  Laterality: Bilateral;  . TONSILLECTOMY    . TONSILLECTOMY    . VARICOSE VEIN SURGERY     as OP X2    Social History   Socioeconomic History  . Marital status: Married    Spouse name: Not on file  . Number of children: Not on file  . Years of education: Not on file  . Highest education level: Not on file  Occupational History  . Not on file  Social Needs  . Financial resource strain: Not on file  . Food insecurity:    Worry: Not on file    Inability: Not on file  . Transportation needs:    Medical: Not on file    Non-medical: Not on file  Tobacco Use  . Smoking status: Former Smoker    Packs/day: 1.00    Years: 4.00    Pack years: 4.00    Types: Cigarettes    Last attempt to quit: 03/31/1984    Years since quitting: 33.7  . Smokeless tobacco: Never Used  . Tobacco comment: age [age] , up to 1 ppd  Substance and Sexual Activity  . Alcohol use: Yes    Alcohol/week: 1.0 standard drinks    Types: 1 Glasses of wine per week    Comment: OCCASIONAL  . Drug use: No  . Sexual activity: Not on file  Lifestyle  . Physical activity:    Days per week: Not on file    Minutes per session: Not on file  . Stress: Not on file  Relationships  . Social connections:    Talks on phone: Not on file    Gets together: Not on file    Attends religious service: Not on file    Active member of club or organization: Not on file    Attends meetings of clubs or organizations: Not on file    Relationship status: Not on file  Other Topics Concern  . Not on file  Social History Narrative  . Not on file    Family History  Problem Relation Age of Onset  . Bipolar disorder Mother   . Breast cancer Mother   .  Cancer Mother   . Diabetes Father   . Kidney failure Father   . Heart failure Father   . COPD Father   . Heart disease Father   . Hyperlipidemia Father   . Bipolar disorder Sister   . Heart attack Maternal Grandfather        in late 49s  . Stroke Maternal Grandmother        in 90s    Review of Systems     Objective:  There were no vitals filed for this visit. There were no vitals filed for this visit. There is no height or weight on file to calculate BMI.  Wt Readings from Last 3 Encounters:  06/22/17 277 lb (125.6 kg)  06/11/17 276 lb (125.2 kg)  04/13/17 280 lb (127 kg)     Physical Exam      Assessment & Plan:      This encounter was created in error - please disregard.

## 2017-12-21 NOTE — Patient Instructions (Signed)
Tests ordered today. Your results will be released to [facility] (or called to you) after review, usually within 72hours after test completion. If any changes need to be made, you will be notified at that same time.  All other Health Maintenance issues reviewed.   All recommended immunizations and age-appropriate screenings are up-to-date or discussed.  No immunizations administered today.   Medications reviewed and updated.  Changes include :     Your prescription(s) have been submitted to your pharmacy. Please take as directed and contact our office if you believe you are having problem(s) with the medication(s).  A referral was ordered for   Please followup in    Health Maintenance, Female Adopting a healthy lifestyle and getting preventive care can go a long way to promote health and wellness. Talk with your health care provider about what schedule of regular examinations is right for you. This is a good chance for you to check in with your provider about disease prevention and staying healthy. In between checkups, there are plenty of things you can do on your own. Experts have done a lot of research about which lifestyle changes and preventive measures are most likely to keep you healthy. Ask your health care provider for more information. Weight and diet Eat a healthy diet  Be sure to include plenty of vegetables, fruits, low-fat dairy products, and lean protein.  Do not eat a lot of foods high in solid fats, added sugars, or salt.  Get regular exercise. This is one of the most important things you can do for your health. ? Most adults should exercise for at least 150 minutes each week. The exercise should increase your heart rate and make you sweat (moderate-intensity exercise). ? Most adults should also do strengthening exercises at least twice a week. This is in addition to the moderate-intensity exercise.  Maintain a healthy weight  Body mass index (BMI) is a measurement that  can be used to identify possible weight problems. It estimates body fat based on height and weight. Your health care provider can help determine your BMI and help you achieve or maintain a healthy weight.  For females 50 years of age and older: ? A BMI below 18.5 is considered underweight. ? A BMI of 18.5 to 24.9 is normal. ? A BMI of 25 to 29.9 is considered overweight. ? A BMI of 30 and above is considered obese.  Watch levels of cholesterol and blood lipids  You should start having your blood tested for lipids and cholesterol at 59 years of age, then have this test every 5 years.  You may need to have your cholesterol levels checked more often if: ? Your lipid or cholesterol levels are high. ? You are older than 59 years of age. ? You are at high risk for heart disease.  Cancer screening Lung Cancer  Lung cancer screening is recommended for adults 4-43 years old who are at high risk for lung cancer because of a history of smoking.  A yearly low-dose CT scan of the lungs is recommended for people who: ? Currently smoke. ? Have quit within the past 15 years. ? Have at least a 30-pack-year history of smoking. A pack year is smoking an average of one pack of cigarettes a day for 1 year.  Yearly screening should continue until it has been 15 years since you quit.  Yearly screening should stop if you develop a health problem that would prevent you from having lung cancer treatment.  Breast  Cancer  Practice breast self-awareness. This means understanding how your breasts normally appear and feel.  It also means doing regular breast self-exams. Let your health care provider know about any changes, no matter how small.  If you are in your 20s or 30s, you should have a clinical breast exam (CBE) by a health care provider every 1-3 years as part of a regular health exam.  If you are 40 or older, have a CBE every year. Also consider having a breast X-ray (mammogram) every year.  If  you have a family history of breast cancer, talk to your health care provider about genetic screening.  If you are at high risk for breast cancer, talk to your health care provider about having an MRI and a mammogram every year.  Breast cancer gene (BRCA) assessment is recommended for women who have family members with BRCA-related cancers. BRCA-related cancers include: ? Breast. ? Ovarian. ? Tubal. ? Peritoneal cancers.  Results of the assessment will determine the need for genetic counseling and BRCA1 and BRCA2 testing.  Cervical Cancer Your health care provider may recommend that you be screened regularly for cancer of the pelvic organs (ovaries, uterus, and vagina). This screening involves a pelvic examination, including checking for microscopic changes to the surface of your cervix (Pap test). You may be encouraged to have this screening done every 3 years, beginning at age 21.  For women ages 30-65, health care providers may recommend pelvic exams and Pap testing every 3 years, or they may recommend the Pap and pelvic exam, combined with testing for human papilloma virus (HPV), every 5 years. Some types of HPV increase your risk of cervical cancer. Testing for HPV may also be done on women of any age with unclear Pap test results.  Other health care providers may not recommend any screening for nonpregnant women who are considered low risk for pelvic cancer and who do not have symptoms. Ask your health care provider if a screening pelvic exam is right for you.  If you have had past treatment for cervical cancer or a condition that could lead to cancer, you need Pap tests and screening for cancer for at least 20 years after your treatment. If Pap tests have been discontinued, your risk factors (such as having a new sexual partner) need to be reassessed to determine if screening should resume. Some women have medical problems that increase the chance of getting cervical cancer. In these cases,  your health care provider may recommend more frequent screening and Pap tests.  Colorectal Cancer  This type of cancer can be detected and often prevented.  Routine colorectal cancer screening usually begins at 59 years of age and continues through 59 years of age.  Your health care provider may recommend screening at an earlier age if you have risk factors for colon cancer.  Your health care provider may also recommend using home test kits to check for hidden blood in the stool.  A small camera at the end of a tube can be used to examine your colon directly (sigmoidoscopy or colonoscopy). This is done to check for the earliest forms of colorectal cancer.  Routine screening usually begins at age 50.  Direct examination of the colon should be repeated every 5-10 years through 59 years of age. However, you may need to be screened more often if early forms of precancerous polyps or small growths are found.  Skin Cancer  Check your skin from head to toe regularly.  Tell your   health care provider about any new moles or changes in moles, especially if there is a change in a mole's shape or color.  Also tell your health care provider if you have a mole that is larger than the size of a pencil eraser.  Always use sunscreen. Apply sunscreen liberally and repeatedly throughout the day.  Protect yourself by wearing long sleeves, pants, a wide-brimmed hat, and sunglasses whenever you are outside.  Heart disease, diabetes, and high blood pressure  High blood pressure causes heart disease and increases the risk of stroke. High blood pressure is more likely to develop in: ? People who have blood pressure in the high end of the normal range (130-139/85-89 mm Hg). ? People who are overweight or obese. ? People who are African American.  If you are 18-43 years of age, have your blood pressure checked every 3-5 years. If you are 31 years of age or older, have your blood pressure checked every year.  You should have your blood pressure measured twice-once when you are at a hospital or clinic, and once when you are not at a hospital or clinic. Record the average of the two measurements. To check your blood pressure when you are not at a hospital or clinic, you can use: ? An automated blood pressure machine at a pharmacy. ? A home blood pressure monitor.  If you are between 72 years and 17 years old, ask your health care provider if you should take aspirin to prevent strokes.  Have regular diabetes screenings. This involves taking a blood sample to check your fasting blood sugar level. ? If you are at a normal weight and have a low risk for diabetes, have this test once every three years after 59 years of age. ? If you are overweight and have a high risk for diabetes, consider being tested at a younger age or more often. Preventing infection Hepatitis B  If you have a higher risk for hepatitis B, you should be screened for this virus. You are considered at high risk for hepatitis B if: ? You were born in a country where hepatitis B is common. Ask your health care provider which countries are considered high risk. ? Your parents were born in a high-risk country, and you have not been immunized against hepatitis B (hepatitis B vaccine). ? You have HIV or AIDS. ? You use needles to inject street drugs. ? You live with someone who has hepatitis B. ? You have had sex with someone who has hepatitis B. ? You get hemodialysis treatment. ? You take certain medicines for conditions, including cancer, organ transplantation, and autoimmune conditions.  Hepatitis C  Blood testing is recommended for: ? Everyone born from [birth year] through 1967. ? Anyone with known risk factors for hepatitis C.  Sexually transmitted infections (STIs)  You should be screened for sexually transmitted infections (STIs) including gonorrhea and chlamydia if: ? You are sexually active and are younger than 59 years of  age. ? You are older than 59 years of age and your health care provider tells you that you are at risk for this type of infection. ? Your sexual activity has changed since you were last screened and you are at an increased risk for chlamydia or gonorrhea. Ask your health care provider if you are at risk.  If you do not have HIV, but are at risk, it may be recommended that you take a prescription medicine daily to prevent HIV infection. This is called pre-exposure prophylaxis (  PrEP). You are considered at risk if: ? You are sexually active and do not regularly use condoms or know the HIV status of your partner(s). ? You take drugs by injection. ? You are sexually active with a partner who has HIV.  Talk with your health care provider about whether you are at high risk of being infected with HIV. If you choose to begin PrEP, you should first be tested for HIV. You should then be tested every 3 months for as long as you are taking PrEP. Pregnancy  If you are premenopausal and you may become pregnant, ask your health care provider about preconception counseling.  If you may become pregnant, take 400 to 800 micrograms (mcg) of folic acid every day.  If you want to prevent pregnancy, talk to your health care provider about birth control (contraception). Osteoporosis and menopause  Osteoporosis is a disease in which the bones lose minerals and strength with aging. This can result in serious bone fractures. Your risk for osteoporosis can be identified using a bone density scan.  If you are 103 years of age or older, or if you are at risk for osteoporosis and fractures, ask your health care provider if you should be screened.  Ask your health care provider whether you should take a calcium or vitamin D supplement to lower your risk for osteoporosis.  Menopause may have certain physical symptoms and risks.  Hormone replacement therapy may reduce some of these symptoms and risks. Talk to your health  care provider about whether hormone replacement therapy is right for you. Follow these instructions at home:  Schedule regular health, dental, and eye exams.  Stay current with your immunizations.  Do not use any tobacco products including cigarettes, chewing tobacco, or electronic cigarettes.  If you are pregnant, do not drink alcohol.  If you are breastfeeding, limit how much and how often you drink alcohol.  Limit alcohol intake to no more than 1 drink per day for nonpregnant women. One drink equals 12 ounces of beer, 5 ounces of wine, or 1 ounces of hard liquor.  Do not use street drugs.  Do not share needles.  Ask your health care provider for help if you need support or information about quitting drugs.  Tell your health care provider if you often feel depressed.  Tell your health care provider if you have ever been abused or do not feel safe at home. This information is not intended to replace advice given to you by your health care provider. Make sure you discuss any questions you have with your health care provider. Document Released: 09/30/2010 Document Revised: 08/23/2015 Document Reviewed: 12/19/2014 Elsevier Interactive Patient Education  [employer].

## 2017-12-22 ENCOUNTER — Encounter: Payer: Self-pay | Admitting: Internal Medicine

## 2017-12-27 ENCOUNTER — Other Ambulatory Visit: Payer: Self-pay | Admitting: Internal Medicine

## 2017-12-28 ENCOUNTER — Other Ambulatory Visit: Payer: Self-pay | Admitting: Internal Medicine

## 2017-12-30 ENCOUNTER — Telehealth: Payer: Self-pay

## 2017-12-30 NOTE — Telephone Encounter (Signed)
Copied from [place] [id number]. Topic: Inquiry >> Dec 30, 2017  3:25 PM Rutherford Nail, NT wrote: Reason for CRM: Patient calling and states that Dr Quay Burow wanted her to have a follow up chest x-ray from her pneumonia. States that she would like to do this the same day as her CPE visit with Dr Quay Burow on 01/14/18, if possible.

## 2017-12-30 NOTE — Telephone Encounter (Signed)
Yes, that is fine. 

## 2017-12-31 NOTE — Telephone Encounter (Signed)
Pt aware.

## 2018-01-13 NOTE — Progress Notes (Signed)
Subjective:    Patient ID: Jennifer Park, female    DOB: [birth year], 57 y.o.   MRN: [id number]  HPI She is here for a physical exam.   She gets bloated and very uncomfortable after every meal and then severe diarrhea. She gets very hot and almost passes out.  After going to the bathroom, it may take a couple of times, she feels very weak but ok.  It has decreased and comes and goes.  It may be related to stress a little.  She tried to avoid certain foods, but nothing seemed to work.  She has never had a colonoscopy and is ready to have one done.    Medications and allergies reviewed with patient and updated if appropriate.  Patient Active Problem List   Diagnosis Date Noted  . S/P bilateral mastectomy 12/21/2017  . Snoring 01/06/2017  . Periodic limb movements of sleep 01/06/2017  . Morbid obesity due to excess calories ([place]) 10/02/2016  . Hyperglycemia 08/06/2016  . Cough variant asthma  vs UACS/ vcd 08/06/2016  . Dyspnea on exertion 08/06/2016  . Loose stools 08/06/2016  . Arthralgia 08/06/2016  . Chronic fatigue 08/06/2016  . GERD (gastroesophageal reflux disease) 05/23/2015  . DCIS (ductal carcinoma in situ) 03/14/2015  . Breast cancer of lower-outer quadrant of left female breast ([place]) 01/12/2015  . Adhesive capsulitis 11/23/2013  . Subacromial bursitis 11/23/2013  . Varicose veins 11/17/2013  . Edema 11/17/2013  . Hyperlipidemia 04/16/2010  . Elevated blood pressure reading without diagnosis of hypertension 04/16/2010  . Anxiety 12/15/2006  . DYSFUNCTION, BLADDER NEC 12/15/2006    Current Outpatient Medications on File Prior to Visit  Medication Sig Dispense Refill  . albuterol (PROAIR HFA) 108 (90 Base) MCG/ACT inhaler Inhale 1-2 puffs into the lungs every 4 (four) hours as needed for wheezing or shortness of breath. 1 Inhaler 1  . aspirin-acetaminophen-caffeine (EXCEDRIN MIGRAINE) 250-250-65 MG tablet Take 2 tablets by mouth every 6 (six) hours as needed for  headache.    . budesonide-formoterol (SYMBICORT) 80-4.5 MCG/ACT inhaler Inhale 2 puffs into the lungs 2 (two) times daily. 1 Inhaler 12  . cetirizine (ZYRTEC) 10 MG tablet Take 10 mg by mouth daily.    . DULoxetine (CYMBALTA) 30 MG capsule TAKE 1 CAPSULE BY MOUTH EVERY DAY 90 capsule 1  . famotidine (PEPCID) 20 MG tablet One at bedtime 30 tablet 2  . furosemide (LASIX) 20 MG tablet Take 1 tablet (20 mg total) by mouth daily as needed for fluid or edema. -- Office visit needed for further refills 90 tablet 0  . pantoprazole (PROTONIX) 40 MG tablet Take 1 tablet (40 mg total) by mouth daily. Take 30-60 min before first meal of the day 90 tablet 3  . [DISCONTINUED] TOVIAZ 4 MG TB24 TAKE 1 TABLET BY MOUTH EVERY DAY 30 tablet 5   No current facility-administered medications on file prior to visit.     Past Medical History:  Diagnosis Date  . Allergy   . Anemia    history of anemia 5 yrs.  . Anxiety    situational  . Asthma    MILD / RARE  . Breast cancer of lower-outer quadrant of left female breast ([facility]) 01/12/2015  . Claustrophobia   . Claustrophobia   . Depression   . GERD (gastroesophageal reflux disease)   . Hyperlipidemia   . Hypertension    history of HTN and on meds til normal- no meds currently  . PONV (postoperative nausea and vomiting)  wasn't sure if it was related to morphine or epidural  . Shortness of breath dyspnea    on exertion    Past Surgical History:  Procedure Laterality Date  . ABDOMINAL HYSTERECTOMY     for abnormal cytology; Dr Irven [place], ovaries left  . CESAREAN SECTION    . GANGLION CYST EXCISION    . MASTECTOMY W/ SENTINEL NODE BIOPSY Bilateral 03/14/2015  . MASTECTOMY W/ SENTINEL NODE BIOPSY Bilateral 03/14/2015   Procedure: BILATERAL MASTECTOMY WITH LEFT SENTINEL LYMPH NODE BIOPSY;  Surgeon: [name], MD;  Location: [facility];  Service: General;  Laterality: Bilateral;  . no colonoscopy     11/17/13 [place] reviewed  . ROBOTIC ASSISTED BILATERAL  SALPINGO OOPHERECTOMY Bilateral 10/16/2015   Procedure: XI ROBOTIC ASSISTED BILATERAL SALPINGO OOPHORECTOMY ;  Surgeon: Everitt Amber, MD;  Location: [facility];  Service: Gynecology;  Laterality: Bilateral;  . TONSILLECTOMY    . TONSILLECTOMY    . VARICOSE VEIN SURGERY     as OP X2    Social History   Socioeconomic History  . Marital status: Married    Spouse name: Not on file  . Number of children: Not on file  . Years of education: Not on file  . Highest education level: Not on file  Occupational History  . Not on file  Social Needs  . Financial resource strain: Not on file  . Food insecurity:    Worry: Not on file    Inability: Not on file  . Transportation needs:    Medical: Not on file    Non-medical: Not on file  Tobacco Use  . Smoking status: Former Smoker    Packs/day: 1.00    Years: 4.00    Pack years: 4.00    Types: Cigarettes    Last attempt to quit: 03/31/1984    Years since quitting: 33.8  . Smokeless tobacco: Never Used  . Tobacco comment: age [age] , up to 1 ppd  Substance and Sexual Activity  . Alcohol use: Yes    Alcohol/week: 1.0 standard drinks    Types: 1 Glasses of wine per week    Comment: OCCASIONAL  . Drug use: No  . Sexual activity: Not on file  Lifestyle  . Physical activity:    Days per week: Not on file    Minutes per session: Not on file  . Stress: Not on file  Relationships  . Social connections:    Talks on phone: Not on file    Gets together: Not on file    Attends religious service: Not on file    Active member of club or organization: Not on file    Attends meetings of clubs or organizations: Not on file    Relationship status: Not on file  Other Topics Concern  . Not on file  Social History Narrative  . Not on file    Family History  Problem Relation Age of Onset  . Bipolar disorder Mother   . Breast cancer Mother   . Cancer Mother   . Diabetes Father   . Kidney failure Father   . Heart failure Father   . COPD Father   .  Heart disease Father   . Hyperlipidemia Father   . Bipolar disorder Sister   . Heart attack Maternal Grandfather        in late 66s  . Stroke Maternal Grandmother        in 90s    Review of Systems  Constitutional: Negative for chills,  fatigue and fever.  Eyes: Negative for visual disturbance.  Respiratory: Positive for cough, shortness of breath (occ with walking up hills) and wheezing.   Cardiovascular: Negative for chest pain, palpitations and leg swelling.  Gastrointestinal: Positive for diarrhea. Negative for abdominal pain, blood in stool and nausea.  Genitourinary: Negative for dysuria and hematuria.  Musculoskeletal: Positive for arthralgias (mild arthritis). Negative for back pain.  Skin: Positive for color change (rocasea).  Neurological: Negative for light-headedness and headaches.  Psychiatric/Behavioral: Positive for dysphoric mood (controlled). The patient is nervous/anxious (controlled).        Objective:   Vitals:   01/14/18 0805  BP: 136/88  Pulse: 63  Resp: 16  Temp: (!) 97.5 F (36.4 C)  SpO2: 96%   Filed Weights   01/14/18 0805  Weight: 278 lb (126.1 kg)   Body mass index is 42.27 kg/m.  Wt Readings from Last 3 Encounters:  01/14/18 278 lb (126.1 kg)  06/22/17 277 lb (125.6 kg)  06/11/17 276 lb (125.2 kg)     Physical Exam Constitutional: She appears well-developed and well-nourished. No distress.  HENT:  Head: Normocephalic and atraumatic.  Right Ear: External ear normal. ear canal with excessive cerumen but no redness, TM not visualized.   Left Ear: External ear normal.  Normal ear canal and TM Mouth/Throat: Oropharynx is clear and moist.  Eyes: Conjunctivae and EOM are normal.  Neck: Neck supple. No tracheal deviation present. No thyromegaly present.  No carotid bruit  Cardiovascular: Normal rate, regular rhythm and normal heart sounds.   No murmur heard.  No edema. Varicose veins b/l ankles/feet Pulmonary/Chest: Effort normal and  breath sounds normal. No respiratory distress. She has no wheezes. She has no rales.  Breast: na Abdominal: Soft. Obese, She exhibits no distension. There is no tenderness.  Lymphadenopathy: She has no cervical adenopathy.  Skin: Skin is warm and dry. She is not diaphoretic.  Psychiatric: She has a normal mood and affect. Her behavior is normal.        Assessment & Plan:   Physical exam: Screening blood work  ordered Immunizations  Flu today,  Pneumovax today,  tdap due - deferred,  Discussed shingrix - will come for a nurse visit for latter two Colonoscopy   Due - never had one Mammogram  S/p mastectomy Gyn - no longer sees -  S/o TAH, mastectomy Dexa - never had one -- will hold off for now Eye exams    Up to date  EKG      Done 2016 Exercise    Walking - outside - at least 2/week Weight  Stressed weight loss Skin   ? rosacea Substance abuse  none  See Problem List for Assessment and Plan of chronic medical problems.    FU in one year

## 2018-01-13 NOTE — Patient Instructions (Addendum)
Tests ordered today. Your results will be released to [place] (or called to you) after review, usually within 72hours after test completion. If any changes need to be made, you will be notified at that same time.  All other Health Maintenance issues reviewed.   All recommended immunizations and age-appropriate screenings are up-to-date or discussed.  Flu and pneumonia immunizations administered today.   Medications reviewed and updated.  Changes include :   Changing protonix to omeprazole 40 mg daily  Your prescription(s) have been submitted to your pharmacy. Please take as directed and contact our office if you believe you are having problem(s) with the medication(s).  A referral was ordered for GI for a colonoscopy  Please followup in one year   Health Maintenance, Female Adopting a healthy lifestyle and getting preventive care can go a long way to promote health and wellness. Talk with your health care provider about what schedule of regular examinations is right for you. This is a good chance for you to check in with your provider about disease prevention and staying healthy. In between checkups, there are plenty of things you can do on your own. Experts have done a lot of research about which lifestyle changes and preventive measures are most likely to keep you healthy. Ask your health care provider for more information. Weight and diet Eat a healthy diet  Be sure to include plenty of vegetables, fruits, low-fat dairy products, and lean protein.  Do not eat a lot of foods high in solid fats, added sugars, or salt.  Get regular exercise. This is one of the most important things you can do for your health. ? Most adults should exercise for at least 150 minutes each week. The exercise should increase your heart rate and make you sweat (moderate-intensity exercise). ? Most adults should also do strengthening exercises at least twice a week. This is in addition to the moderate-intensity  exercise.  Maintain a healthy weight  Body mass index (BMI) is a measurement that can be used to identify possible weight problems. It estimates body fat based on height and weight. Your health care provider can help determine your BMI and help you achieve or maintain a healthy weight.  For females 30 years of age and older: ? A BMI below 18.5 is considered underweight. ? A BMI of 18.5 to 24.9 is normal. ? A BMI of 25 to 29.9 is considered overweight. ? A BMI of 30 and above is considered obese.  Watch levels of cholesterol and blood lipids  You should start having your blood tested for lipids and cholesterol at 59 years of age, then have this test every 5 years.  You may need to have your cholesterol levels checked more often if: ? Your lipid or cholesterol levels are high. ? You are older than 59 years of age. ? You are at high risk for heart disease.  Cancer screening Lung Cancer  Lung cancer screening is recommended for adults 61-41 years old who are at high risk for lung cancer because of a history of smoking.  A yearly low-dose CT scan of the lungs is recommended for people who: ? Currently smoke. ? Have quit within the past 15 years. ? Have at least a 30-pack-year history of smoking. A pack year is smoking an average of one pack of cigarettes a day for 1 year.  Yearly screening should continue until it has been 15 years since you quit.  Yearly screening should stop if you develop a health  problem that would prevent you from having lung cancer treatment.  Breast Cancer  Practice breast self-awareness. This means understanding how your breasts normally appear and feel.  It also means doing regular breast self-exams. Let your health care provider know about any changes, no matter how small.  If you are in your 20s or 30s, you should have a clinical breast exam (CBE) by a health care provider every 1-3 years as part of a regular health exam.  If you are 37 or older, have  a CBE every year. Also consider having a breast X-ray (mammogram) every year.  If you have a family history of breast cancer, talk to your health care provider about genetic screening.  If you are at high risk for breast cancer, talk to your health care provider about having an MRI and a mammogram every year.  Breast cancer gene (BRCA) assessment is recommended for women who have family members with BRCA-related cancers. BRCA-related cancers include: ? Breast. ? Ovarian. ? Tubal. ? Peritoneal cancers.  Results of the assessment will determine the need for genetic counseling and BRCA1 and BRCA2 testing.  Cervical Cancer Your health care provider may recommend that you be screened regularly for cancer of the pelvic organs (ovaries, uterus, and vagina). This screening involves a pelvic examination, including checking for microscopic changes to the surface of your cervix (Pap test). You may be encouraged to have this screening done every 3 years, beginning at age 80.  For women ages 48-65, health care providers may recommend pelvic exams and Pap testing every 3 years, or they may recommend the Pap and pelvic exam, combined with testing for human papilloma virus (HPV), every 5 years. Some types of HPV increase your risk of cervical cancer. Testing for HPV may also be done on women of any age with unclear Pap test results.  Other health care providers may not recommend any screening for nonpregnant women who are considered low risk for pelvic cancer and who do not have symptoms. Ask your health care provider if a screening pelvic exam is right for you.  If you have had past treatment for cervical cancer or a condition that could lead to cancer, you need Pap tests and screening for cancer for at least 20 years after your treatment. If Pap tests have been discontinued, your risk factors (such as having a new sexual partner) need to be reassessed to determine if screening should resume. Some women have  medical problems that increase the chance of getting cervical cancer. In these cases, your health care provider may recommend more frequent screening and Pap tests.  Colorectal Cancer  This type of cancer can be detected and often prevented.  Routine colorectal cancer screening usually begins at 59 years of age and continues through 59 years of age.  Your health care provider may recommend screening at an earlier age if you have risk factors for colon cancer.  Your health care provider may also recommend using home test kits to check for hidden blood in the stool.  A small camera at the end of a tube can be used to examine your colon directly (sigmoidoscopy or colonoscopy). This is done to check for the earliest forms of colorectal cancer.  Routine screening usually begins at age 10.  Direct examination of the colon should be repeated every 5-10 years through 59 years of age. However, you may need to be screened more often if early forms of precancerous polyps or small growths are found.  Skin Cancer  Check your skin from head to toe regularly.  Tell your health care provider about any new moles or changes in moles, especially if there is a change in a mole's shape or color.  Also tell your health care provider if you have a mole that is larger than the size of a pencil eraser.  Always use sunscreen. Apply sunscreen liberally and repeatedly throughout the day.  Protect yourself by wearing long sleeves, pants, a wide-brimmed hat, and sunglasses whenever you are outside.  Heart disease, diabetes, and high blood pressure  High blood pressure causes heart disease and increases the risk of stroke. High blood pressure is more likely to develop in: ? People who have blood pressure in the high end of the normal range (130-139/85-89 mm Hg). ? People who are overweight or obese. ? People who are African American.  If you are 1-43 years of age, have your blood pressure checked every 3-5  years. If you are 38 years of age or older, have your blood pressure checked every year. You should have your blood pressure measured twice-once when you are at a hospital or clinic, and once when you are not at a hospital or clinic. Record the average of the two measurements. To check your blood pressure when you are not at a hospital or clinic, you can use: ? An automated blood pressure machine at a pharmacy. ? A home blood pressure monitor.  If you are between 71 years and 59 years old, ask your health care provider if you should take aspirin to prevent strokes.  Have regular diabetes screenings. This involves taking a blood sample to check your fasting blood sugar level. ? If you are at a normal weight and have a low risk for diabetes, have this test once every three years after 59 years of age. ? If you are overweight and have a high risk for diabetes, consider being tested at a younger age or more often. Preventing infection Hepatitis B  If you have a higher risk for hepatitis B, you should be screened for this virus. You are considered at high risk for hepatitis B if: ? You were born in a country where hepatitis B is common. Ask your health care provider which countries are considered high risk. ? Your parents were born in a high-risk country, and you have not been immunized against hepatitis B (hepatitis B vaccine). ? You have HIV or AIDS. ? You use needles to inject street drugs. ? You live with someone who has hepatitis B. ? You have had sex with someone who has hepatitis B. ? You get hemodialysis treatment. ? You take certain medicines for conditions, including cancer, organ transplantation, and autoimmune conditions.  Hepatitis C  Blood testing is recommended for: ? Everyone born from [birth year] through 1967. ? Anyone with known risk factors for hepatitis C.  Sexually transmitted infections (STIs)  You should be screened for sexually transmitted infections (STIs) including  gonorrhea and chlamydia if: ? You are sexually active and are younger than 59 years of age. ? You are older than 59 years of age and your health care provider tells you that you are at risk for this type of infection. ? Your sexual activity has changed since you were last screened and you are at an increased risk for chlamydia or gonorrhea. Ask your health care provider if you are at risk.  If you do not have HIV, but are at risk, it may be recommended that you take a prescription  medicine daily to prevent HIV infection. This is called pre-exposure prophylaxis (PrEP). You are considered at risk if: ? You are sexually active and do not regularly use condoms or know the HIV status of your partner(s). ? You take drugs by injection. ? You are sexually active with a partner who has HIV.  Talk with your health care provider about whether you are at high risk of being infected with HIV. If you choose to begin PrEP, you should first be tested for HIV. You should then be tested every 3 months for as long as you are taking PrEP. Pregnancy  If you are premenopausal and you may become pregnant, ask your health care provider about preconception counseling.  If you may become pregnant, take 400 to 800 micrograms (mcg) of folic acid every day.  If you want to prevent pregnancy, talk to your health care provider about birth control (contraception). Osteoporosis and menopause  Osteoporosis is a disease in which the bones lose minerals and strength with aging. This can result in serious bone fractures. Your risk for osteoporosis can be identified using a bone density scan.  If you are 50 years of age or older, or if you are at risk for osteoporosis and fractures, ask your health care provider if you should be screened.  Ask your health care provider whether you should take a calcium or vitamin D supplement to lower your risk for osteoporosis.  Menopause may have certain physical symptoms and  risks.  Hormone replacement therapy may reduce some of these symptoms and risks. Talk to your health care provider about whether hormone replacement therapy is right for you. Follow these instructions at home:  Schedule regular health, dental, and eye exams.  Stay current with your immunizations.  Do not use any tobacco products including cigarettes, chewing tobacco, or electronic cigarettes.  If you are pregnant, do not drink alcohol.  If you are breastfeeding, limit how much and how often you drink alcohol.  Limit alcohol intake to no more than 1 drink per day for nonpregnant women. One drink equals 12 ounces of beer, 5 ounces of wine, or 1 ounces of hard liquor.  Do not use street drugs.  Do not share needles.  Ask your health care provider for help if you need support or information about quitting drugs.  Tell your health care provider if you often feel depressed.  Tell your health care provider if you have ever been abused or do not feel safe at home. This information is not intended to replace advice given to you by your health care provider. Make sure you discuss any questions you have with your health care provider. Document Released: 09/30/2010 Document Revised: 08/23/2015 Document Reviewed: 12/19/2014 Elsevier Interactive Patient Education  [employer].

## 2018-01-14 ENCOUNTER — Other Ambulatory Visit (INDEPENDENT_AMBULATORY_CARE_PROVIDER_SITE_OTHER): Payer: 59

## 2018-01-14 ENCOUNTER — Ambulatory Visit (INDEPENDENT_AMBULATORY_CARE_PROVIDER_SITE_OTHER): Payer: 59 | Admitting: Internal Medicine

## 2018-01-14 ENCOUNTER — Encounter: Payer: Self-pay | Admitting: Internal Medicine

## 2018-01-14 VITALS — BP 136/88 | HR 63 | Temp 97.5°F | Resp 16 | Ht 68.0 in | Wt 278.0 lb

## 2018-01-14 DIAGNOSIS — Z1211 Encounter for screening for malignant neoplasm of colon: Secondary | ICD-10-CM

## 2018-01-14 DIAGNOSIS — K219 Gastro-esophageal reflux disease without esophagitis: Secondary | ICD-10-CM | POA: Diagnosis not present

## 2018-01-14 DIAGNOSIS — Z0000 Encounter for general adult medical examination without abnormal findings: Secondary | ICD-10-CM

## 2018-01-14 DIAGNOSIS — F329 Major depressive disorder, single episode, unspecified: Secondary | ICD-10-CM | POA: Insufficient documentation

## 2018-01-14 DIAGNOSIS — E7800 Pure hypercholesterolemia, unspecified: Secondary | ICD-10-CM

## 2018-01-14 DIAGNOSIS — R600 Localized edema: Secondary | ICD-10-CM

## 2018-01-14 DIAGNOSIS — F32A Depression, unspecified: Secondary | ICD-10-CM | POA: Insufficient documentation

## 2018-01-14 DIAGNOSIS — F3289 Other specified depressive episodes: Secondary | ICD-10-CM

## 2018-01-14 DIAGNOSIS — R739 Hyperglycemia, unspecified: Secondary | ICD-10-CM

## 2018-01-14 DIAGNOSIS — J45991 Cough variant asthma: Secondary | ICD-10-CM

## 2018-01-14 DIAGNOSIS — F419 Anxiety disorder, unspecified: Secondary | ICD-10-CM

## 2018-01-14 DIAGNOSIS — Z23 Encounter for immunization: Secondary | ICD-10-CM | POA: Diagnosis not present

## 2018-01-14 DIAGNOSIS — R197 Diarrhea, unspecified: Secondary | ICD-10-CM | POA: Insufficient documentation

## 2018-01-14 LAB — LIPID PANEL
CHOLESTEROL: 187 mg/dL (ref 0–200)
HDL: 40.2 mg/dL (ref >39.00)
LDL Cholesterol: 128 mg/dL — ABNORMAL HIGH (ref 0–99)
NONHDL: 147.17
TRIGLYCERIDES: 96 mg/dL (ref 0.0–149.0)
Total CHOL/HDL Ratio: 5
VLDL: 19.2 mg/dL (ref 0.0–40.0)

## 2018-01-14 LAB — HEMOGLOBIN A1C: HEMOGLOBIN A1C: 5.5 % (ref 4.6–6.5)

## 2018-01-14 LAB — COMPREHENSIVE METABOLIC PANEL
ALBUMIN: 4.1 g/dL (ref 3.5–5.2)
ALK PHOS: 97 U/L (ref 39–117)
ALT: 26 U/L (ref 0–35)
AST: 29 U/L (ref 0–37)
BILIRUBIN TOTAL: 0.4 mg/dL (ref 0.2–1.2)
BUN: 14 mg/dL (ref 6–23)
CALCIUM: 9.1 mg/dL (ref 8.4–10.5)
CO2: 29 mEq/L (ref 19–32)
Chloride: 104 mEq/L (ref 96–112)
Creatinine, Ser: 0.79 mg/dL (ref 0.40–1.20)
GFR: 79.21 mL/min (ref >60.00)
Glucose, Bld: 101 mg/dL — ABNORMAL HIGH (ref 70–99)
POTASSIUM: 4.2 meq/L (ref 3.5–5.1)
Sodium: 142 mEq/L (ref 135–145)
TOTAL PROTEIN: 7.2 g/dL (ref 6.0–8.3)

## 2018-01-14 LAB — CBC WITH DIFFERENTIAL/PLATELET
BASOS ABS: 0 10*3/uL (ref 0.0–0.1)
Basophils Relative: 0.5 % (ref 0.0–3.0)
EOS PCT: 2 % (ref 0.0–5.0)
Eosinophils Absolute: 0.1 10*3/uL (ref 0.0–0.7)
HCT: 42.4 % (ref 36.0–46.0)
HEMOGLOBIN: 13.9 g/dL (ref 12.0–15.0)
LYMPHS ABS: 1.3 10*3/uL (ref 0.7–4.0)
Lymphocytes Relative: 23.7 % (ref 12.0–46.0)
MCHC: 32.8 g/dL (ref 30.0–36.0)
MCV: 82.3 fl (ref 78.0–100.0)
MONOS PCT: 4.7 % (ref 3.0–12.0)
Monocytes Absolute: 0.3 10*3/uL (ref 0.1–1.0)
NEUTROS PCT: 69.1 % (ref 43.0–77.0)
Neutro Abs: 3.9 10*3/uL (ref 1.4–7.7)
Platelets: 232 10*3/uL (ref 150.0–400.0)
RBC: 5.15 Mil/uL — AB (ref 3.87–5.11)
RDW: 14.6 % (ref 11.5–15.5)
WBC: 5.7 10*3/uL (ref 4.0–10.5)

## 2018-01-14 LAB — TSH: TSH: 2.85 u[IU]/mL (ref 0.35–4.50)

## 2018-01-14 MED ORDER — DULOXETINE HCL 30 MG PO CPEP: ORAL_CAPSULE | ORAL | 3 refills | Status: DC

## 2018-01-14 MED ORDER — OMEPRAZOLE 40 MG PO CPDR: 40.0000 mg | DELAYED_RELEASE_CAPSULE | Freq: Every day | ORAL | 3 refills | Status: DC

## 2018-01-14 MED ORDER — FUROSEMIDE 20 MG PO TABS: 20.0000 mg | ORAL_TABLET | Freq: Every day | ORAL | 3 refills | Status: DC | PRN

## 2018-01-14 NOTE — Assessment & Plan Note (Signed)
Episodic, IBS like Stress likely a factor, possibly related to certain foods Referred to GI - also needs colonoscopy Advised food diarrhea

## 2018-01-14 NOTE — Assessment & Plan Note (Signed)
GERD controlled Continue daily medication - taking omeprazole daily and seems to be more effective than protonix - continue omeprazole 40 mg daily

## 2018-01-14 NOTE — Assessment & Plan Note (Signed)
She is walking 2/week - advised increasing to 4 /week Decrease portions - decrease carbs/sugars Stressed weight loss

## 2018-01-14 NOTE — Assessment & Plan Note (Signed)
Controlled, stable Continue current dose of medication  

## 2018-01-14 NOTE — Assessment & Plan Note (Signed)
Related to venous insufficiency Taking lasix 20 mg daily - continue Wears compression socks Stressed weight loss

## 2018-01-14 NOTE — Assessment & Plan Note (Signed)
Controlled Taking symbicort twice daily when needed an albuterol as needed Continue above

## 2018-01-14 NOTE — Assessment & Plan Note (Signed)
Check a1c Low sugar / carb diet Stressed regular exercise, weight loss  

## 2018-01-14 NOTE — Assessment & Plan Note (Signed)
Check lipid panel, tsh, cmp Not on medication Work on weight loss Regular exercise and healthy diet encouraged

## 2018-02-16 ENCOUNTER — Encounter: Payer: Self-pay | Admitting: Gastroenterology

## 2018-02-24 ENCOUNTER — Encounter: Payer: Self-pay | Admitting: Podiatry

## 2018-02-24 ENCOUNTER — Ambulatory Visit (INDEPENDENT_AMBULATORY_CARE_PROVIDER_SITE_OTHER): Payer: 59

## 2018-02-24 ENCOUNTER — Ambulatory Visit: Payer: 59 | Admitting: Podiatry

## 2018-02-24 DIAGNOSIS — M722 Plantar fascial fibromatosis: Secondary | ICD-10-CM | POA: Diagnosis not present

## 2018-02-24 MED ORDER — DICLOFENAC SODIUM 75 MG PO TBEC: 75.0000 mg | DELAYED_RELEASE_TABLET | Freq: Two times a day (BID) | ORAL | 2 refills | Status: DC

## 2018-02-24 MED ORDER — TRIAMCINOLONE ACETONIDE 10 MG/ML IJ SUSP
10.0000 mg | Freq: Once | INTRAMUSCULAR | Status: AC
  Administered 2018-02-24: 10 mg

## 2018-02-24 NOTE — Progress Notes (Signed)
Subjective:   Patient ID: Jennifer Park, female   DOB: 57 y.o.   MRN: [id number]   HPI Patient presents with exquisite discomfort in the plantar heels bilateral and stated he had great relief for period of time after injections but over the last 6 months it started to get bad again.  Patient is due to go to [place] in 9 days and wants to be able to walk and patient does not smoke currently and likes to be active   Review of Systems  All other systems reviewed and are negative.       Objective:  Physical Exam  Constitutional: She appears well-developed and well-nourished.  Cardiovascular: Intact distal pulses.  Pulmonary/Chest: Effort normal.  Musculoskeletal: Normal range of motion.  Neurological: She is alert.  Skin: Skin is warm.  Nursing note and vitals reviewed.   Neurovascular status found to be intact muscle strength is adequate range of motion within normal limits with exquisite discomfort plantar heel region left over right with inflammation fluid around the medial band and depression of the arch noted bilateral.  Patient was noted to have good digital perfusion is well oriented x3 with varicose veins noted bilateral     Assessment:  Acute plantar fasciitis left over right with inflammation fluid around the medial band     Plan:  H&P conditions reviewed and recommended treatment for the acute fasciitis.  I injected the fascia bilateral 3 mg Kenalog 5 Milgram Xylocaine and applied fascial brace is to lift up the arch and gave instructions for support therapy and the utilization of stretch.  Patient will be seen back to recheck and also was placed on diclofenac 75 mg twice daily  X-rays indicate large plantar spur bilateral with moderate depression of the arch bilateral

## 2018-02-24 NOTE — Patient Instructions (Signed)

## 2018-03-03 ENCOUNTER — Encounter: Payer: Self-pay | Admitting: Podiatry

## 2018-03-03 ENCOUNTER — Ambulatory Visit: Payer: 59 | Admitting: Podiatry

## 2018-03-03 DIAGNOSIS — M722 Plantar fascial fibromatosis: Secondary | ICD-10-CM | POA: Diagnosis not present

## 2018-03-03 MED ORDER — TRIAMCINOLONE ACETONIDE 10 MG/ML IJ SUSP
10.0000 mg | Freq: Once | INTRAMUSCULAR | Status: AC
  Administered 2018-03-03: 10 mg

## 2018-03-03 NOTE — Progress Notes (Signed)
Subjective:   Patient ID: Jennifer Park, female   DOB: 57 y.o.   MRN: [id number]   HPI Patient states improving quite a bit with discomfort in the plantar aspect left heel with the right one significantly better   ROS      Objective:  Physical Exam  Neurovascular status intact with patient's left heel showing moderate discomfort and minimal discomfort currently right heel     Assessment:  Plantar fasciitis present left with improvement of the significant nature right     Plan:  Sterile prep left administered and I injected the left plantar fascia 3 mg Kenalog 5 Milgram Xylocaine at its insertion into the calcaneus and advised on continued supportive shoes and not going barefoot.  Reappoint as needed

## 2018-03-19 ENCOUNTER — Ambulatory Visit: Payer: 59 | Admitting: Gastroenterology

## 2018-03-19 ENCOUNTER — Encounter: Payer: Self-pay | Admitting: Internal Medicine

## 2018-04-15 ENCOUNTER — Ambulatory Visit: Payer: 59 | Admitting: Gastroenterology

## 2018-05-06 ENCOUNTER — Ambulatory Visit: Payer: 59 | Admitting: Gastroenterology

## 2018-05-06 ENCOUNTER — Encounter: Payer: Self-pay | Admitting: Gastroenterology

## 2018-05-06 ENCOUNTER — Other Ambulatory Visit (INDEPENDENT_AMBULATORY_CARE_PROVIDER_SITE_OTHER): Payer: 59

## 2018-05-06 VITALS — BP 128/84 | HR 76 | Ht 67.0 in | Wt 278.5 lb

## 2018-05-06 DIAGNOSIS — R1084 Generalized abdominal pain: Secondary | ICD-10-CM

## 2018-05-06 DIAGNOSIS — R197 Diarrhea, unspecified: Secondary | ICD-10-CM

## 2018-05-06 DIAGNOSIS — K219 Gastro-esophageal reflux disease without esophagitis: Secondary | ICD-10-CM

## 2018-05-06 DIAGNOSIS — Z1211 Encounter for screening for malignant neoplasm of colon: Secondary | ICD-10-CM | POA: Diagnosis not present

## 2018-05-06 LAB — C-REACTIVE PROTEIN: CRP: 1.5 mg/dL (ref 0.5–20.0)

## 2018-05-06 LAB — ALT: ALT: 24 U/L (ref 0–35)

## 2018-05-06 LAB — SEDIMENTATION RATE: Sed Rate: 40 mm/hr — ABNORMAL HIGH (ref 0–30)

## 2018-05-06 MED ORDER — NA SULFATE-K SULFATE-MG SULF 17.5-3.13-1.6 GM/177ML PO SOLN: 1.0000 | ORAL | 0 refills | Status: AC

## 2018-05-06 NOTE — Patient Instructions (Signed)
Your provider has requested that you go to the basement level for lab work before leaving today. Press "B" on the elevator. The lab is located at the first door on the left as you exit the elevator.  You have been scheduled for an endoscopy and colonoscopy. Please follow the written instructions given to you at your visit today. Please pick up your prep supplies at the pharmacy within the next 1-3 days. If you use inhalers (even only as needed), please bring them with you on the day of your procedure. Your physician has requested that you go to www.startemmi.com and enter the access code given to you at your visit today. This web site gives a general overview about your procedure. However, you should still follow specific instructions given to you by our office regarding your preparation for the procedure.  Try a daily over the counter probiotic such as Align.

## 2018-05-06 NOTE — Progress Notes (Signed)
Referring Provider: Binnie Rail, MD Primary Care Physician:  Binnie Rail, MD   Reason for Consultation:  Diarrhea, "many digestive issues"   IMPRESSION:  Diarrhea Abdominal pain GERD No prior colon cancer screening  Chronic symptoms without alarm features. Differential is broad and includes irritable bowel syndrome, IBD, missed infection (such as giardia), food intolerance, microscopic colitis, thyroid disorder, other functional GI disease. Will consider porphyria given the associated flushing.   PLAN: Fecal calprotectin, ESR, CRP Giardia Urinary porphobilinogen, ALA, and total porphyrins EGD with duodena biopsies Colonoscopy with random colon biopsies Trial of probiotics in the meantime  I consented the patient at the bedside today discussing the risks, benefits, and alternatives to endoscopic evaluation. In particular, we discussed the risks that include, but are not limited to, reaction to medication, cardiopulmonary compromise, bleeding requiring blood transfusion, aspiration resulting in pneumonia, perforation requiring surgery, lack of diagnosis, severe illness requiring hospitalization, and even death. We reviewed the risk of missed lesion including polyps or even cancer. The patient acknowledges these risks and asks that we proceed.    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 Quay Burow. History obtained through the patient and review of her electronic health record.  25 years of GI symptoms. Immediately after eating she develops pain and bloating. Frequency of symptoms fluctuates. Pain migrates throughout the abdomen, almost as if she feels the food going through the colon. Flushes during postprandial stools. Followed by sweats. Nearly faints when symptoms are so bad.   Stopped dairy (milk, yogurt, cheese) for 10-12 years. Recently tolerated a pizza. Resumed dairy last year after eating the pizza. Notices no change in  symptoms.  No identified triggers with a food diary. Has not tried the elimination diet or removed gluten.   Eight episodes of fecal soiling over the last 5 years. Occurs without warning.  Baseline diarrhea although it is difficult to quantitate. No other associated symptoms. No identified exacerbating or relieving features.   Questions temporal association of symptoms with stress.   Sister has similar symptoms. Chipped her tooth when she fainted during an episode. Sister feels like she is lactose intolerant.   Also reports three to four days of terrible acid reflux. Associated shortness of breath.   No prior colonoscopy or colon cancer screening. No known family history of colon cancer or polyps.   Labs from 01/14/18 show a normal CMP except for a glucose of 101 and a normal CBC.  Past Medical History:  Diagnosis Date  . Allergy   . Anemia    history of anemia 5 yrs.  . Anxiety    situational  . Asthma    MILD / RARE  . Breast cancer of lower-outer quadrant of left female breast ([facility]) 01/12/2015  . Chronic headaches   . Claustrophobia   . Depression   . GERD (gastroesophageal reflux disease)   . Hyperlipidemia   . Hypertension    history of HTN and on meds til normal- no meds currently  . Pneumonia   . PONV (postoperative nausea and vomiting)    wasn't sure if it was related to morphine or epidural  . Shortness of breath dyspnea    on exertion    Past Surgical History:  Procedure Laterality Date  . ABDOMINAL HYSTERECTOMY     for abnormal cytology; Dr Irven [place], ovaries left  . CESAREAN SECTION    . GANGLION CYST EXCISION    . MASTECTOMY W/ SENTINEL NODE BIOPSY  Bilateral 03/14/2015  . MASTECTOMY W/ SENTINEL NODE BIOPSY Bilateral 03/14/2015   Procedure: BILATERAL MASTECTOMY WITH LEFT SENTINEL LYMPH NODE BIOPSY;  Surgeon: [name], MD;  Location: [facility];  Service: General;  Laterality: Bilateral;  . no colonoscopy     11/17/13 [facility] reviewed  . ROBOTIC ASSISTED  BILATERAL SALPINGO OOPHERECTOMY Bilateral 10/16/2015   Procedure: XI ROBOTIC ASSISTED BILATERAL SALPINGO OOPHORECTOMY ;  Surgeon: Everitt Amber, MD;  Location: [facility];  Service: Gynecology;  Laterality: Bilateral;  . TONSILLECTOMY    . VARICOSE VEIN SURGERY     as OP X2    Current Outpatient Medications  Medication Sig Dispense Refill  . albuterol (PROAIR HFA) 108 (90 Base) MCG/ACT inhaler Inhale 1-2 puffs into the lungs every 4 (four) hours as needed for wheezing or shortness of breath. 1 Inhaler 1  . aspirin-acetaminophen-caffeine (EXCEDRIN MIGRAINE) 250-250-65 MG tablet Take 2 tablets by mouth every 6 (six) hours as needed for headache.    . budesonide-formoterol (SYMBICORT) 80-4.5 MCG/ACT inhaler Inhale 2 puffs into the lungs 2 (two) times daily. 1 Inhaler 12  . cetirizine (ZYRTEC) 10 MG tablet Take 10 mg by mouth daily.    . diclofenac (VOLTAREN) 75 MG EC tablet Take 1 tablet (75 mg total) by mouth 2 (two) times daily. 50 tablet 2  . DULoxetine (CYMBALTA) 30 MG capsule TAKE 1 CAPSULE BY MOUTH EVERY DAY 90 capsule 3  . famotidine (PEPCID) 20 MG tablet One at bedtime (Patient taking differently: Take 20 mg by mouth as needed. ) 30 tablet 2  . furosemide (LASIX) 20 MG tablet Take 1 tablet (20 mg total) by mouth daily as needed for fluid or edema. 90 tablet 3  . pantoprazole (PROTONIX) 40 MG tablet Take 1 tablet by mouth daily.     No current facility-administered medications for this visit.     Allergies as of 05/06/2018 - Review Complete 05/06/2018  Allergen Reaction Noted  . Codeine Nausea Only 01/17/2015  . Morphine Nausea And Vomiting     Family History  Problem Relation Age of Onset  . Bipolar disorder Mother   . Breast cancer Mother   . Irritable bowel syndrome Mother   . Diabetes Father   . Kidney failure Father   . Heart failure Father   . COPD Father   . Heart disease Father   . Hyperlipidemia Father   . Bipolar disorder Sister   . Heart attack Maternal Grandfather         in late 83s  . Stroke Maternal Grandmother        in 27s    Social History   Socioeconomic History  . Marital status: Married    Spouse name: Not on file  . Number of children: 1  . Years of education: Not on file  . Highest education level: Not on file  Occupational History  . Occupation: Clinical cytogeneticist  Social Needs  . Financial resource strain: Not on file  . Food insecurity:    Worry: Not on file    Inability: Not on file  . Transportation needs:    Medical: Not on file    Non-medical: Not on file  Tobacco Use  . Smoking status: Former Smoker    Packs/day: 1.00    Years: 4.00    Pack years: 4.00    Types: Cigarettes    Last attempt to quit: 03/31/1984    Years since quitting: 34.1  . Smokeless tobacco: Never Used  . Tobacco comment: age [age] ,  up to 1 ppd  Substance and Sexual Activity  . Alcohol use: Yes    Alcohol/week: 1.0 standard drinks    Types: 1 Glasses of wine per week    Comment: OCCASIONAL  . Drug use: No  . Sexual activity: Not on file  Lifestyle  . Physical activity:    Days per week: Not on file    Minutes per session: Not on file  . Stress: Not on file  Relationships  . Social connections:    Talks on phone: Not on file    Gets together: Not on file    Attends religious service: Not on file    Active member of club or organization: Not on file    Attends meetings of clubs or organizations: Not on file    Relationship status: Not on file  . Intimate partner violence:    Fear of current or ex partner: Not on file    Emotionally abused: Not on file    Physically abused: Not on file    Forced sexual activity: Not on file  Other Topics Concern  . Not on file  Social History Narrative  . Not on file    Review of Systems: 12 system ROS is negative except as noted above.  Filed Weights   05/06/18 1042  Weight: 278 lb 8 oz (126.3 kg)    Physical Exam: Vital signs were reviewed. General:   Alert, well-nourished, pleasant and  cooperative in NAD Head:  Normocephalic and atraumatic. Eyes:  Sclera clear, no icterus.   Conjunctiva pink. Mouth:  No deformity or lesions.   Neck:  Supple; no thyromegaly. Lungs:  Clear throughout to auscultation.   No wheezes.  Heart:  Regular rate and rhythm; no murmurs Abdomen:  Soft, central obesity, nontender, normal bowel sounds. No rebound or guarding. No hepatosplenomegaly Rectal:  Deferred  Msk:  Symmetrical without gross deformities. Extremities:  No gross deformities or edema. Neurologic:  Alert and  oriented x4;  grossly nonfocal Skin:  No rash or bruise. Psych:  Alert and cooperative. Normal mood and affect.   Domonic Kimball L. Tarri Glenn, MD, MPH [facility] 05/06/2018, 11:12 AM

## 2018-05-11 LAB — PORPHYRINS, TOTAL PLASMA: Porphyrins: <0.1 ug/dL (ref 0.0–1.0)

## 2018-05-26 ENCOUNTER — Encounter: Payer: Self-pay | Admitting: Gastroenterology

## 2018-06-08 ENCOUNTER — Encounter: Payer: 59 | Admitting: Gastroenterology

## 2018-07-12 ENCOUNTER — Encounter: Payer: 59 | Admitting: Gastroenterology

## 2018-07-14 ENCOUNTER — Encounter: Payer: Self-pay | Admitting: Internal Medicine

## 2018-07-15 NOTE — Telephone Encounter (Signed)
Appointment made for tomorrow

## 2018-07-16 ENCOUNTER — Encounter: Payer: Self-pay | Admitting: Internal Medicine

## 2018-07-16 ENCOUNTER — Ambulatory Visit (INDEPENDENT_AMBULATORY_CARE_PROVIDER_SITE_OTHER): Payer: 59 | Admitting: Internal Medicine

## 2018-07-16 DIAGNOSIS — F3289 Other specified depressive episodes: Secondary | ICD-10-CM | POA: Diagnosis not present

## 2018-07-16 MED ORDER — DULOXETINE HCL 60 MG PO CPEP: 60.0000 mg | ORAL_CAPSULE | Freq: Every day | ORAL | 1 refills | Status: DC

## 2018-07-16 NOTE — Assessment & Plan Note (Signed)
Not ideally controlled Sounds like she has been somewhat depressed for a while-decreased motivation is 1 of the biggest symptoms she is experiencing Would like to increase medication We will try increasing Cymbalta to 60 mg daily If after 3-4 weeks she feels like she needs something more we can add Wellbutrin-she will let me know

## 2018-07-16 NOTE — Progress Notes (Signed)
Virtual Visit via Video Note  I connected with Jennifer Park on 07/16/18 at  3:45 PM EDT by a video enabled telemedicine application and verified that I am speaking with the correct person using two identifiers.   I discussed the limitations of evaluation and management by telemedicine and the availability of in person appointments. The patient expressed understanding and agreed to proceed.  The patient is currently in her parked car and I am in the office.    No referring provider.    History of Present Illness: This is an acute visit for depression  Even before covid-19 she was feeling depressed.   She has decreased motivation to go to work and even to do things around the house..  She started seeing her therapist again.  She talked to her last Monday - that morning she could not make herself to go to work and she really figured out that she probably was depressed.  She does not feel overly anxious.  She is still working and her work schedule is actually lighter.  Her therapist agreed with her she was depressed and would benefit with more medication.  Her therapist advised her that she may benefit from either increasing the Cymbalta or adding Wellbutrin.  She wanted to get my opinion and see if her medication could be increased.  She has no other concerns or symptoms.  There are no thoughts of suicide.   Social History   Socioeconomic History  . Marital status: Married    Spouse name: Not on file  . Number of children: 1  . Years of education: Not on file  . Highest education level: Not on file  Occupational History  . Occupation: Clinical cytogeneticist  Social Needs  . Financial resource strain: Not on file  . Food insecurity:    Worry: Not on file    Inability: Not on file  . Transportation needs:    Medical: Not on file    Non-medical: Not on file  Tobacco Use  . Smoking status: Former Smoker    Packs/day: 1.00    Years: 4.00    Pack years: 4.00    Types: Cigarettes    Last  attempt to quit: 03/31/1984    Years since quitting: 34.3  . Smokeless tobacco: Never Used  . Tobacco comment: age [age] , up to 1 ppd  Substance and Sexual Activity  . Alcohol use: Yes    Alcohol/week: 1.0 standard drinks    Types: 1 Glasses of wine per week    Comment: OCCASIONAL  . Drug use: No  . Sexual activity: Not on file  Lifestyle  . Physical activity:    Days per week: Not on file    Minutes per session: Not on file  . Stress: Not on file  Relationships  . Social connections:    Talks on phone: Not on file    Gets together: Not on file    Attends religious service: Not on file    Active member of club or organization: Not on file    Attends meetings of clubs or organizations: Not on file    Relationship status: Not on file  Other Topics Concern  . Not on file  Social History Narrative  . Not on file     Observations/Objective: Appears well in NAD Normal mood and affect.  Assessment and Plan:  See Problem List for Assessment and Plan of chronic medical problems.   Follow Up Instructions:    I discussed  the assessment and treatment plan with the patient. The patient was provided an opportunity to ask questions and all were answered. The patient agreed with the plan and demonstrated an understanding of the instructions.   The patient was advised to call back or seek an in-person evaluation if the symptoms worsen or if the condition fails to improve as anticipated.    Binnie Rail, MD

## 2018-07-19 ENCOUNTER — Encounter: Payer: 59 | Admitting: Gastroenterology

## 2018-08-02 ENCOUNTER — Telehealth: Payer: Self-pay | Admitting: *Deleted

## 2018-08-02 NOTE — Telephone Encounter (Signed)
Per Dr. Tarri Glenn, ok to schedule ECL now.  Spoke with pt and she needs a Monday appointment.  I told her I would call her back as soon as June schedule is available to set up appointment for a Monday.  I will send her new instructions by MyChart at that time

## 2018-08-09 NOTE — Telephone Encounter (Signed)
Pt scheduled for 09-20-18 at 9:30.  Pt states she has the prep at home.  New instructions sent via MyChart.

## 2018-08-16 ENCOUNTER — Encounter: Payer: Self-pay | Admitting: Internal Medicine

## 2018-08-18 MED ORDER — BUPROPION HCL ER (XL) 150 MG PO TB24: 150.0000 mg | ORAL_TABLET | Freq: Every day | ORAL | 1 refills | Status: DC

## 2018-09-01 ENCOUNTER — Other Ambulatory Visit: Payer: Self-pay | Admitting: Internal Medicine

## 2018-09-17 ENCOUNTER — Telehealth: Payer: Self-pay | Admitting: Gastroenterology

## 2018-09-17 NOTE — Telephone Encounter (Signed)

## 2018-09-20 ENCOUNTER — Encounter: Payer: 59 | Admitting: Gastroenterology

## 2018-09-20 ENCOUNTER — Ambulatory Visit (AMBULATORY_SURGERY_CENTER): Payer: Managed Care, Other (non HMO) | Admitting: Gastroenterology

## 2018-09-20 ENCOUNTER — Encounter: Payer: Self-pay | Admitting: Gastroenterology

## 2018-09-20 ENCOUNTER — Other Ambulatory Visit: Payer: Self-pay

## 2018-09-20 VITALS — BP 124/72 | HR 68 | Temp 97.6°F | Resp 23 | Ht 67.0 in | Wt 278.0 lb

## 2018-09-20 DIAGNOSIS — K635 Polyp of colon: Secondary | ICD-10-CM

## 2018-09-20 DIAGNOSIS — R197 Diarrhea, unspecified: Secondary | ICD-10-CM | POA: Diagnosis present

## 2018-09-20 DIAGNOSIS — D121 Benign neoplasm of appendix: Secondary | ICD-10-CM

## 2018-09-20 DIAGNOSIS — K219 Gastro-esophageal reflux disease without esophagitis: Secondary | ICD-10-CM | POA: Diagnosis not present

## 2018-09-20 MED ORDER — SODIUM CHLORIDE 0.9 % IV SOLN: 500.0000 mL | Freq: Once | INTRAVENOUS | Status: DC

## 2018-09-20 NOTE — Op Note (Signed)
[facility] Patient Name: Jennifer Park Procedure Date: 09/20/2018 9:32 AM MRN: [id number] Endoscopist: Thornton Park MD, MD Age: 58 Referring MD:  Date of Birth: 1960-08-17 Gender: Female Account #: [id number] Procedure:                Upper GI endoscopy Indications:              Generalized abdominal pain, Diarrhea                           Diarrhea                           Abdominal pain                           GERD                           No prior colon cancer screeningDiarrhea Medicines:                See the Anesthesia note for documentation of the                            administered medications Procedure:                Pre-Anesthesia Assessment:                           - Prior to the procedure, a History and Physical                            was performed, and patient medications and                            allergies were reviewed. The patient's tolerance of                            previous anesthesia was also reviewed. The risks                            and benefits of the procedure and the sedation                            options and risks were discussed with the patient.                            All questions were answered, and informed consent                            was obtained. Prior Anticoagulants: The patient has                            taken no previous anticoagulant or antiplatelet                            agents. ASA Grade Assessment: III - A patient with  severe systemic disease. After reviewing the risks                            and benefits, the patient was deemed in                            satisfactory condition to undergo the procedure.                           After obtaining informed consent, the endoscope was                            passed under direct vision. Throughout the                            procedure, the patient's blood pressure, pulse, and     oxygen saturations were monitored continuously. The                            Endoscope was introduced through the mouth, and                            advanced to the third part of duodenum. The upper                            GI endoscopy was accomplished without difficulty.                            The patient tolerated the procedure well. Scope In: Scope Out: Findings:                 The esophagus was normal.                           The entire examined stomach was normal. Biopsies                            were taken with a cold forceps for histology.                            Estimated blood loss was minimal.                           The examined duodenum was normal. Biopsies were                            taken with a cold forceps for histology. Estimated                            blood loss was minimal.                           The cardia and gastric fundus were normal on  retroflexion. Small fundic gland polyps were                            present.                           The exam was otherwise without abnormality. Complications:            No immediate complications. Estimated blood loss:                            Minimal. Estimated Blood Loss:     Estimated blood loss was minimal. Impression:               - Normal esophagus.                           - Normal stomach. Biopsied.                           - Normal examined duodenum. Biopsied.                           - The examination was otherwise normal. Recommendation:           - Discharge patient to home.                           - Resume regular diet today.                           - Continue present medications.                           - Await pathology results. Thornton Park MD, MD 09/20/2018 10:09:51 AM This report has been signed electronically.

## 2018-09-20 NOTE — Progress Notes (Signed)
Called to room to assist during endoscopic procedure.  Patient ID and intended procedure confirmed with present staff. Received instructions for my participation in the procedure from the performing physician.  

## 2018-09-20 NOTE — Op Note (Signed)
[facility] Patient Name: Jennifer Park Procedure Date: 09/20/2018 9:30 AM MRN: [id number] Endoscopist: Thornton Park MD, MD Age: 58 Referring MD:  Date of Birth: Feb 03, 1961 Gender: Female Account #: [id number] Procedure:                Colonoscopy Indications:              Chronic diarrhea. Abdominal pain. No known family                            history of colon cancer or polyps. No prior colon                            cancer screening. Medicines:                See the Anesthesia note for documentation of the                            administered medications Procedure:                Pre-Anesthesia Assessment:                           - Prior to the procedure, a History and Physical                            was performed, and patient medications and                            allergies were reviewed. The patient's tolerance of                            previous anesthesia was also reviewed. The risks                            and benefits of the procedure and the sedation                            options and risks were discussed with the patient.                            All questions were answered, and informed consent                            was obtained. Prior Anticoagulants: The patient has                            taken no previous anticoagulant or antiplatelet                            agents. ASA Grade Assessment: III - A patient with                            severe systemic disease. After reviewing the risks  and benefits, the patient was deemed in                            satisfactory condition to undergo the procedure.                           After obtaining informed consent, the colonoscope                            was passed under direct vision. Throughout the                            procedure, the patient's blood pressure, pulse, and                            oxygen saturations were monitored  continuously. The                            Model CF-HQ190L [id number]) scope was introduced                            through the anus and advanced to the the terminal                            ileum, with identification of the appendiceal                            orifice and IC valve. The colonoscopy was performed                            without difficulty. The patient tolerated the                            procedure well. The quality of the bowel                            preparation was good. The terminal ileum, ileocecal                            valve, appendiceal orifice, and rectum were                            photographed. Scope In: 9:49:57 AM Scope Out: 10:02:29 AM Scope Withdrawal Time: 0 hours 9 minutes 22 seconds  Total Procedure Duration: 0 hours 12 minutes 32 seconds  Findings:                 The perianal and digital rectal examinations were                            normal.                           A 2 mm polyp was found in the appendiceal orifice.  The polyp was sessile. The polyp was removed with a                            cold snare. Resection and retrieval were complete.                            Estimated blood loss was minimal.                           The colon (entire examined portion) appeared                            normal. Biopsies for histology were taken with a                            cold forceps from the entire colon for evaluation                            of microscopic colitis.                           The exam was otherwise without abnormality on                            direct and retroflexion views. Complications:            No immediate complications. Estimated blood loss:                            Minimal. Estimated Blood Loss:     Estimated blood loss was minimal. Impression:               - One 2 mm polyp at the appendiceal orifice,                            removed with a cold snare.  Resected and retrieved.                           - The entire examined colon is normal. Biopsied.                           - The examination was otherwise normal on direct                            and retroflexion views. Recommendation:           - Discharge patient to home.                           - Resume regular diet today.                           - Continue present medications.                           - Await pathology results.                           -  Repeat colonoscopy in 7 years for surveillance if                            the polyp is an adenoma, otherwise, continue with                            routine colon cancer screening with another                            colonoscopy in 10 years, or earlier with any new                            symptoms. Thornton Park MD, MD 09/20/2018 10:13:48 AM This report has been signed electronically.

## 2018-09-20 NOTE — Progress Notes (Signed)
Jennifer Park- temp Jennifer Park- vitals 

## 2018-09-20 NOTE — Patient Instructions (Signed)
Handout on polyps given  YOU HAD AN ENDOSCOPIC PROCEDURE TODAY AT [facility]:   Refer to the procedure report that was given to you for any specific questions about what was found during the examination.  If the procedure report does not answer your questions, please call your gastroenterologist to clarify.  If you requested that your care partner not be given the details of your procedure findings, then the procedure report has been included in a sealed envelope for you to review at your convenience later.  YOU SHOULD EXPECT: Some feelings of bloating in the abdomen. Passage of more gas than usual.  Walking can help get rid of the air that was put into your GI tract during the procedure and reduce the bloating. If you had a lower endoscopy (such as a colonoscopy or flexible sigmoidoscopy) you may notice spotting of blood in your stool or on the toilet paper. If you underwent a bowel prep for your procedure, you may not have a normal bowel movement for a few days.  Please Note:  You might notice some irritation and congestion in your nose or some drainage.  This is from the oxygen used during your procedure.  There is no need for concern and it should clear up in a day or so.  SYMPTOMS TO REPORT IMMEDIATELY:   Following lower endoscopy (colonoscopy or flexible sigmoidoscopy):  Excessive amounts of blood in the stool  Significant tenderness or worsening of abdominal pains  Swelling of the abdomen that is new, acute  Fever of 100F or higher   Following upper endoscopy (EGD)  Vomiting of blood or coffee ground material  New chest pain or pain under the shoulder blades  Painful or persistently difficult swallowing  New shortness of breath  Fever of 100F or higher  Black, tarry-looking stools  For urgent or emergent issues, a gastroenterologist can be reached at any hour by calling [phone].   DIET:  We do recommend a small meal at first, but then you may proceed to  your regular diet.  Drink plenty of fluids but you should avoid alcoholic beverages for 24 hours.  ACTIVITY:  You should plan to take it easy for the rest of today and you should NOT DRIVE or use heavy machinery until tomorrow (because of the sedation medicines used during the test).    FOLLOW UP: Our staff will call the number listed on your records 48-72 hours following your procedure to check on you and address any questions or concerns that you may have regarding the information given to you following your procedure. If we do not reach you, we will leave a message.  We will attempt to reach you two times.  During this call, we will ask if you have developed any symptoms of COVID 19. If you develop any symptoms (ie: fever, flu-like symptoms, shortness of breath, cough etc.) before then, please call [phone].  If you test positive for Covid 19 in the 2 weeks post procedure, please call and report this information to [place].    If any biopsies were taken you will be contacted by phone or by letter within the next 1-3 weeks.  Please call us at [phone] if you have not heard about the biopsies in 3 weeks.    SIGNATURES/CONFIDENTIALITY: You and/or your care partner have signed paperwork which will be entered into your electronic medical record.  These signatures attest to the fact that that the information above on your After Visit Summary  has been reviewed and is understood.  Full responsibility of the confidentiality of this discharge information lies with you and/or your care-partner. 

## 2018-09-20 NOTE — Progress Notes (Signed)
Pt's states no medical or surgical changes since previsit or office visit. 

## 2018-09-20 NOTE — Progress Notes (Signed)
PT taken to PACU. Monitors in place. VSS. Report given to RN. 

## 2018-09-22 ENCOUNTER — Telehealth: Payer: Self-pay

## 2018-09-22 NOTE — Telephone Encounter (Signed)
  Follow up Call-  Call back number 09/20/2018  Post procedure Call Back phone  # [phone]  Permission to leave phone message Yes  Some recent data might be hidden     Patient questions:  Do you have a fever, pain , or abdominal swelling? No. Pain Score  0 *  Have you tolerated food without any problems? Yes.    Have you been able to return to your normal activities? Yes.    Do you have any questions about your discharge instructions: Diet   No. Medications  No. Follow up visit  No.  Do you have questions or concerns about your Care? No.  Actions: * If pain score is 4 or above: 1. No action needed, pain <4.Have you developed a fever since your procedure? no  2.   Have you had an respiratory symptoms (SOB or cough) since your procedure? no  3.   Have you tested positive for COVID 19 since your procedure no  4.   Have you had any family members/close contacts diagnosed with the COVID 19 since your procedure?  no   If yes to any of these questions please route to Joylene John, RN and Alphonsa Gin, Therapist, sports.

## 2018-09-26 ENCOUNTER — Encounter: Payer: Self-pay | Admitting: Gastroenterology

## 2019-01-24 ENCOUNTER — Other Ambulatory Visit: Payer: Self-pay

## 2019-01-24 ENCOUNTER — Ambulatory Visit (INDEPENDENT_AMBULATORY_CARE_PROVIDER_SITE_OTHER): Payer: Managed Care, Other (non HMO)

## 2019-01-24 DIAGNOSIS — Z299 Encounter for prophylactic measures, unspecified: Secondary | ICD-10-CM

## 2019-01-24 DIAGNOSIS — Z23 Encounter for immunization: Secondary | ICD-10-CM

## 2019-02-07 ENCOUNTER — Other Ambulatory Visit: Payer: Self-pay | Admitting: Internal Medicine

## 2019-02-11 ENCOUNTER — Other Ambulatory Visit: Payer: Self-pay | Admitting: Internal Medicine

## 2019-03-15 ENCOUNTER — Other Ambulatory Visit: Payer: Self-pay | Admitting: Internal Medicine

## 2019-03-18 ENCOUNTER — Other Ambulatory Visit: Payer: Managed Care, Other (non HMO)

## 2019-03-30 ENCOUNTER — Other Ambulatory Visit: Payer: Self-pay | Admitting: Internal Medicine

## 2019-04-02 ENCOUNTER — Encounter: Payer: Self-pay | Admitting: Internal Medicine

## 2019-05-26 NOTE — Patient Instructions (Addendum)
  Blood work was ordered.  Have it done in the next couple of weeks.    Medications reviewed and updated.  Changes include :  none   Your prescription(s) have been submitted to your pharmacy. Please take as directed and contact our office if you believe you are having problem(s) with the medication(s).    Please followup in 6 months

## 2019-05-26 NOTE — Progress Notes (Signed)
Subjective:    Patient ID: Jennifer Park, female    DOB: May 19, 1960, 59 y.o.   MRN: [id number]  HPI The patient is here for follow up of their chronic medical problems, including GERD, anxiety, depression, cough variant asthma, hyperglycemia   She is taking all of her medications as prescribed.    She has separated from her husband.  She is eating differently and eating less.  She only eats when she is hungry.  She has lost 40 lbs.  She is walking for exercsie.    Her GERD comes and goes.  She is taking the pantoprazole daily, but not 30 minutes prior to meal.  She takes Pepcid as needed.  Some days she takes Pepcid almost daily.  She has not seen any reason for the increase GERD such as specific foods.  She was unsure if she needed to do something different.  She is happy with her other medications and feels like everything is working well.  Medications and allergies reviewed with patient and updated if appropriate.  Patient Active Problem List   Diagnosis Date Noted  . Depression 01/14/2018  . Diarrhea 01/14/2018  . S/P bilateral mastectomy 12/21/2017  . Snoring 01/06/2017  . Periodic limb movements of sleep 01/06/2017  . Morbid obesity due to excess calories ([place]) 10/02/2016  . Hyperglycemia 08/06/2016  . Cough variant asthma  vs UACS/ vcd 08/06/2016  . Dyspnea on exertion 08/06/2016  . Arthralgia 08/06/2016  . Chronic fatigue 08/06/2016  . GERD (gastroesophageal reflux disease) 05/23/2015  . DCIS (ductal carcinoma in situ) 03/14/2015  . Breast cancer of lower-outer quadrant of left female breast ([place]) 01/12/2015  . Adhesive capsulitis 11/23/2013  . Subacromial bursitis 11/23/2013  . Varicose veins 11/17/2013  . Edema 11/17/2013  . Hyperlipidemia 04/16/2010  . Anxiety 12/15/2006  . DYSFUNCTION, BLADDER NEC 12/15/2006    Current Outpatient Medications on File Prior to Visit  Medication Sig Dispense Refill  . albuterol (PROAIR HFA) 108 (90 Base) MCG/ACT inhaler  Inhale 1-2 puffs into the lungs every 4 (four) hours as needed for wheezing or shortness of breath. 1 Inhaler 1  . aspirin-acetaminophen-caffeine (EXCEDRIN MIGRAINE) 250-250-65 MG tablet Take 2 tablets by mouth every 6 (six) hours as needed for headache.    . budesonide-formoterol (SYMBICORT) 80-4.5 MCG/ACT inhaler Inhale 2 puffs into the lungs 2 (two) times daily. 1 Inhaler 12  . buPROPion (WELLBUTRIN XL) 150 MG 24 hr tablet TAKE 1 TABLET BY MOUTH EVERY DAY 90 tablet 1  . cetirizine (ZYRTEC) 10 MG tablet Take 10 mg by mouth daily.    . DULoxetine (CYMBALTA) 60 MG capsule TAKE 1 CAPSULE BY MOUTH EVERY DAY 90 capsule 1  . famotidine (PEPCID) 20 MG tablet One at bedtime (Patient taking differently: Take 20 mg by mouth as needed. ) 30 tablet 2  . furosemide (LASIX) 20 MG tablet Take 1 tablet (20 mg total) by mouth daily as needed for fluid or edema. Overdue for Annual appt must see provider for future refills 30 tablet 0  . pantoprazole (PROTONIX) 40 MG tablet TAKE 1 TABLET (40 MG TOTAL) BY MOUTH DAILY. TAKE 30-60 MIN BEFORE FIRST MEAL OF THE DAY 90 tablet 1   No current facility-administered medications on file prior to visit.    Past Medical History:  Diagnosis Date  . Allergy   . Anemia    history of anemia 5 yrs.  . Anxiety    situational  . Asthma    MILD / RARE  .  Breast cancer of lower-outer quadrant of left female breast ([place]) 01/12/2015  . Chronic headaches   . Claustrophobia   . Depression   . GERD (gastroesophageal reflux disease)   . Hyperlipidemia   . Hypertension    history of HTN and on meds til normal- no meds currently  . Pneumonia   . PONV (postoperative nausea and vomiting)    wasn't sure if it was related to morphine or epidural  . Shortness of breath dyspnea    on exertion    Past Surgical History:  Procedure Laterality Date  . ABDOMINAL HYSTERECTOMY     for abnormal cytology; Dr Irven [place], ovaries left  . CESAREAN SECTION    . GANGLION CYST EXCISION    .  MASTECTOMY W/ SENTINEL NODE BIOPSY Bilateral 03/14/2015  . MASTECTOMY W/ SENTINEL NODE BIOPSY Bilateral 03/14/2015   Procedure: BILATERAL MASTECTOMY WITH LEFT SENTINEL LYMPH NODE BIOPSY;  Surgeon: [name], MD;  Location: [facility];  Service: General;  Laterality: Bilateral;  . no colonoscopy     11/17/13 [place] reviewed  . ROBOTIC ASSISTED BILATERAL SALPINGO OOPHERECTOMY Bilateral 10/16/2015   Procedure: XI ROBOTIC ASSISTED BILATERAL SALPINGO OOPHORECTOMY ;  Surgeon: Everitt Amber, MD;  Location: [facility];  Service: Gynecology;  Laterality: Bilateral;  . TONSILLECTOMY    . VARICOSE VEIN SURGERY     as OP X2    Social History   Socioeconomic History  . Marital status: Married    Spouse name: Not on file  . Number of children: 1  . Years of education: Not on file  . Highest education level: Not on file  Occupational History  . Occupation: Clinical cytogeneticist  Tobacco Use  . Smoking status: Former Smoker    Packs/day: 1.00    Years: 4.00    Pack years: 4.00    Types: Cigarettes    Quit date: 03/31/1984    Years since quitting: 35.1  . Smokeless tobacco: Never Used  . Tobacco comment: age [age] , up to 1 ppd  Substance and Sexual Activity  . Alcohol use: Yes    Alcohol/week: 1.0 standard drinks    Types: 1 Glasses of wine per week    Comment: OCCASIONAL  . Drug use: No  . Sexual activity: Not on file  Other Topics Concern  . Not on file  Social History Narrative  . Not on file   Social Determinants of Health   Financial Resource Strain:   . Difficulty of Paying Living Expenses: Not on file  Food Insecurity:   . Worried About Charity fundraiser in the Last Year: Not on file  . Ran Out of Food in the Last Year: Not on file  Transportation Needs:   . Lack of Transportation (Medical): Not on file  . Lack of Transportation (Non-Medical): Not on file  Physical Activity:   . Days of Exercise per Week: Not on file  . Minutes of Exercise per Session: Not on file  Stress:   . Feeling  of Stress : Not on file  Social Connections:   . Frequency of Communication with Friends and Family: Not on file  . Frequency of Social Gatherings with Friends and Family: Not on file  . Attends Religious Services: Not on file  . Active Member of Clubs or Organizations: Not on file  . Attends Archivist Meetings: Not on file  . Marital Status: Not on file    Family History  Problem Relation Age of Onset  . Bipolar  disorder Mother   . Breast cancer Mother   . Irritable bowel syndrome Mother   . Diabetes Father   . Kidney failure Father   . Heart failure Father   . COPD Father   . Heart disease Father   . Hyperlipidemia Father   . Bipolar disorder Sister   . Heart attack Maternal Grandfather        in late 40s  . Stroke Maternal Grandmother        in 90s    Review of Systems  Constitutional: Negative for chills and fever.  Respiratory: Positive for cough (related to asthma, gerd) and shortness of breath (intermittent - gerd/asthma). Negative for wheezing.   Cardiovascular: Positive for leg swelling (controlled). Negative for chest pain and palpitations.  Neurological: Negative for light-headedness and headaches.  Psychiatric/Behavioral: Positive for dysphoric mood. The patient is nervous/anxious.        Objective:   Vitals:   05/27/19 1549  BP: 122/80  Pulse: 87  Resp: 16  Temp: 98.7 F (37.1 C)  SpO2: 99%   BP Readings from Last 3 Encounters:  05/27/19 122/80  09/20/18 124/72  05/06/18 128/84   Wt Readings from Last 3 Encounters:  05/27/19 238 lb (108 kg)  09/20/18 278 lb (126.1 kg)  05/06/18 278 lb 8 oz (126.3 kg)   Body mass index is 37.28 kg/m.   Physical Exam    Constitutional: Appears well-developed and well-nourished. No distress.  HENT:  Head: Normocephalic and atraumatic.  Neck: Neck supple. No tracheal deviation present. No thyromegaly present.  No cervical lymphadenopathy Cardiovascular: Normal rate, regular rhythm and normal heart  sounds.   No murmur heard. No carotid bruit .  No edema Pulmonary/Chest: Effort normal and breath sounds normal. No respiratory distress. No has no wheezes. No rales.  Skin: Skin is warm and dry. Not diaphoretic.  Psychiatric: Normal mood and affect. Behavior is normal.      Assessment & Plan:    See Problem List for Assessment and Plan of chronic medical problems.    This visit occurred during the SARS-CoV-2 public health emergency.  Safety protocols were in place, including screening questions prior to the visit, additional usage of staff PPE, and extensive cleaning of exam room while observing appropriate contact time as indicated for disinfecting solutions.

## 2019-05-27 ENCOUNTER — Ambulatory Visit: Payer: Managed Care, Other (non HMO) | Admitting: Internal Medicine

## 2019-05-27 ENCOUNTER — Encounter: Payer: Self-pay | Admitting: Internal Medicine

## 2019-05-27 ENCOUNTER — Other Ambulatory Visit: Payer: Self-pay

## 2019-05-27 VITALS — BP 122/80 | HR 87 | Temp 98.7°F | Resp 16 | Ht 67.0 in | Wt 238.0 lb

## 2019-05-27 DIAGNOSIS — E782 Mixed hyperlipidemia: Secondary | ICD-10-CM

## 2019-05-27 DIAGNOSIS — K219 Gastro-esophageal reflux disease without esophagitis: Secondary | ICD-10-CM

## 2019-05-27 DIAGNOSIS — Z23 Encounter for immunization: Secondary | ICD-10-CM

## 2019-05-27 DIAGNOSIS — R600 Localized edema: Secondary | ICD-10-CM

## 2019-05-27 DIAGNOSIS — F3289 Other specified depressive episodes: Secondary | ICD-10-CM

## 2019-05-27 DIAGNOSIS — R739 Hyperglycemia, unspecified: Secondary | ICD-10-CM

## 2019-05-27 DIAGNOSIS — F419 Anxiety disorder, unspecified: Secondary | ICD-10-CM

## 2019-05-27 DIAGNOSIS — J45991 Cough variant asthma: Secondary | ICD-10-CM

## 2019-05-27 MED ORDER — FAMOTIDINE 20 MG PO TABS: 20.0000 mg | ORAL_TABLET | ORAL | 3 refills | Status: DC | PRN

## 2019-05-27 MED ORDER — CETIRIZINE HCL 10 MG PO TABS: 10.0000 mg | ORAL_TABLET | Freq: Every day | ORAL | 1 refills | Status: DC

## 2019-05-27 MED ORDER — BUPROPION HCL ER (XL) 150 MG PO TB24: 150.0000 mg | ORAL_TABLET | Freq: Every day | ORAL | 1 refills | Status: DC

## 2019-05-27 MED ORDER — PANTOPRAZOLE SODIUM 40 MG PO TBEC: DELAYED_RELEASE_TABLET | ORAL | 1 refills | Status: DC

## 2019-05-27 MED ORDER — ALBUTEROL SULFATE HFA 108 (90 BASE) MCG/ACT IN AERS: 1.0000 | INHALATION_SPRAY | RESPIRATORY_TRACT | 1 refills | Status: DC | PRN

## 2019-05-27 MED ORDER — DULOXETINE HCL 60 MG PO CPEP: ORAL_CAPSULE | ORAL | 1 refills | Status: DC

## 2019-05-27 MED ORDER — BUDESONIDE-FORMOTEROL FUMARATE 80-4.5 MCG/ACT IN AERO: 2.0000 | INHALATION_SPRAY | Freq: Two times a day (BID) | RESPIRATORY_TRACT | 3 refills | Status: AC

## 2019-05-27 MED ORDER — FUROSEMIDE 20 MG PO TABS: 20.0000 mg | ORAL_TABLET | Freq: Every day | ORAL | 1 refills | Status: DC | PRN

## 2019-05-27 NOTE — Assessment & Plan Note (Signed)
Chronic Controlled Taking Symbicort twice daily as needed only Albuterol as needed Continue above Discussed the importance of GERD control

## 2019-05-27 NOTE — Assessment & Plan Note (Signed)
Chronic Check lipid panel, TSH Lifestyle controlled Continue exercise and healthy diet

## 2019-05-27 NOTE — Assessment & Plan Note (Signed)
Chronic Related to venous insufficiency Taking Lasix 20 mg daily Weight loss has helped, also walking regularly CMP

## 2019-05-27 NOTE — Assessment & Plan Note (Addendum)
chronic Taking Protonix daily - not 30 min prior to a meal Takes pepcid prn GERD flares comes and goes Try taking protonix 30 minutes prior to a meal Advised that when she does have increased GERD she should be taking her Pepcid daily and to decrease that only once her GERD is well compressed controlled

## 2019-05-27 NOTE — Assessment & Plan Note (Signed)
Controlled, stable Chronic Continue duloxetine and Wellbutrin at current doses

## 2019-05-27 NOTE — Assessment & Plan Note (Signed)
Chronic Controlled, stable Continue Cymbalta and duloxetine at current doses

## 2019-05-27 NOTE — Assessment & Plan Note (Signed)
Check A1c Continue regular exercise and weight loss efforts

## 2019-07-02 ENCOUNTER — Other Ambulatory Visit: Payer: Self-pay | Admitting: Internal Medicine

## 2019-07-06 ENCOUNTER — Other Ambulatory Visit: Payer: Self-pay | Admitting: Internal Medicine

## 2019-08-24 NOTE — Progress Notes (Signed)
Subjective:    Patient ID: Jennifer Park, female    DOB: [birth year], 59 y.o.   MRN: [id number]  HPI The patient is here for an acute visit.   Decreased hearing right ear:  It started three days ago.  She has had some minimal noise it the ear when it is perfectly quiet at night, but that is not new.  She denies ear pain, discharge and cold symptoms.   She denies left ear symptoms.    Medications and allergies reviewed with patient and updated if appropriate.  Patient Active Problem List   Diagnosis Date Noted  . Depression 01/14/2018  . Diarrhea 01/14/2018  . S/P bilateral mastectomy 12/21/2017  . Snoring 01/06/2017  . Periodic limb movements of sleep 01/06/2017  . Morbid obesity due to excess calories ([place]) 10/02/2016  . Hyperglycemia 08/06/2016  . Cough variant asthma  vs UACS/ vcd 08/06/2016  . Dyspnea on exertion 08/06/2016  . Arthralgia 08/06/2016  . Chronic fatigue 08/06/2016  . GERD (gastroesophageal reflux disease) 05/23/2015  . DCIS (ductal carcinoma in situ) 03/14/2015  . Breast cancer of lower-outer quadrant of left female breast ([place]) 01/12/2015  . Adhesive capsulitis 11/23/2013  . Subacromial bursitis 11/23/2013  . Varicose veins 11/17/2013  . Edema 11/17/2013  . Hyperlipidemia 04/16/2010  . Anxiety 12/15/2006  . DYSFUNCTION, BLADDER NEC 12/15/2006    Current Outpatient Medications on File Prior to Visit  Medication Sig Dispense Refill  . albuterol (PROAIR HFA) 108 (90 Base) MCG/ACT inhaler Inhale 1-2 puffs into the lungs every 4 (four) hours as needed for wheezing or shortness of breath. 18 g 1  . aspirin-acetaminophen-caffeine (EXCEDRIN MIGRAINE) [id number] MG tablet Take 2 tablets by mouth every 6 (six) hours as needed for headache.    . budesonide-formoterol (SYMBICORT) 80-4.5 MCG/ACT inhaler Inhale 2 puffs into the lungs 2 (two) times daily. 1 Inhaler 3  . buPROPion (WELLBUTRIN XL) 150 MG 24 hr tablet Take 1 tablet (150 mg total) by mouth daily. 90  tablet 1  . cetirizine (ZYRTEC) 10 MG tablet Take 1 tablet (10 mg total) by mouth daily. 90 tablet 1  . DULoxetine (CYMBALTA) 60 MG capsule TAKE 1 CAPSULE BY MOUTH EVERY DAY 90 capsule 1  . famotidine (PEPCID) 20 MG tablet Take 1 tablet (20 mg total) by mouth as needed. 90 tablet 3  . furosemide (LASIX) 20 MG tablet Take 1 tablet (20 mg total) by mouth daily as needed for fluid or edema. 90 tablet 1  . pantoprazole (PROTONIX) 40 MG tablet TAKE 1 TABLET (40 MG TOTAL) BY MOUTH DAILY. TAKE 30-60 MIN BEFORE FIRST MEAL OF THE DAY 90 tablet 1   No current facility-administered medications on file prior to visit.    Past Medical History:  Diagnosis Date  . Allergy   . Anemia    history of anemia 5 yrs.  . Anxiety    situational  . Asthma    MILD / RARE  . Breast cancer of lower-outer quadrant of left female breast ([place]) 01/12/2015  . Chronic headaches   . Claustrophobia   . Depression   . GERD (gastroesophageal reflux disease)   . Hyperlipidemia   . Hypertension    history of HTN and on meds til normal- no meds currently  . Pneumonia   . PONV (postoperative nausea and vomiting)    wasn't sure if it was related to morphine or epidural  . Shortness of breath dyspnea    on exertion    Past Surgical History:  Procedure Laterality Date  . ABDOMINAL HYSTERECTOMY     for abnormal cytology; Dr Irven [place], ovaries left  . CESAREAN SECTION    . GANGLION CYST EXCISION    . MASTECTOMY W/ SENTINEL NODE BIOPSY Bilateral 03/14/2015  . MASTECTOMY W/ SENTINEL NODE BIOPSY Bilateral 03/14/2015   Procedure: BILATERAL MASTECTOMY WITH LEFT SENTINEL LYMPH NODE BIOPSY;  Surgeon: [name], MD;  Location: [facility];  Service: General;  Laterality: Bilateral;  . no colonoscopy     11/17/13 [place] reviewed  . ROBOTIC ASSISTED BILATERAL SALPINGO OOPHERECTOMY Bilateral 10/16/2015   Procedure: XI ROBOTIC ASSISTED BILATERAL SALPINGO OOPHORECTOMY ;  Surgeon: Everitt Amber, MD;  Location: [facility];  Service: Gynecology;   Laterality: Bilateral;  . TONSILLECTOMY    . VARICOSE VEIN SURGERY     as OP X2    Social History   Socioeconomic History  . Marital status: Married    Spouse name: Not on file  . Number of children: 1  . Years of education: Not on file  . Highest education level: Not on file  Occupational History  . Occupation: Clinical cytogeneticist  Tobacco Use  . Smoking status: Former Smoker    Packs/day: 1.00    Years: 4.00    Pack years: 4.00    Types: Cigarettes    Quit date: 03/31/1984    Years since quitting: 35.4  . Smokeless tobacco: Never Used  . Tobacco comment: age [age] , up to 1 ppd  Substance and Sexual Activity  . Alcohol use: Yes    Alcohol/week: 1.0 standard drinks    Types: 1 Glasses of wine per week    Comment: OCCASIONAL  . Drug use: No  . Sexual activity: Not on file  Other Topics Concern  . Not on file  Social History Narrative  . Not on file   Social Determinants of Health   Financial Resource Strain:   . Difficulty of Paying Living Expenses:   Food Insecurity:   . Worried About Charity fundraiser in the Last Year:   . Arboriculturist in the Last Year:   Transportation Needs:   . Film/video editor (Medical):   Marland Kitchen Lack of Transportation (Non-Medical):   Physical Activity:   . Days of Exercise per Week:   . Minutes of Exercise per Session:   Stress:   . Feeling of Stress :   Social Connections:   . Frequency of Communication with Friends and Family:   . Frequency of Social Gatherings with Friends and Family:   . Attends Religious Services:   . Active Member of Clubs or Organizations:   . Attends Archivist Meetings:   Marland Kitchen Marital Status:     Family History  Problem Relation Age of Onset  . Bipolar disorder Mother   . Breast cancer Mother   . Irritable bowel syndrome Mother   . Diabetes Father   . Kidney failure Father   . Heart failure Father   . COPD Father   . Heart disease Father   . Hyperlipidemia Father   . Bipolar disorder  Sister   . Heart attack Maternal Grandfather        in late 12s  . Stroke Maternal Grandmother        in 90s    Review of Systems  Constitutional: Negative for chills and fever.  HENT: Positive for hearing loss and tinnitus (right  - not new). Negative for congestion, ear discharge, ear pain, sinus pain and sore  throat.   Respiratory: Negative for cough and shortness of breath.   Neurological: Positive for light-headedness (occ, chronic when she stands up). Negative for dizziness and headaches.       Objective:   Vitals:   08/25/19 0955  BP: 124/78  Pulse: 90  Resp: 16  Temp: 98 F (36.7 C)  SpO2: 97%   BP Readings from Last 3 Encounters:  08/25/19 124/78  05/27/19 122/80  09/20/18 124/72   Wt Readings from Last 3 Encounters:  08/25/19 234 lb (106.1 kg)  05/27/19 238 lb (108 kg)  09/20/18 278 lb (126.1 kg)   Body mass index is 36.65 kg/m.   Physical Exam    PRE-PROCEDURE EXAM: Right TM cannot be visualized due to total occlusion/impaction of the ear canal. PROCEDURE INDICATION: remove wax to visualize ear drum & relieve discomfort CONSENT:  Verbal  PROCEDURE NOTE:   RIGHT EAR:  The CMA used a metal wax curette under direct vision with an otoscope to free the wax bolus from the ear wall and then successfully removed a small bit of wax. The ear was then irrigated with warm water to remove the remaining wax. POST- PROCEDURE EXAM: TMs successfully visualized and found to have no erythema.  100% of wax was removed.  hearing improved        Assessment & Plan:    See Problem List for Assessment and Plan of chronic medical problems.    This visit occurred during the SARS-CoV-2 public health emergency.  Safety protocols were in place, including screening questions prior to the visit, additional usage of staff PPE, and extensive cleaning of exam room while observing appropriate contact time as indicated for disinfecting solutions.

## 2019-08-25 ENCOUNTER — Ambulatory Visit: Payer: Managed Care, Other (non HMO) | Admitting: Internal Medicine

## 2019-08-25 ENCOUNTER — Other Ambulatory Visit: Payer: Self-pay

## 2019-08-25 ENCOUNTER — Encounter: Payer: Self-pay | Admitting: Internal Medicine

## 2019-08-25 DIAGNOSIS — H6121 Impacted cerumen, right ear: Secondary | ICD-10-CM | POA: Insufficient documentation

## 2019-08-25 DIAGNOSIS — H9191 Unspecified hearing loss, right ear: Secondary | ICD-10-CM | POA: Diagnosis not present

## 2019-08-25 NOTE — Assessment & Plan Note (Signed)
Acute Significant decreased hearing in R ear x 3 days R ear impacted with cerumen - cause of decreased hearing Ear successfully cleaned out and hearing returned to normal.

## 2019-08-25 NOTE — Patient Instructions (Signed)
Your right ear was cleaned out today.      Earwax Buildup, Adult The ears produce a substance called earwax that helps keep bacteria out of the ear and protects the skin in the ear canal. Occasionally, earwax can build up in the ear and cause discomfort or hearing loss. What increases the risk? This condition is more likely to develop in people who:  Are female.  Are elderly.  Naturally produce more earwax.  Clean their ears often with cotton swabs.  Use earplugs often.  Use in-ear headphones often.  Wear hearing aids.  Have narrow ear canals.  Have earwax that is overly thick or sticky.  Have eczema.  Are dehydrated.  Have excess hair in the ear canal. What are the signs or symptoms? Symptoms of this condition include:  Reduced or muffled hearing.  A feeling of fullness in the ear or feeling that the ear is plugged.  Fluid coming from the ear.  Ear pain.  Ear itch.  Ringing in the ear.  Coughing.  An obvious piece of earwax that can be seen inside the ear canal. How is this diagnosed? This condition may be diagnosed based on:  Your symptoms.  Your medical history.  An ear exam. During the exam, your health care provider will look into your ear with an instrument called an otoscope. You may have tests, including a hearing test. How is this treated? This condition may be treated by:  Using ear drops to soften the earwax.  Having the earwax removed by a health care provider. The health care provider may: ? Flush the ear with water. ? Use an instrument that has a loop on the end (curette). ? Use a suction device.  Surgery to remove the wax buildup. This may be done in severe cases. Follow these instructions at home:   Take over-the-counter and prescription medicines only as told by your health care provider.  Do not put any objects, including cotton swabs, into your ear. You can clean the opening of your ear canal with a washcloth or facial  tissue.  Follow instructions from your health care provider about cleaning your ears. Do not over-clean your ears.  Drink enough fluid to keep your urine clear or pale yellow. This will help to thin the earwax.  Keep all follow-up visits as told by your health care provider. If earwax builds up in your ears often or if you use hearing aids, consider seeing your health care provider for routine, preventive ear cleanings. Ask your health care provider how often you should schedule your cleanings.  If you have hearing aids, clean them according to instructions from the manufacturer and your health care provider. Contact a health care provider if:  You have ear pain.  You develop a fever.  You have blood, pus, or other fluid coming from your ear.  You have hearing loss.  You have ringing in your ears that does not go away.  Your symptoms do not improve with treatment.  You feel like the room is spinning (vertigo). Summary  Earwax can build up in the ear and cause discomfort or hearing loss.  The most common symptoms of this condition include reduced or muffled hearing and a feeling of fullness in the ear or feeling that the ear is plugged.  This condition may be diagnosed based on your symptoms, your medical history, and an ear exam.  This condition may be treated by using ear drops to soften the earwax or by having the  earwax removed by a health care provider.  Do not put any objects, including cotton swabs, into your ear. You can clean the opening of your ear canal with a washcloth or facial tissue. This information is not intended to replace advice given to you by your health care provider. Make sure you discuss any questions you have with your health care provider. Document Revised: 02/27/2017 Document Reviewed: 05/28/2016 Elsevier Patient Education  2020 [employer].

## 2019-08-25 NOTE — Assessment & Plan Note (Signed)
Acute R ear successfully cleaned out  She tolerated procedure well

## 2019-08-25 NOTE — Progress Notes (Signed)
Patient consent obtained. Irrigation with water and peroxide performed. Full view of tympanic membranes after procedure.  Patient tolerated procedure well. Right ear clear

## 2019-11-25 ENCOUNTER — Other Ambulatory Visit: Payer: Self-pay | Admitting: Internal Medicine

## 2019-11-27 ENCOUNTER — Other Ambulatory Visit: Payer: Self-pay | Admitting: Internal Medicine

## 2020-01-18 ENCOUNTER — Other Ambulatory Visit: Payer: Self-pay | Admitting: Internal Medicine

## 2020-01-29 ENCOUNTER — Other Ambulatory Visit: Payer: Self-pay | Admitting: Internal Medicine

## 2020-03-03 ENCOUNTER — Other Ambulatory Visit: Payer: Self-pay | Admitting: Internal Medicine

## 2020-03-04 ENCOUNTER — Other Ambulatory Visit: Payer: Self-pay | Admitting: Internal Medicine

## 2020-04-04 ENCOUNTER — Other Ambulatory Visit: Payer: Self-pay | Admitting: Internal Medicine

## 2020-05-04 ENCOUNTER — Other Ambulatory Visit: Payer: Self-pay | Admitting: Internal Medicine

## 2020-05-14 ENCOUNTER — Other Ambulatory Visit: Payer: Self-pay | Admitting: Internal Medicine

## 2020-06-09 ENCOUNTER — Other Ambulatory Visit: Payer: Self-pay | Admitting: Internal Medicine

## 2020-06-11 ENCOUNTER — Other Ambulatory Visit: Payer: Self-pay | Admitting: Internal Medicine

## 2020-06-16 ENCOUNTER — Other Ambulatory Visit: Payer: Self-pay | Admitting: Internal Medicine

## 2020-06-18 ENCOUNTER — Other Ambulatory Visit: Payer: Self-pay

## 2020-07-12 ENCOUNTER — Other Ambulatory Visit: Payer: Self-pay | Admitting: Internal Medicine

## 2020-07-18 ENCOUNTER — Other Ambulatory Visit: Payer: Self-pay | Admitting: Internal Medicine

## 2020-07-31 ENCOUNTER — Other Ambulatory Visit: Payer: Self-pay | Admitting: Internal Medicine

## 2020-08-16 ENCOUNTER — Other Ambulatory Visit: Payer: Self-pay | Admitting: Internal Medicine

## 2020-09-15 ENCOUNTER — Other Ambulatory Visit: Payer: Self-pay | Admitting: Internal Medicine

## 2020-09-16 ENCOUNTER — Other Ambulatory Visit: Payer: Self-pay | Admitting: Internal Medicine

## 2020-10-23 ENCOUNTER — Other Ambulatory Visit: Payer: Self-pay | Admitting: Internal Medicine

## 2020-11-19 ENCOUNTER — Other Ambulatory Visit: Payer: Self-pay | Admitting: Internal Medicine

## 2020-11-22 ENCOUNTER — Telehealth: Payer: Self-pay | Admitting: Internal Medicine

## 2020-11-22 NOTE — Telephone Encounter (Signed)
1.Medication Requested: furosemide (LASIX) 20 MG tablet  2. Pharmacy (Name, [facility], [facility]): [id number] - [place], [place] - [address] AT [address]  Phone:  [phone] Fax:  [phone]   3. On Med List: yes  4. Last Visit with PCP: 05.27.21  5. Next visit date with PCP: 09.13.22   Agent: Please be advised that RX refills may take up to 3 business days. We ask that you follow-up with your pharmacy.

## 2020-11-23 ENCOUNTER — Other Ambulatory Visit: Payer: Self-pay

## 2020-11-23 MED ORDER — FUROSEMIDE 20 MG PO TABS: ORAL_TABLET | ORAL | 2 refills | Status: DC

## 2020-11-23 NOTE — Telephone Encounter (Signed)
Sent in today 

## 2020-12-10 ENCOUNTER — Other Ambulatory Visit: Payer: Self-pay

## 2020-12-10 NOTE — Progress Notes (Signed)
Subjective:    Patient ID: Jennifer Park, female    DOB: 31-Oct-1960, 60 y.o.   MRN: [id number]   This visit occurred during the SARS-CoV-2 public health emergency.  Safety protocols were in place, including screening questions prior to the visit, additional usage of staff PPE, and extensive cleaning of exam room while observing appropriate contact time as indicated for disinfecting solutions.    HPI She is here for a physical exam.   She is doing good.  Has a new job and got married.  Likely going to retire next year and move to [place] in a year or two.     Medications and allergies reviewed with patient and updated if appropriate.  Patient Active Problem List   Diagnosis Date Noted   Decreased hearing of right ear 08/25/2019   Depression 01/14/2018   Diarrhea 01/14/2018   S/P bilateral mastectomy 12/21/2017   Snoring 01/06/2017   Periodic limb movements of sleep 01/06/2017   Morbid obesity due to excess calories ([place]) 10/02/2016   Hyperglycemia 08/06/2016   Cough variant asthma  vs UACS/ vcd 08/06/2016   Dyspnea on exertion 08/06/2016   Arthralgia 08/06/2016   Chronic fatigue 08/06/2016   GERD (gastroesophageal reflux disease) 05/23/2015   DCIS (ductal carcinoma in situ) 03/14/2015   Breast cancer of lower-outer quadrant of left female breast ([place]) 01/12/2015   Adhesive capsulitis 11/23/2013   Subacromial bursitis 11/23/2013   Varicose veins 11/17/2013   Edema 11/17/2013   Hyperlipidemia 04/16/2010   Anxiety 12/15/2006   DYSFUNCTION, BLADDER NEC 12/15/2006    Current Outpatient Medications on File Prior to Visit  Medication Sig Dispense Refill   albuterol (PROAIR HFA) 108 (90 Base) MCG/ACT inhaler Inhale 1-2 puffs into the lungs every 4 (four) hours as needed for wheezing or shortness of breath. 18 g 1   aspirin-acetaminophen-caffeine (EXCEDRIN MIGRAINE) 250-250-65 MG tablet Take 2 tablets by mouth every 6 (six) hours as needed for headache.      budesonide-formoterol (SYMBICORT) 80-4.5 MCG/ACT inhaler Inhale 2 puffs into the lungs 2 (two) times daily. 1 Inhaler 3   DULoxetine (CYMBALTA) 60 MG capsule TAKE 1 CAPSULE BY MOUTH EVERY DAY 90 capsule 0   famotidine (PEPCID) 20 MG tablet TAKE 1 TABLET BY MOUTH AS NEEDED 90 tablet 3   furosemide (LASIX) 20 MG tablet TAKE 1 TABLET BY MOUTH DAILY AS NEEDED FOR SWELLING 30 tablet 2   pantoprazole (PROTONIX) 40 MG tablet TAKE 1 TABLET(40 MG) BY MOUTH DAILY 30 TO 60 MINUTES BEFORE FIRST MEAL OF THE DAY 90 tablet 1   No current facility-administered medications on file prior to visit.    Past Medical History:  Diagnosis Date   Allergy    Anemia    history of anemia 5 yrs.   Anxiety    situational   Asthma    MILD / RARE   Breast cancer of lower-outer quadrant of left female breast ([place]) 01/12/2015   Chronic headaches    Claustrophobia    Depression    GERD (gastroesophageal reflux disease)    Hyperlipidemia    Hypertension    history of HTN and on meds til normal- no meds currently   Pneumonia    PONV (postoperative nausea and vomiting)    wasn't sure if it was related to morphine or epidural   Shortness of breath dyspnea    on exertion    Past Surgical History:  Procedure Laterality Date   ABDOMINAL HYSTERECTOMY     for abnormal cytology;  Dr Irven [place], ovaries left   CESAREAN SECTION     GANGLION CYST EXCISION     MASTECTOMY W/ SENTINEL NODE BIOPSY Bilateral 03/14/2015   MASTECTOMY W/ SENTINEL NODE BIOPSY Bilateral 03/14/2015   Procedure: BILATERAL MASTECTOMY WITH LEFT SENTINEL LYMPH NODE BIOPSY;  Surgeon: [name], MD;  Location: [facility];  Service: General;  Laterality: Bilateral;   no colonoscopy     11/17/13 [place] reviewed   ROBOTIC ASSISTED BILATERAL SALPINGO OOPHERECTOMY Bilateral 10/16/2015   Procedure: XI ROBOTIC ASSISTED BILATERAL SALPINGO OOPHORECTOMY ;  Surgeon: Everitt Amber, MD;  Location: [facility];  Service: Gynecology;  Laterality: Bilateral;   TONSILLECTOMY      VARICOSE VEIN SURGERY     as OP X2    Social History   Socioeconomic History   Marital status: Married    Spouse name: Not on file   Number of children: 1   Years of education: Not on file   Highest education level: Not on file  Occupational History   Occupation: Clinical cytogeneticist  Tobacco Use   Smoking status: Former    Packs/day: 1.00    Years: 4.00    Pack years: 4.00    Types: Cigarettes    Quit date: 03/31/1984    Years since quitting: 36.7   Smokeless tobacco: Never   Tobacco comments:    age [age] , up to 1 ppd  Vaping Use   Vaping Use: Never used  Substance and Sexual Activity   Alcohol use: Yes    Alcohol/week: 1.0 standard drink    Types: 1 Glasses of wine per week    Comment: OCCASIONAL   Drug use: No   Sexual activity: Not on file  Other Topics Concern   Not on file  Social History Narrative   Not on file   Social Determinants of Health   Financial Resource Strain: Not on file  Food Insecurity: Not on file  Transportation Needs: Not on file  Physical Activity: Not on file  Stress: Not on file  Social Connections: Not on file    Family History  Problem Relation Age of Onset   Bipolar disorder Mother    Breast cancer Mother    Irritable bowel syndrome Mother    Diabetes Father    Kidney failure Father    Heart failure Father    COPD Father    Heart disease Father    Hyperlipidemia Father    Bipolar disorder Sister    Heart attack Maternal Grandfather        in late 2s   Stroke Maternal Grandmother        in 90s    Review of Systems  Constitutional:  Negative for chills and fever.  Eyes:  Negative for visual disturbance.  Respiratory:  Positive for cough (occ). Negative for shortness of breath and wheezing.   Cardiovascular:  Positive for leg swelling. Negative for chest pain and palpitations.  Gastrointestinal:  Negative for abdominal pain, blood in stool, constipation, diarrhea and nausea.       Gerd flares  Genitourinary:  Positive  for frequency (nocturia). Negative for dysuria.  Musculoskeletal:  Negative for arthralgias and back pain.  Skin:  Negative for color change and rash.  Neurological:  Negative for light-headedness and headaches.  Psychiatric/Behavioral:  Negative for dysphoric mood. The patient is not nervous/anxious.       Objective:   Vitals:   12/11/20 1528  BP: 132/78  Pulse: 87  Temp: 97.9 F (36.6 C)  SpO2:  95%   Filed Weights   12/11/20 1528  Weight: 261 lb (118.4 kg)   Body mass index is 40.88 kg/m.  BP Readings from Last 3 Encounters:  12/11/20 132/78  08/25/19 124/78  05/27/19 122/80    Wt Readings from Last 3 Encounters:  12/11/20 261 lb (118.4 kg)  08/25/19 234 lb (106.1 kg)  05/27/19 238 lb (108 kg)    Depression screen [facility] 2/9 12/11/2020 05/30/2019  Decreased Interest 0 1  Down, Depressed, Hopeless 0 1  PHQ - 2 Score 0 2  Altered sleeping 1 2  Tired, decreased energy 1 1  Change in appetite 0 0  Feeling bad or failure about yourself  0 0  Trouble concentrating 0 0  Moving slowly or fidgety/restless 0 0  Suicidal thoughts 0 0  PHQ-9 Score 2 5  Difficult doing work/chores Not difficult at all -    GAD 7 : Generalized Anxiety Score 12/11/2020 05/30/2019  Nervous, Anxious, on Edge 0 0  Control/stop worrying 0 0  Worry too much - different things 0 0  Trouble relaxing 0 1  Restless 0 0  Easily annoyed or irritable 0 0  Afraid - awful might happen 0 0  Total GAD 7 Score 0 1       Physical Exam Constitutional: She appears well-developed and well-nourished. No distress.  HENT:  Head: Normocephalic and atraumatic.  Right Ear: External ear normal. Normal ear canal and TM Left Ear: External ear normal.  Normal ear canal and TM Mouth/Throat: Oropharynx is clear and moist.  Eyes: Conjunctivae and EOM are normal.  Neck: Neck supple. No tracheal deviation present. No thyromegaly present.  No carotid bruit  Cardiovascular: Normal rate, regular rhythm and normal heart  sounds.   No murmur heard.  No edema. Pulmonary/Chest: Effort normal and breath sounds normal. No respiratory distress. She has no wheezes. She has no rales.  Breast: deferred   Abdominal: Soft. She exhibits no distension. There is no tenderness.  Lymphadenopathy: She has no cervical adenopathy.  Skin: Skin is warm and dry. She is not diaphoretic.  Psychiatric: She has a normal mood and affect. Her behavior is normal.     Lab Results  Component Value Date   WBC 5.7 01/14/2018   HGB 13.9 01/14/2018   HCT 42.4 01/14/2018   PLT 232.0 01/14/2018   GLUCOSE 101 (H) 01/14/2018   CHOL 187 01/14/2018   TRIG 96.0 01/14/2018   HDL 40.20 01/14/2018   LDLDIRECT 141.5 07/07/2012   LDLCALC 128 (H) 01/14/2018   ALT 24 05/06/2018   AST 29 01/14/2018   NA 142 01/14/2018   K 4.2 01/14/2018   CL 104 01/14/2018   CREATININE 0.79 01/14/2018   BUN 14 01/14/2018   CO2 29 01/14/2018   TSH 2.85 01/14/2018   INR 0.9 01/16/2011   HGBA1C 5.5 01/14/2018         Assessment & Plan:   Physical exam: Screening blood work  ordered Exercise  walking Weight  working on weight loss Substance abuse  none   Reviewed recommended immunizations.   Will get shingles at the pharmacy.     Health Maintenance  Topic Date Due   Zoster Vaccines- Shingrix (1 of 2) Never done   COVID-19 Vaccine (3 - Moderna risk series) 07/28/2019   INFLUENZA VACCINE  10/29/2020   Pneumococcal Vaccine 24-81 Years old (3 - PPSV23 or PCV20) 01/15/2023   COLONOSCOPY (Pts 45-87yr Insurance coverage will need to be confirmed)  09/19/2025   TETANUS/TDAP  05/26/2029   Hepatitis C Screening  Completed   HIV Screening  Completed   HPV VACCINES  Aged Out          See Problem List for Assessment and Plan of chronic medical problems.

## 2020-12-10 NOTE — Patient Instructions (Addendum)
Blood work was ordered.     Flu immunization administered today.    Medications changes include :   none  Your prescription(s) have been submitted to your pharmacy. Please take as directed and contact our office if you believe you are having problem(s) with the medication(s).     Please followup in 1 year    Health Maintenance, Female Adopting a healthy lifestyle and getting preventive care are important in promoting health and wellness. Ask your health care provider about: The right schedule for you to have regular tests and exams. Things you can do on your own to prevent diseases and keep yourself healthy. What should I know about diet, weight, and exercise? Eat a healthy diet  Eat a diet that includes plenty of vegetables, fruits, low-fat dairy products, and lean protein. Do not eat a lot of foods that are high in solid fats, added sugars, or sodium. Maintain a healthy weight Body mass index (BMI) is used to identify weight problems. It estimates body fat based on height and weight. Your health care provider can help determine your BMI and help you achieve or maintain a healthy weight. Get regular exercise Get regular exercise. This is one of the most important things you can do for your health. Most adults should: Exercise for at least 150 minutes each week. The exercise should increase your heart rate and make you sweat (moderate-intensity exercise). Do strengthening exercises at least twice a week. This is in addition to the moderate-intensity exercise. Spend less time sitting. Even light physical activity can be beneficial. Watch cholesterol and blood lipids Have your blood tested for lipids and cholesterol at 62 years of age, then have this test every 5 years. Have your cholesterol levels checked more often if: Your lipid or cholesterol levels are high. You are older than 62 years of age. You are at high risk for heart disease. What should I know about cancer  screening? Depending on your health history and family history, you may need to have cancer screening at various ages. This may include screening for: Breast cancer. Cervical cancer. Colorectal cancer. Skin cancer. Lung cancer. What should I know about heart disease, diabetes, and high blood pressure? Blood pressure and heart disease High blood pressure causes heart disease and increases the risk of stroke. This is more likely to develop in people who have high blood pressure readings, are of African descent, or are overweight. Have your blood pressure checked: Every 3-5 years if you are 62-68 years of age. Every year if you are 59 years old or older. Diabetes Have regular diabetes screenings. This checks your fasting blood sugar level. Have the screening done: Once every three years after age 23 if you are at a normal weight and have a low risk for diabetes. More often and at a younger age if you are overweight or have a high risk for diabetes. What should I know about preventing infection? Hepatitis B If you have a higher risk for hepatitis B, you should be screened for this virus. Talk with your health care provider to find out if you are at risk for hepatitis B infection. Hepatitis C Testing is recommended for: Everyone born from [birth year] through 1967. Anyone with known risk factors for hepatitis C. Sexually transmitted infections (STIs) Get screened for STIs, including gonorrhea and chlamydia, if: You are sexually active and are younger than 62 years of age. You are older than 62 years of age and your health care provider tells you that you  are at risk for this type of infection. Your sexual activity has changed since you were last screened, and you are at increased risk for chlamydia or gonorrhea. Ask your health care provider if you are at risk. Ask your health care provider about whether you are at high risk for HIV. Your health care provider may recommend a prescription medicine to  help prevent HIV infection. If you choose to take medicine to prevent HIV, you should first get tested for HIV. You should then be tested every 3 months for as long as you are taking the medicine. Pregnancy If you are about to stop having your period (premenopausal) and you may become pregnant, seek counseling before you get pregnant. Take 400 to 800 micrograms (mcg) of folic acid every day if you become pregnant. Ask for birth control (contraception) if you want to prevent pregnancy. Osteoporosis and menopause Osteoporosis is a disease in which the bones lose minerals and strength with aging. This can result in bone fractures. If you are 53 years old or older, or if you are at risk for osteoporosis and fractures, ask your health care provider if you should: Be screened for bone loss. Take a calcium or vitamin D supplement to lower your risk of fractures. Be given hormone replacement therapy (HRT) to treat symptoms of menopause. Follow these instructions at home: Lifestyle Do not use any products that contain nicotine or tobacco, such as cigarettes, e-cigarettes, and chewing tobacco. If you need help quitting, ask your health care provider. Do not use street drugs. Do not share needles. Ask your health care provider for help if you need support or information about quitting drugs. Alcohol use Do not drink alcohol if: Your health care provider tells you not to drink. You are pregnant, may be pregnant, or are planning to become pregnant. If you drink alcohol: Limit how much you use to 0-1 drink a day. Limit intake if you are breastfeeding. Be aware of how much alcohol is in your drink. In the [place] one drink equals one 12 oz bottle of beer (355 mL), one 5 oz glass of wine (148 mL), or one 1 oz glass of hard liquor (44 mL). General instructions Schedule regular health, dental, and eye exams. Stay current with your vaccines. Tell your health care provider if: You often feel depressed. You  have ever been abused or do not feel safe at home. Summary Adopting a healthy lifestyle and getting preventive care are important in promoting health and wellness. Follow your health care provider's instructions about healthy diet, exercising, and getting tested or screened for diseases. Follow your health care provider's instructions on monitoring your cholesterol and blood pressure. This information is not intended to replace advice given to you by your health care provider. Make sure you discuss any questions you have with your health care provider. Document Revised: 05/25/2020 Document Reviewed: 03/10/2018 Elsevier Patient Education  2022 [employer].

## 2020-12-11 ENCOUNTER — Telehealth: Payer: Self-pay | Admitting: Internal Medicine

## 2020-12-11 ENCOUNTER — Ambulatory Visit (INDEPENDENT_AMBULATORY_CARE_PROVIDER_SITE_OTHER): Payer: 59 | Admitting: Internal Medicine

## 2020-12-11 ENCOUNTER — Encounter: Payer: Self-pay | Admitting: Internal Medicine

## 2020-12-11 VITALS — BP 132/78 | HR 87 | Temp 97.9°F | Ht 67.0 in | Wt 261.0 lb

## 2020-12-11 DIAGNOSIS — J45991 Cough variant asthma: Secondary | ICD-10-CM

## 2020-12-11 DIAGNOSIS — F3289 Other specified depressive episodes: Secondary | ICD-10-CM

## 2020-12-11 DIAGNOSIS — F419 Anxiety disorder, unspecified: Secondary | ICD-10-CM

## 2020-12-11 DIAGNOSIS — Z0000 Encounter for general adult medical examination without abnormal findings: Secondary | ICD-10-CM | POA: Diagnosis not present

## 2020-12-11 DIAGNOSIS — Z23 Encounter for immunization: Secondary | ICD-10-CM | POA: Diagnosis not present

## 2020-12-11 DIAGNOSIS — R600 Localized edema: Secondary | ICD-10-CM

## 2020-12-11 DIAGNOSIS — E782 Mixed hyperlipidemia: Secondary | ICD-10-CM

## 2020-12-11 DIAGNOSIS — R739 Hyperglycemia, unspecified: Secondary | ICD-10-CM | POA: Diagnosis not present

## 2020-12-11 DIAGNOSIS — K219 Gastro-esophageal reflux disease without esophagitis: Secondary | ICD-10-CM | POA: Diagnosis not present

## 2020-12-11 DIAGNOSIS — Z1331 Encounter for screening for depression: Secondary | ICD-10-CM

## 2020-12-11 MED ORDER — FAMOTIDINE 20 MG PO TABS: 20.0000 mg | ORAL_TABLET | ORAL | 3 refills | Status: AC | PRN

## 2020-12-11 MED ORDER — PANTOPRAZOLE SODIUM 40 MG PO TBEC: DELAYED_RELEASE_TABLET | ORAL | 3 refills | Status: AC

## 2020-12-11 MED ORDER — DULOXETINE HCL 60 MG PO CPEP: 60.0000 mg | ORAL_CAPSULE | Freq: Every day | ORAL | 3 refills | Status: AC

## 2020-12-11 MED ORDER — ALBUTEROL SULFATE HFA 108 (90 BASE) MCG/ACT IN AERS: 1.0000 | INHALATION_SPRAY | RESPIRATORY_TRACT | 11 refills | Status: AC | PRN

## 2020-12-11 MED ORDER — FUROSEMIDE 20 MG PO TABS: ORAL_TABLET | ORAL | 3 refills | Status: AC

## 2020-12-11 MED ORDER — DULOXETINE HCL 60 MG PO CPEP: 60.0000 mg | ORAL_CAPSULE | Freq: Every day | ORAL | 3 refills | Status: DC

## 2020-12-11 NOTE — Assessment & Plan Note (Signed)
Chronic Controlled per PHQ9 score and per patient Continue cymbalta 60 mg daily

## 2020-12-11 NOTE — Assessment & Plan Note (Signed)
Chronic Mild, intermittent Uses albuterol inhaler prn - continue Has not needed symbicort - ok to use albuterol only as needed, unless symptoms increase in frequency

## 2020-12-11 NOTE — Assessment & Plan Note (Signed)
Chronic Taking protonix daily - is symptomatic if she misses it Taking famotidine daily prn Can take both medications bid if needed - discussed less medication is better  Working on weight loss

## 2020-12-11 NOTE — Assessment & Plan Note (Signed)
Chronic Check lipid panel, tsh  Diet controlled  Working on weight loss Regular exercise and healthy diet encouraged

## 2020-12-11 NOTE — Assessment & Plan Note (Signed)
Chronic Related to venous insufficiency Continue lasix 20 mg daily

## 2020-12-11 NOTE — Telephone Encounter (Signed)
Pharmacy calling in regarding rx for famotidine (PEPCID) 20 MG tablet  Says they need clarifying frequency for the rx  Please call pharmacy: [facility] [place], [place] - [address] AT [address]  Phone:  [phone] Fax:  [phone]

## 2020-12-11 NOTE — Assessment & Plan Note (Signed)
Chronic Check a1c Low sugar / carb diet Stressed regular exercise  

## 2020-12-11 NOTE — Assessment & Plan Note (Signed)
Chronic Well controlled per GAD7 score and per patient Continue cymbalta 60 mg daily

## 2020-12-12 LAB — COMPREHENSIVE METABOLIC PANEL
ALT: 17 U/L (ref 0–35)
AST: 21 U/L (ref 0–37)
Albumin: 4.2 g/dL (ref 3.5–5.2)
Alkaline Phosphatase: 73 U/L (ref 39–117)
BUN: 24 mg/dL — ABNORMAL HIGH (ref 6–23)
CO2: 25 mEq/L (ref 19–32)
Calcium: 9 mg/dL (ref 8.4–10.5)
Chloride: 103 mEq/L (ref 96–112)
Creatinine, Ser: 0.91 mg/dL (ref 0.40–1.20)
GFR: 67.96 mL/min (ref >60.00)
Glucose, Bld: 91 mg/dL (ref 70–99)
Potassium: 4 mEq/L (ref 3.5–5.1)
Sodium: 137 mEq/L (ref 135–145)
Total Bilirubin: 0.3 mg/dL (ref 0.2–1.2)
Total Protein: 7.2 g/dL (ref 6.0–8.3)

## 2020-12-12 LAB — CBC WITH DIFFERENTIAL/PLATELET
Basophils Absolute: 0.1 10*3/uL (ref 0.0–0.1)
Basophils Relative: 1.1 % (ref 0.0–3.0)
Eosinophils Absolute: 0.3 10*3/uL (ref 0.0–0.7)
Eosinophils Relative: 3.7 % (ref 0.0–5.0)
HCT: 35.1 % — ABNORMAL LOW (ref 36.0–46.0)
Hemoglobin: 11.2 g/dL — ABNORMAL LOW (ref 12.0–15.0)
Lymphocytes Relative: 18.6 % (ref 12.0–46.0)
Lymphs Abs: 1.3 10*3/uL (ref 0.7–4.0)
MCHC: 31.9 g/dL (ref 30.0–36.0)
MCV: 75.6 fl — ABNORMAL LOW (ref 78.0–100.0)
Monocytes Absolute: 0.4 10*3/uL (ref 0.1–1.0)
Monocytes Relative: 5.5 % (ref 3.0–12.0)
Neutro Abs: 5 10*3/uL (ref 1.4–7.7)
Neutrophils Relative %: 71.1 % (ref 43.0–77.0)
Platelets: 242 10*3/uL (ref 150.0–400.0)
RBC: 4.65 Mil/uL (ref 3.87–5.11)
RDW: 15.9 % — ABNORMAL HIGH (ref 11.5–15.5)
WBC: 7 10*3/uL (ref 4.0–10.5)

## 2020-12-12 LAB — LIPID PANEL
Cholesterol: 196 mg/dL (ref 0–200)
HDL: 47.6 mg/dL (ref >39.00)
LDL Cholesterol: 118 mg/dL — ABNORMAL HIGH (ref 0–99)
NonHDL: 148.22
Total CHOL/HDL Ratio: 4
Triglycerides: 152 mg/dL — ABNORMAL HIGH (ref 0.0–149.0)
VLDL: 30.4 mg/dL (ref 0.0–40.0)

## 2020-12-12 LAB — HEMOGLOBIN A1C: Hgb A1c MFr Bld: 5.8 % (ref 4.6–6.5)

## 2020-12-12 LAB — TSH: TSH: 4.07 u[IU]/mL (ref 0.35–5.50)

## 2020-12-12 NOTE — Addendum Note (Signed)
Addended by: Marcina Millard on: 12/12/2020 07:52 AM   Modules accepted: Orders

## 2020-12-13 NOTE — Telephone Encounter (Signed)
Spoke with pharmacist today.
